# Patient Record
Sex: Female | Born: 1965 | Race: Black or African American | Hispanic: No | Marital: Single | Smoking: Never smoker
Health system: Southern US, Community
[De-identification: ages and names within clinical notes are randomized; demographics above are authoritative.]

## PROBLEM LIST (undated history)

## (undated) DIAGNOSIS — K219 Gastro-esophageal reflux disease without esophagitis: Secondary | ICD-10-CM

## (undated) DIAGNOSIS — M0520 Rheumatoid vasculitis with rheumatoid arthritis of unspecified site: Secondary | ICD-10-CM

## (undated) DIAGNOSIS — J45909 Unspecified asthma, uncomplicated: Secondary | ICD-10-CM

## (undated) HISTORY — PX: ABDOMINAL HYSTERECTOMY: SHX81

---

## 2000-09-14 ENCOUNTER — Ambulatory Visit (HOSPITAL_COMMUNITY): Admission: RE | Admit: 2000-09-14 | Discharge: 2000-09-14 | Payer: Self-pay | Admitting: Rheumatology

## 2002-06-18 ENCOUNTER — Encounter: Payer: Self-pay | Admitting: Emergency Medicine

## 2002-06-18 ENCOUNTER — Emergency Department (HOSPITAL_COMMUNITY): Admission: EM | Admit: 2002-06-18 | Discharge: 2002-06-18 | Payer: Self-pay | Admitting: Emergency Medicine

## 2003-09-10 ENCOUNTER — Other Ambulatory Visit: Admission: RE | Admit: 2003-09-10 | Discharge: 2003-09-10 | Payer: Self-pay | Admitting: Family Medicine

## 2003-09-13 ENCOUNTER — Ambulatory Visit (HOSPITAL_COMMUNITY): Admission: RE | Admit: 2003-09-13 | Discharge: 2003-09-13 | Payer: Self-pay | Admitting: Family Medicine

## 2003-09-14 ENCOUNTER — Ambulatory Visit (HOSPITAL_COMMUNITY): Admission: RE | Admit: 2003-09-14 | Discharge: 2003-09-14 | Payer: Self-pay | Admitting: Family Medicine

## 2003-12-11 ENCOUNTER — Encounter: Admission: RE | Admit: 2003-12-11 | Discharge: 2003-12-11 | Payer: Self-pay | Admitting: Rheumatology

## 2004-03-03 ENCOUNTER — Ambulatory Visit (HOSPITAL_COMMUNITY): Admission: RE | Admit: 2004-03-03 | Discharge: 2004-03-03 | Payer: Self-pay | Admitting: Obstetrics and Gynecology

## 2004-04-16 ENCOUNTER — Encounter: Admission: RE | Admit: 2004-04-16 | Discharge: 2004-04-16 | Payer: Self-pay | Admitting: Obstetrics and Gynecology

## 2004-04-18 ENCOUNTER — Encounter (INDEPENDENT_AMBULATORY_CARE_PROVIDER_SITE_OTHER): Payer: Self-pay | Admitting: Specialist

## 2004-04-18 ENCOUNTER — Inpatient Hospital Stay (HOSPITAL_COMMUNITY): Admission: RE | Admit: 2004-04-18 | Discharge: 2004-04-20 | Payer: Self-pay | Admitting: Obstetrics and Gynecology

## 2004-05-03 ENCOUNTER — Inpatient Hospital Stay (HOSPITAL_COMMUNITY): Admission: AD | Admit: 2004-05-03 | Discharge: 2004-05-03 | Payer: Self-pay | Admitting: Obstetrics and Gynecology

## 2005-04-16 ENCOUNTER — Other Ambulatory Visit: Admission: RE | Admit: 2005-04-16 | Discharge: 2005-04-16 | Payer: Self-pay | Admitting: Obstetrics and Gynecology

## 2005-11-10 ENCOUNTER — Encounter: Admission: RE | Admit: 2005-11-10 | Discharge: 2005-11-10 | Payer: Self-pay | Admitting: Rheumatology

## 2006-02-09 ENCOUNTER — Ambulatory Visit: Payer: Self-pay | Admitting: Family Medicine

## 2006-05-21 ENCOUNTER — Other Ambulatory Visit: Admission: RE | Admit: 2006-05-21 | Discharge: 2006-05-21 | Payer: Self-pay | Admitting: Obstetrics and Gynecology

## 2006-09-21 ENCOUNTER — Ambulatory Visit: Payer: Self-pay | Admitting: Family Medicine

## 2006-09-23 ENCOUNTER — Ambulatory Visit: Payer: Self-pay | Admitting: Family Medicine

## 2007-01-18 ENCOUNTER — Ambulatory Visit: Payer: Self-pay | Admitting: Family Medicine

## 2007-07-28 DIAGNOSIS — M159 Polyosteoarthritis, unspecified: Secondary | ICD-10-CM | POA: Insufficient documentation

## 2007-08-25 ENCOUNTER — Encounter (INDEPENDENT_AMBULATORY_CARE_PROVIDER_SITE_OTHER): Payer: Self-pay | Admitting: Family Medicine

## 2007-10-06 ENCOUNTER — Encounter (INDEPENDENT_AMBULATORY_CARE_PROVIDER_SITE_OTHER): Payer: Self-pay | Admitting: Obstetrics and Gynecology

## 2007-10-06 ENCOUNTER — Ambulatory Visit (HOSPITAL_COMMUNITY): Admission: RE | Admit: 2007-10-06 | Discharge: 2007-10-06 | Payer: Self-pay | Admitting: Obstetrics and Gynecology

## 2007-10-14 ENCOUNTER — Telehealth (INDEPENDENT_AMBULATORY_CARE_PROVIDER_SITE_OTHER): Payer: Self-pay | Admitting: Family Medicine

## 2007-11-07 ENCOUNTER — Ambulatory Visit: Payer: Self-pay | Admitting: Family Medicine

## 2007-11-09 ENCOUNTER — Ambulatory Visit: Payer: Self-pay | Admitting: Family Medicine

## 2007-12-07 ENCOUNTER — Encounter (INDEPENDENT_AMBULATORY_CARE_PROVIDER_SITE_OTHER): Payer: Self-pay | Admitting: Family Medicine

## 2008-02-07 ENCOUNTER — Encounter (INDEPENDENT_AMBULATORY_CARE_PROVIDER_SITE_OTHER): Payer: Self-pay | Admitting: Family Medicine

## 2008-03-08 ENCOUNTER — Encounter (INDEPENDENT_AMBULATORY_CARE_PROVIDER_SITE_OTHER): Payer: Self-pay | Admitting: Family Medicine

## 2008-06-11 ENCOUNTER — Encounter: Admission: RE | Admit: 2008-06-11 | Discharge: 2008-06-11 | Payer: Self-pay | Admitting: Rheumatology

## 2008-06-11 ENCOUNTER — Encounter (INDEPENDENT_AMBULATORY_CARE_PROVIDER_SITE_OTHER): Payer: Self-pay | Admitting: Family Medicine

## 2008-06-27 ENCOUNTER — Encounter: Admission: RE | Admit: 2008-06-27 | Discharge: 2008-06-27 | Payer: Self-pay | Admitting: Rheumatology

## 2008-09-19 ENCOUNTER — Encounter (INDEPENDENT_AMBULATORY_CARE_PROVIDER_SITE_OTHER): Payer: Self-pay | Admitting: Family Medicine

## 2008-09-19 ENCOUNTER — Encounter: Admission: RE | Admit: 2008-09-19 | Discharge: 2008-09-19 | Payer: Self-pay | Admitting: Rheumatology

## 2009-01-08 ENCOUNTER — Encounter (INDEPENDENT_AMBULATORY_CARE_PROVIDER_SITE_OTHER): Payer: Self-pay | Admitting: Family Medicine

## 2009-07-12 ENCOUNTER — Encounter: Payer: Self-pay | Admitting: Physician Assistant

## 2009-07-12 ENCOUNTER — Telehealth: Payer: Self-pay | Admitting: Physician Assistant

## 2009-07-15 ENCOUNTER — Encounter (INDEPENDENT_AMBULATORY_CARE_PROVIDER_SITE_OTHER): Payer: Self-pay | Admitting: *Deleted

## 2009-09-12 ENCOUNTER — Encounter: Payer: Self-pay | Admitting: Physician Assistant

## 2009-09-15 ENCOUNTER — Encounter: Payer: Self-pay | Admitting: Physician Assistant

## 2009-09-15 DIAGNOSIS — M069 Rheumatoid arthritis, unspecified: Secondary | ICD-10-CM | POA: Insufficient documentation

## 2009-12-12 ENCOUNTER — Encounter: Payer: Self-pay | Admitting: Physician Assistant

## 2010-02-04 ENCOUNTER — Telehealth: Payer: Self-pay | Admitting: Physician Assistant

## 2010-02-04 ENCOUNTER — Ambulatory Visit: Payer: Self-pay | Admitting: Physician Assistant

## 2010-02-04 DIAGNOSIS — R059 Cough, unspecified: Secondary | ICD-10-CM | POA: Insufficient documentation

## 2010-02-04 DIAGNOSIS — R05 Cough: Secondary | ICD-10-CM

## 2010-02-12 ENCOUNTER — Encounter: Payer: Self-pay | Admitting: Physician Assistant

## 2010-02-12 LAB — CONVERTED CEMR LAB
ALT: 18 units/L
AST: 24 units/L
Albumin: 3.9 g/dL
Alkaline Phosphatase: 53 units/L
Anion Gap: 11.2
BUN: 9 mg/dL
CO2: 26 meq/L
Chloride: 105 meq/L
Creatinine, Ser: 0.7 mg/dL
GFR calc Af Amer: 115.51 mL/min
GFR calc non Af Amer: 91.33 mL/min
Glucose, Bld: 101 mg/dL
HCT: 36.7 %
Hemoglobin: 12.2 g/dL
MCV: 92.5 fL
Platelets: 314 10*3/uL
Potassium: 4.2 meq/L
RBC: 3.97 M/uL
RDW: 12.3 %
Sed Rate: 45 mm/hr
Sodium: 138 meq/L
Total Bilirubin: 0.6 mg/dL
Total Protein: 8.1 g/dL
Vit D, 25-Hydroxy: 9 ng/mL
WBC: 4.7 10*3/uL

## 2010-02-16 ENCOUNTER — Encounter: Payer: Self-pay | Admitting: Physician Assistant

## 2010-02-16 DIAGNOSIS — E559 Vitamin D deficiency, unspecified: Secondary | ICD-10-CM | POA: Insufficient documentation

## 2010-03-13 ENCOUNTER — Encounter (INDEPENDENT_AMBULATORY_CARE_PROVIDER_SITE_OTHER): Payer: Self-pay | Admitting: *Deleted

## 2010-03-13 ENCOUNTER — Telehealth: Payer: Self-pay | Admitting: Physician Assistant

## 2010-04-09 ENCOUNTER — Encounter: Payer: Self-pay | Admitting: Physician Assistant

## 2010-04-30 ENCOUNTER — Telehealth: Payer: Self-pay | Admitting: Physician Assistant

## 2010-05-06 ENCOUNTER — Encounter (INDEPENDENT_AMBULATORY_CARE_PROVIDER_SITE_OTHER): Payer: Self-pay | Admitting: *Deleted

## 2010-05-20 ENCOUNTER — Ambulatory Visit: Payer: Self-pay | Admitting: Physician Assistant

## 2010-05-20 DIAGNOSIS — M76899 Other specified enthesopathies of unspecified lower limb, excluding foot: Secondary | ICD-10-CM | POA: Insufficient documentation

## 2010-07-09 ENCOUNTER — Encounter: Payer: Self-pay | Admitting: Physician Assistant

## 2010-10-08 ENCOUNTER — Encounter (INDEPENDENT_AMBULATORY_CARE_PROVIDER_SITE_OTHER): Payer: Self-pay | Admitting: Nurse Practitioner

## 2010-10-21 ENCOUNTER — Encounter (INDEPENDENT_AMBULATORY_CARE_PROVIDER_SITE_OTHER): Payer: Self-pay | Admitting: Nurse Practitioner

## 2010-11-07 ENCOUNTER — Encounter (INDEPENDENT_AMBULATORY_CARE_PROVIDER_SITE_OTHER): Payer: Self-pay | Admitting: Nurse Practitioner

## 2010-11-16 ENCOUNTER — Encounter: Payer: Self-pay | Admitting: Obstetrics and Gynecology

## 2010-11-16 ENCOUNTER — Encounter: Payer: Self-pay | Admitting: Occupational Therapy

## 2010-11-25 NOTE — Miscellaneous (Signed)
  Clinical Lists Changes  Observations: Added new observation of VIT D 25-OH: 9.0 ng/mL (02/12/2010 22:06) Added new observation of ESR: 45 mm/hr (02/12/2010 22:06) Added new observation of ALBUMIN: 3.9 g/dL (16/07/9603 54:09) Added new observation of PROTEIN, TOT: 8.1 g/dL (81/19/1478 29:56) Added new observation of SGPT (ALT): 18 units/L (02/12/2010 22:06) Added new observation of SGOT (AST): 24 units/L (02/12/2010 22:06) Added new observation of ALK PHOS: 53 units/L (02/12/2010 22:06) Added new observation of BILI TOTAL: 0.6 mg/dL (21/30/8657 84:69) Added new observation of GFRAA: 115.51 mL/min (02/12/2010 22:06) Added new observation of GFR: 91.33 mL/min (02/12/2010 22:06) Added new observation of CREATININE: 0.70 mg/dL (62/95/2841 32:44) Added new observation of BUN: 9 mg/dL (10/28/7251 66:44) Added new observation of BG RANDOM: 101 mg/dL (03/47/4259 56:38) Added new observation of ANION GAP: 11.2  (02/12/2010 22:06) Added new observation of CO2 PLSM/SER: 26 meq/L (02/12/2010 22:06) Added new observation of CL SERUM: 105 meq/L (02/12/2010 22:06) Added new observation of K SERUM: 4.2 meq/L (02/12/2010 22:06) Added new observation of NA: 138 meq/L (02/12/2010 22:06) Added new observation of PLATELETK/UL: 314 K/uL (02/12/2010 22:06) Added new observation of RDW: 12.3 % (02/12/2010 22:06) Added new observation of MCV: 92.5 fL (02/12/2010 22:06) Added new observation of HCT: 36.7 % (02/12/2010 22:06) Added new observation of HGB: 12.2 g/dL (75/64/3329 51:88) Added new observation of RBC M/UL: 3.97 M/uL (02/12/2010 22:06) Added new observation of WBC COUNT: 4.7 10*3/microliter (02/12/2010 22:06)  Appended Document:     Clinical Lists Changes  Observations: Added new observation of LDL: 125 mg/dL (41/66/0630 16:01) Added new observation of HDL: 39 mg/dL (09/32/3557 32:20) Added new observation of TRIGLYC TOT: 86 mg/dL (25/42/7062 37:62) Added new observation of CHOLESTEROL: 184  mg/dL (83/15/1761 60:73)

## 2010-11-25 NOTE — Assessment & Plan Note (Signed)
Summary: Right hip pain (bursitis)   Vital Signs:  Patient profile:   45 year old female Height:      65 inches Weight:      208 pounds BMI:     34.74 Temp:     98.3 degrees F oral Pulse rate:   84 / minute Pulse rhythm:   regular Resp:     18 per minute BP sitting:   129 / 82  (left arm) Cuff size:   regular  Vitals Entered By: [name] (May 20, 2010 3:03 PM) CC: pt is here swelling in hips down to feet...Marland KitchenMarland Kitchen pt says rheuma gave her an ointment but not helping.... ibuprofen dose has increased for pain...  pt says she would like fluid pills... Is Patient Diabetic? No Pain Assessment Patient in pain? no       Does patient need assistance? Functional Status Self care Ambulation Normal   Primary Care Provider:  Tereso Newcomer PA-C  CC:  pt is here swelling in hips down to feet...Marland KitchenMarland Kitchen pt says rheuma gave her an ointment but not helping.... ibuprofen dose has increased for pain...  pt says she would like fluid pills....  History of Present Illness: Here for right hip pain.  Saw Dr. Nickola Major her rheumatologist several weeks ago and was given Ibuprofen 800 mg three times a day and Voltaren gel.  The notes state that she would give her an injection if it did not respond.  Kathy [name] states that her pain continues and radiates down to her knee.  She denies any pedal edema.  Denies any dyspnea.  She gets her MTX labs with Dr. Nickola Major.  She is concerned about getting a steroid injection.  Thinks it will cause side effects like she had with oral prednisone.  She is inquiring whether or not she should take fluid pills.  Current Medications (verified): 1)  Methotrexate 2.5 Mg Tabs (Methotrexate Sodium) .... Take Six Tabs By Mouth Weekly 2)  Lab Draw .... Please Draw:  Lipids, Vitamin D and Fax To [facility] 3)  Cetirizine Hcl 10 Mg Tabs (Cetirizine Hcl) .... Take 1 Tablet By Mouth Once A Day As Needed 4)  Tessalon Perles 100 Mg Caps (Benzonatate) .... Take 1 Tablet By Mouth Three  Times A Day As Needed For Cough 5)  Ergocalciferol 50000 Unit Caps (Ergocalciferol) .... Take 1 Cap Once A Week For 12 Weeks.  Allergies (verified): 1)  ! Aspirin  Physical Exam  General:  alert, well-developed, and well-nourished.   Head:  normocephalic and atraumatic.   Lungs:  normal breath sounds.   Heart:  normal rate and regular rhythm.   Msk:  + pain over right greater trochanter with palp  Extremities:  no pedal edema  Neurologic:  alert & oriented X3 and cranial nerves II-XII intact.   Psych:  normally interactive.     Impression & Recommendations:  Problem # 1:  VITAMIN D DEFICIENCY (ICD-268.9) never got Vitamin D reprint Rx and check levels in 3 mos  Problem # 2:  TROCHANTERIC BURSITIS, RIGHT (ICD-726.5) agree with Dr. Nickola Major she should have an injection for treatment I spent some time today explaining that the steroid injection will not cause the side effects she is worried about I will give her naproxen to use two times a day to see if this is better than the ibuprofen otherwise, I rec she f/u with Dr.Hawkes I see no reason for her to take a diuretic  Complete Medication List: 1)  Methotrexate 2.5  Mg Tabs (Methotrexate sodium) .... Take six tabs by mouth weekly 2)  Cetirizine Hcl 10 Mg Tabs (Cetirizine hcl) .... Take 1 tablet by mouth once a day as needed 3)  Ergocalciferol 50000 Unit Caps (Ergocalciferol) .... Take 1 cap once a week for 12 weeks. 4)  Omeprazole 20 Mg Cpdr (Omeprazole) .... Take 1 tablet by mouth once a day 5)  Naproxen 500 Mg Tabs (Naproxen) .... Take 1 tablet by mouth two times a day with food for 2 weeks  Patient Instructions: 1)  Take Vitamin D (ergocalciferol) once a week for 12 weeks. 2)  Please schedule a follow-up appointment in 3-4 months with the lab for Vitamin D level.  3)  Stop the Ibuprofen and take Naprosyn (Naproxen) 500 mg two times a day for 2 weeks.  Take with food. 4)  I suggest you schedule a follow up with Dr. Nickola Major and  consider getting the bursa injected.  This is often the most effective treatment for bursitis.  The side effects of altering mood and weight gain are not felt with steroid injections because the medicine is retained mainly in the area it is injected. Prescriptions: NAPROXEN 500 MG TABS (NAPROXEN) Take 1 tablet by mouth two times a day with food for 2 weeks  #30 x 0   Entered and Authorized by:   Tereso Newcomer PA-C   Signed by:   Tereso Newcomer PA-C on 05/20/2010   Method used:   Print then Give to Patient   RxID:   [id number] ERGOCALCIFEROL 50000 UNIT CAPS (ERGOCALCIFEROL) Take 1 cap once a week for 12 weeks.  #12 x 0   Entered and Authorized by:   Tereso Newcomer PA-C   Signed by:   Tereso Newcomer PA-C on 05/20/2010   Method used:   Reprint   RxID:   [id number]

## 2010-11-25 NOTE — Letter (Signed)
Summary: RHEUMATOID NOTES  RHEUMATOID NOTES   Imported By: Arta Bruce 10/31/2009 15:34:57  _____________________________________________________________________  External Attachment:    Type:   Image     Comment:   External Document

## 2010-11-25 NOTE — Letter (Signed)
Summary: RHEMATOLOGY NOTES  RHEMATOLOGY NOTES   Imported By: Arta Bruce 02/18/2010 14:27:45  _____________________________________________________________________  External Attachment:    Type:   Image     Comment:   External Document

## 2010-11-25 NOTE — Letter (Signed)
Summary: *HSN Results Follow up  [facility]  [address] [place], [place] [address]   Phone: [phone]  Fax: [phone]      03/13/2010   Kathy [name] [address] [place], [place]  [address]   Dear  [name],                            ____S.Drinkard,FNP   ____D. Gore,FNP       ____B. McPherson,MD   ____V. Rankins,MD    ____E. Mulberry,MD    ____N. Daphine Deutscher, FNP  ____D. Reche Dixon, MD    ____K. Philipp Deputy, MD    ____Other     This letter is to inform you that your recent test(s):  _______Pap Smear    _______Lab Test     _______X-ray    _______ is within acceptable limits  _______ requires a medication change  ____X___ requires a follow-up lab visit  _______ requires a follow-up visit with your provider   Comments:  We have been trying to reach you.  Please give me a call at your earliest convenience.       _________________________________________________________ If you have any questions, please contact our office                     Sincerely,  [name] [facility]

## 2010-11-25 NOTE — Assessment & Plan Note (Signed)
Summary: NEEDS GYN REFERRAL/[facility]//[place]   Vital Signs:  Patient profile:   45 year old female Height:      65 inches Weight:      211 pounds BMI:     35.24 Temp:     98.5 degrees F oral Pulse rate:   80 / minute Pulse rhythm:   regular Resp:     18 per minute BP sitting:   118 / 82  (left arm) Cuff size:   regular  Vitals Entered By: [name] (February 04, 2010 4:01 PM) CC: pt wants a referral GYN.Marland Kitchen   pt wants a med for acid reflux... Is Patient Diabetic? No Pain Assessment Patient in pain? no       Does patient need assistance? Functional Status Self care Ambulation Normal   Primary Care Provider:  Tereso Newcomer PA-C  CC:  pt wants a referral GYN.Marland Kitchen   pt wants a med for acid reflux....  History of Present Illness: Here to re-establish. Has been following with Dr. Nickola Major for her RA.  She is on MTX.  Dr. Nickola Major draws labs for her MTX and last had blood drawn last week.  She needs referral back to [facility] for f/u.  Also due for mammogram.  Has FHx breast cancer.  Cough:  She is c/o cough for about 2 weeks.  No fever.  No production.  No sore throat or otalgia.  No sensation of postnasal drip.  No sneezing.  No congestion.  No dyspnea.  She does note some stress incontinence with cough at times.  Had URI.  Cough lingered after.    Habits & Providers  Alcohol-Tobacco-Diet     Tobacco Status: never  Exercise-Depression-Behavior     Drug Use: no  Current Medications (verified): 1)  Ibuprofen 800 Mg  Tabs (Ibuprofen) 2)  Methotrexate 2.5 Mg Tabs (Methotrexate Sodium) .... Take Six Tabs By Mouth Weekly  Allergies (verified): 1)  ! Aspirin  Past History:  Past Medical History: Reviewed history from 09/15/2009 and no changes required. Rheumatoid arthritis   a.  f/u by Dr. Lennox Pippins - now Dr. Zenovia Jordan at [place]; intol. to MTX; no response to Gold or Plaquenil;        h/o prednisone tx and intermittent short courses; takes NSAIDs; last seen by  rheum. 9.14.2010   b.  Patient agreed to trying methotrexate again at 09/09/2009 visit with Dr. Nickola Major  Past Surgical History: h/o DUB with hysterectomy 2008 ovarian cyst surgery in 2010  Family History: Family History Diabetes 1st degree relative - mom Heart problems - mom Family History Breast cancer 1st degree relative <50 - sister Family History Hypertension - mom Lupus - sister Hepatic CA - dad No colon cancer.  Social History: Disabled from RA Single 1 child (adult now) Never Smoked Alcohol use-no Drug use-no Smoking Status:  never Drug Use:  no  Review of Systems  The patient denies fever, chest pain, syncope, dyspnea on exertion, melena, hematochezia, and hematuria.         See HPI  Physical Exam  General:  alert, well-developed, and well-nourished.   Head:  normocephalic and atraumatic.   Eyes:  pupils equal, pupils round, and pupils reactive to light.   Ears:  R ear normal and L ear normal.   Nose:  no external deformity.   Mouth:  pharynx pink and moist.   Neck:  supple.   Lungs:  normal breath sounds, no crackles, and no wheezes.   Heart:  normal rate  and regular rhythm.   Msk:  rheumatoid nodule noted right wrist some ulnar deviation noted of metacarpals on right  Neurologic:  alert & oriented X3 and cranial nerves II-XII intact.   Psych:  normally interactive.     Impression & Recommendations:  Problem # 1:  Preventive Health Care (ICD-V70.0)  needs referral back to Dr. Osborn Coho for Gyn for her medicaid had Td in 2008 up to date on pneumovax will ask to get Vit D and Lipids with next blood draw at rheumatologist's office   Orders: Gynecologic Referral (Gyn) Mammogram (Screening) (Mammo)  Problem # 2:  COUGH (ICD-786.2) prob airway hyperreactivity from recent URI antihistamine proventil as needed tessalon perles as needed f/u as needed kegels given for stress incontinence  Problem # 3:  RHEUMATOID ARTHRITIS (ICD-714.0) f/u  with Dr. Nickola Major  The following medications were removed from the medication list:    Ibuprofen 800 Mg Tabs (Ibuprofen) Her updated medication list for this problem includes:    Methotrexate 2.5 Mg Tabs (Methotrexate sodium) .Marland Kitchen... Take six tabs by mouth weekly  Complete Medication List: 1)  Methotrexate 2.5 Mg Tabs (Methotrexate sodium) .... Take six tabs by mouth weekly 2)  Lab Draw  .... Please draw:  lipids, vitamin d and [facility] to [facility] 3)  Cetirizine Hcl 10 Mg Tabs (Cetirizine hcl) .... Take 1 tablet by mouth once a day as needed 4)  Tessalon Perles 100 Mg Caps (Benzonatate) .... Take 1 tablet by mouth three times a day as needed for cough 5)  Proventil Hfa 108 (90 Base) Mcg/act Aers (Albuterol sulfate) .Marland Kitchen.. 1-2 puffs every 4-6 hours as needed  Patient Instructions: 1)  Go to your next blood draw with Dr. Nickola Major fasting for cholesterol check.  I will also check your Vitamin D.   2)  Do not eat or drink anything after midnight except water. 3)  Someone will call you about the referral to Dr. Su Hilt. 4)  Use the cetirizine once daily for 2-3 days.  Use the proventil as needed and the tessalon perles as needed. 5)  See kegel exercises. Prescriptions: PROVENTIL HFA 108 (90 BASE) MCG/ACT AERS (ALBUTEROL SULFATE) 1-2 puffs every 4-6 hours as needed  #1 x 0   Entered and Authorized by:   Tereso Newcomer PA-C   Signed by:   Tereso Newcomer PA-C on 02/04/2010   Method used:   Print then Give to Patient   RxID:   [id number] TESSALON PERLES 100 MG CAPS (BENZONATATE) Take 1 tablet by mouth three times a day as needed for cough  #30 x 1   Entered and Authorized by:   Tereso Newcomer PA-C   Signed by:   Tereso Newcomer PA-C on 02/04/2010   Method used:   Print then Give to Patient   RxID:   [id number] CETIRIZINE HCL 10 MG TABS (CETIRIZINE HCL) Take 1 tablet by mouth once a day as needed  #30 x 1   Entered and Authorized by:   Tereso Newcomer PA-C   Signed by:   Tereso Newcomer PA-C on  02/04/2010   Method used:   Print then Give to Patient   RxID:   [id number] LAB DRAW please draw:  Lipids, Vitamin D and fax to Tereso Newcomer, PA-C  #0 x 0   Entered and Authorized by:   Tereso Newcomer PA-C   Signed by:   Tereso Newcomer PA-C on 02/04/2010   Method used:   Print then Give to Patient   RxID:  [id number]    Appended Document: NEEDS GYN REFERRAL/[facility] PAT[facility]//[facility] LDL 125 . . . goal > 130 HDL 39 . . . have patient start Fish Oil 1000 mg 2 caps once daily. Repeat fasting Lipids and LFTs in 6 mos. Vit D is low and needs replacement Repeat 25 Hydroxyvitamin D level in 3 mos.    Left message on answering machine for pt to call back.Marland KitchenMarland KitchenMarland KitchenArmenia Shannon  February 17, 2010 3:43 PM  Pt notified,  she uses TEFL teacher........... Elmarie Shiley McCoy CMA  February 17, 2010 4:06 PM   Clinical Lists Changes  Problems: Added new problem of VITAMIN D DEFICIENCY (ICD-268.9) - Signed Medications: Added new medication of ERGOCALCIFEROL 50000 UNIT CAPS (ERGOCALCIFEROL) Take 1 cap once a week for 12 weeks. - Signed Rx of ERGOCALCIFEROL 50000 UNIT CAPS (ERGOCALCIFEROL) Take 1 cap once a week for 12 weeks.;  #12 x 0;  Signed;  Entered by: Tereso Newcomer PA-C;  Authorized by: Tereso Newcomer PA-C;  Method used: Print then Give to Patient    Prescriptions: ERGOCALCIFEROL 50000 UNIT CAPS (ERGOCALCIFEROL) Take 1 cap once a week for 12 weeks.  #12 x 0   Entered and Authorized by:   Tereso Newcomer PA-C   Signed by:   Tereso Newcomer PA-C on 02/16/2010   Method used:   Print then Give to Patient   RxID:   [id number]      Impression & Recommendations:  Problem # 1:  VITAMIN D DEFICIENCY (ICD-268.9)  Complete Medication List: 1)  Methotrexate 2.5 Mg Tabs (Methotrexate sodium) .... Take six tabs by mouth weekly 2)  Lab Draw  .... Please draw:  lipids, vitamin d and fax to [facility] 3)  Cetirizine Hcl 10 Mg Tabs (Cetirizine hcl) .... Take 1 tablet by mouth once a day as  needed 4)  Tessalon Perles 100 Mg Caps (Benzonatate) .... Take 1 tablet by mouth three times a day as needed for cough 5)  Proventil Hfa 108 (90 Base) Mcg/act Aers (Albuterol sulfate) .Marland Kitchen.. 1-2 puffs every 4-6 hours as needed 6)  Ergocalciferol 50000 Unit Caps (Ergocalciferol) .... Take 1 cap once a week for 12 weeks.

## 2010-11-25 NOTE — Letter (Signed)
Summary: *HSN Results Follow up  [facility]  [address] [place], [place] [address]   Phone: [phone]  Fax: [phone]      05/06/2010   Kathy [name] [address] [place], [place]  [address]   Dear  [name],                            ____S.Drinkard,FNP   ____D. Gore,FNP       ____B. McPherson,MD   ____V. Rankins,MD    ____E. Mulberry,MD    ____N. Daphine Deutscher, FNP  ____D. Reche Dixon, MD    ____K. Philipp Deputy, MD    ____Other     This letter is to inform you that your recent test(s):  _______Pap Smear    _______Lab Test     _______X-ray    _______ is within acceptable limits  _______ requires a medication change  _______ requires a follow-up lab visit  __X_____ requires a follow-up visit with your provider   Comments:We have been trying to reach you.  Please give the office a call back at your earliest convenience.       _________________________________________________________ If you have any questions, please contact our office                     Sincerely,  Kathy [name] [facility]

## 2010-11-25 NOTE — Letter (Signed)
Summary: RHEUMATOLOGY NOTES  RHEUMATOLOGY NOTES   Imported By: Arta Bruce 05/14/2010 11:06:31  _____________________________________________________________________  External Attachment:    Type:   Image     Comment:   External Document

## 2010-11-25 NOTE — Progress Notes (Signed)
Summary: Gyn referral  Phone Note Outgoing Call   Summary of Call: Needs referral back to Gyn for f/u. Referral in system.  Dr. Osborn Coho. Initial call taken by: Brynda Rim,  February 04, 2010 5:08 PM

## 2010-11-25 NOTE — Letter (Signed)
Summary: [facility] //OFFICE VISIT  [facility] //OFFICE VISIT   Imported By: Arta Bruce 08/01/2010 11:47:10  _____________________________________________________________________  External Attachment:    Type:   Image     Comment:   External Document

## 2010-11-25 NOTE — Progress Notes (Signed)
Summary: fyi  Phone Note Outgoing Call   Summary of Call: med vitamin d called in to pharmacy but pt never picked up Initial call taken by: [name],  Mar 13, 2010 11:42 AM     Appended Document: Orders Update Please remind her about prescription and need to recheck Vit D levels in 3 mos. Tereso Newcomer PA-C  Mar 13, 2010 11:55 AM  number is disconnected.Marland KitchenMarland KitchenMarland KitchenMarland Kitchen will mail letter... [name]  Mar 13, 2010 3:19 PM   Clinical Lists Changes

## 2010-11-25 NOTE — Letter (Signed)
Summary: RHEUMATOID NOTES  RHEUMATOID NOTES   Imported By: Arta Bruce 05/05/2010 16:19:50  _____________________________________________________________________  External Attachment:    Type:   Image     Comment:   External Document

## 2010-11-25 NOTE — Letter (Signed)
[facility]: [facility]   Imported By: Arta Bruce 04/14/2010 16:26:38  _____________________________________________________________________  External Attachment:    Type:   Image     Comment:   External Document

## 2010-11-25 NOTE — Letter (Signed)
Summary: RHEUMATOLOGY NOTES  RHEUMATOLOGY NOTES   Imported By: Arta Bruce 02/19/2010 12:41:53  _____________________________________________________________________  External Attachment:    Type:   Image     Comment:   External Document

## 2010-11-25 NOTE — Progress Notes (Signed)
Summary: NEEDS FLUID MEDICATIONS  Phone Note Call from Patient Call back at Home Phone [phone]   Summary of Call: Kathy [name] PT. MS Murtaugh CALLED, BECAUSE HER RHUMATOLOGIST, DR HAWKS WANTS HER TO BE ON FLUID PILLS, AND WANTS [name] TO PRESCRIBE THEM. [name] USES [facility]. THE NUMBER IS [phone]. Initial call taken by: Leodis Rains,  April 30, 2010 4:03 PM  Follow-up for Phone Call        I need some more information. Please request last office note from her rheumatologist. Put on my desk  or hand to me when it comes; not on shelf.  Follow-up by: Tereso Newcomer PA-C,  April 30, 2010 5:40 PM  Additional Follow-up for Phone Call Additional follow up Details #1::        the customer you are trying to reach phone is off or their unavailable... [name]  May 01, 2010 8:46 AM left [place] office a message.. [name]  May 01, 2010 8:46 AM spoke with dr. Lendon Colonel nurse and she says she will send [place] notes and she says the pt mention leg swelling so Dr. Lendon Colonel told pt to contact us to let us know and that you Might prescribe that.....  Additional Follow-up by: [name],  May 01, 2010 11:53 AM    Additional Follow-up for Phone Call Additional follow up Details #2::    Reviewed notes from Dr. Nickola Major. I will need her to see me before I give her fluid meds.  Schedule an appt.  Follow-up by: Tereso Newcomer PA-C,  May 02, 2010 4:26 PM  Additional Follow-up for Phone Call Additional follow up Details #3:: Details for Additional Follow-up Action Taken: the customer you are trying to reach phone is off or their unavailable.Marland KitchenMarland KitchenMarland KitchenArmenia Shannon  May 06, 2010 12:27 PM will mail letter.... [name]  May 06, 2010 12:27 PM

## 2010-11-27 NOTE — Miscellaneous (Signed)
Summary: Med update  Clinical Lists Changes  Medications: Added new medication of PLAQUENIL 200 MG TABS (HYDROXYCHLOROQUINE SULFATE) 2 tablets by mouth daily **Rx by rheumatology**

## 2010-11-27 NOTE — Letter (Signed)
Summary: [facility]   Imported By: Arta Bruce 10/22/2010 14:44:17  _____________________________________________________________________  External Attachment:    Type:   Image     Comment:   External Document

## 2010-11-27 NOTE — Letter (Signed)
Summary: [facility]OFFICE VIS[facility]  [facility]   Imported By: Arta Bruce 11/20/2010 09:55:20  _____________________________________________________________________  External Attachment:    Type:   Image     Comment:   External Document

## 2011-03-10 NOTE — H&P (Signed)
NAME:  Kathy [name], Kathy [name] NO.:  [id number]   MEDICAL RECORD NO.:  [id number]          PATIENT TYPE:  AMB   LOCATION:  SDC                           FACILITY:  [facility]   PHYSICIAN:  Osborn Coho, M.D.   DATE OF BIRTH:  Mar 03, 1966   DATE OF ADMISSION:  10/07/2007  DATE OF DISCHARGE:                              HISTORY & PHYSICAL   HISTORY OF PRESENT ILLNESS:  Kathy [name] is a 45 year old single African-  American female, para 1-0-0-1, who is status post hysterectomy  presenting for a laparoscopic ovarian cystectomy because of pelvic pain  and a persistent complex right ovarian cyst.  For the past 4 months, the  patient reports a crampy episodic right-sided pelvic pain lasting  approximately 2 hours at a time and initially was responsive to  ibuprofen.  Later, however, the patient's pain would only be relieved  with narcotic analgesia.  She denies any increasing factors, vaginitis  symptoms, fever, changes in her bowel movements, nausea or vomiting.  Her initial ultrasound in September 2008 showed a complex right ovarian  cyst measuring 3.8 x 3.3 x 3.6 cm.  A followup ultrasound in November  2008 showed an increase in the complex right ovarian cyst to 4.3 x 3.5 x  3.7 cm and an additional right simple cyst measuring 2.3 x 2.1 x 2.3 cm.  Additionally, there was a left ovarian simple cyst noted measuring 2.2 x  2.0 x 2.0 cm. Given the discomfort along with the persistence of the  complex right ovarian cyst the patient has decided to proceed with  surgical intervention for management of this cyst.   PAST MEDICAL HISTORY:   OB HISTORY:  Gravida 1, para 1-0-0-1. The patient underwent cesarean  section.   GYN HISTORY:  Menarche 45 years old. The patient has had a hysterectomy.  Denies any history of sexually transmitted diseases or abnormal Pap  smears.  Her last normal Pap smear was August 2008, normal mammogram in  2007   MEDICAL HISTORY:  Rheumatoid arthritis and   thrombocytosis   SURGICAL HISTORY:  2005, total abdominal hysterectomy because of  symptomatic fibroids.   FAMILY HISTORY:  Diabetes, asthma, breast cancer (a sister died in the  third decade), cardiovascular disease and hypertension.   SOCIAL HISTORY:  The patient is single and lives with her son.  She is  also disabled.   HABITS:  She rarely consumes alcohol.  Does not use tobacco.   CURRENT MEDICATIONS:  Ibuprofen over-the-counter as needed.   ALLERGIES:  The patient is allergic to ASPIRIN and to METHOTREXATE.   REVIEW OF SYSTEMS:  The patient does wear glasses.  She denies any chest  pain, shortness of breath, fever, weight loss, skin rashes, nausea,  vomiting, diarrhea, urinary tract symptoms, vaginitis symptoms and,  except as is mentioned in History of Present Illness, the patient's  Review of Systems is negative.   PHYSICAL EXAMINATION:  VITAL SIGNS:  Blood pressure 120/70.  Weight is  187.  Height is 5 feet 2 inches tall.  NECK:  Supple without masses.  There is no thyromegaly or cervical adenopathy.  HEART:  Regular rate and rhythm.  LUNGS:  Clear.  BACK:  No CVA tenderness.  ABDOMEN:  There is right lower quadrant tenderness without guarding.  There are no masses or organomegaly.  EXTREMITIES:  No clubbing, cyanosis or edema.  PELVIC:  EG/BUS was is within normal limits.  Vagina is rugous.  Cervix  and uterus are surgically absent.  Adnexa:  Right adnexal tenderness.  Rectovaginal:  No masses.   IMPRESSION:  1. Pelvic pain.  2. Persistent complex right ovarian cyst.  3. Left ovarian cyst.   DISPOSITION:  A discussion was held with the patient regarding the  indications for her procedure along with its risks which include but are  not limited to reaction to anesthesia, damage to adjacent organs,  infection, excessive bleeding and the possibility that her ovary may  need to be removed.  The patient verbalized understanding of this and  has consented to proceed  with laparoscopic ovarian cystectomy with the  possibility of bilateral oophorectomy at [facility]  on October 07, 2007, at 1:30 p.m.      Elmira J. Adline Peals.      Osborn Coho, M.D.  Electronically Signed    EJP/MEDQ  D:  10/04/2007  T:  10/04/2007  Job:  [id number]

## 2011-03-10 NOTE — Op Note (Signed)
NAMELILLER, YOHN NO.:  [id number]   MEDICAL RECORD NO.:  [id number]          PATIENT TYPE:  AMB   LOCATION:  SDC                           [facility]ACILITY:  [facility]   PHYSICIAN:  Ardeth Sportsman, MD     DATE OF BIRTH:  [birth year]   DATE OF PROCEDURE:  10/06/2007  DATE OF DISCHARGE:                               OPERATIVE REPORT   OPERATIVE REPORT/INTRAOPERATIVE CONSULT:   REQUESTING PHYSICIAN:  Osborn Coho, M.D.  See her operative note for  further details.   SURGEON:  Karie Soda.   PREOPERATIVE DIAGNOSIS:  Small bowel stitching, question injury.   POSTOPERATIVE DIAGNOSIS:  Small bowel stitch with no major injury   PROCEDURE PERFORMED:  Diagnostic laparoscopy.   ANESTHESIA:  General anesthesia.   SPECIMEN:  Drains.   BLOOD LOSS:  None.   DRAINS:  None.   ESTIMATED BLOOD LOSS:  None for my part of the case.   INDICATION:  Ms. Colasurdo is a 45 year old female who is undergoing an  outpatient laparoscopically assisted GYN procedure, I believe a partial  ovarian cystectomy.  Please see Dr. Su Hilt notes.  During the procedure  a Endoloop was used in attempt to ligate down the pelvis.  In doing  this, Dr. Su Hilt notes that it looks like a loop of Endoloop got stuck  on a loop of small bowel and tied down.  Basic concerns are they do not  feel comfortable safely removing it and an interrupted consult was  requested for evaluation.   FINDINGS:  There was a Vicryl stitch on the mid jejunal loop that was  not causing any significant obstruction or ischemia or necrosis.  I  elected to leave the stitch in place and alone.   PROCEDURE:  The patient was already in the OR under general anesthesia  with ports in place in the low lithotomy and reverse Trendelenburg per  Dr. Su Hilt.  She was scrubbed in on the case.  I scrubbed in and  diagnostic laparoscopy was performed.  I was able to identify the loop  of small intestine in the left lower quadrant.  I was able  to  circumferentially inspect the small bowel around this region as well as  proximally and distally.  It looks like of Vicryl stitch had pinched a  little bit of the small bowel.  However, there was no evidence of any  ischemia or necrosis.  There was no evidence of obstruction or bowel  injury or bleeding.  I could not easily see the loop of the stitch  itself because the knot was completely tied down.  I did not see any  ischemia or necrosis.  I found it would be  safest to leave it alone as opposed to trying  to dig out the stitch and  cause perhaps some more serosal tears and require more surgery and more  stitching.  Dr. Su Hilt felt reassured with my recommendation and I  returned the case to her.  Please see her operative note for details.      Ardeth Sportsman, MD  Electronically Signed  SCG/MEDQ  D:  10/06/2007  T:  10/07/2007  Job:  [id number]

## 2011-03-10 NOTE — Op Note (Signed)
[name], [name] NO.:  [id number]   MEDICAL RECORD NO.:  [id number]          PATIENT TYPE:  AMB   LOCATION:  SDC                           [facility]   PHYSICIAN:  Osborn Coho, M.D.   DATE OF BIRTH:  1966/04/29   DATE OF PROCEDURE:  10/06/2007  DATE OF DISCHARGE:                               OPERATIVE REPORT   PREOPERATIVE DIAGNOSES:  Right ovarian complex cyst.   POSTOPERATIVE DIAGNOSES:  1. Right ovarian complex cyst.  2. Lysis of adhesions.   PROCEDURE:  Partial right salpingo-oophorectomy with cystectomy.   SURGEON:  Osborn Coho, M.D.   ASSISTANT:  Marquis Lunch. Lowell Guitar, PA-C.   INTRAOPERATIVE CONSULT:  Dr. Michaell Cowing.   ANESTHESIA:  General.   FINDINGS:  Right ovarian cyst.   SPECIMENS TO PATHOLOGY:  Partial right ovary and tube with cyst.   FLUID:  2500 mL.   URINE OUTPUT:  400 mL.   ESTIMATED BLOOD LOSS:  Minimal.   COMPLICATIONS:  None.   DESCRIPTION OF PROCEDURE:  The patient was taken to the operating room  after the risks, benefits and alternatives were reviewed with the  patient.  The patient verbalized understanding and consent signed and  witnessed.  The patient was placed under general anesthesia and prepped  and draped in the normal sterile fashion.  A Foley was placed to gravity  and sponge stick placed in the vagina.  Open laparoscopy was performed  with a 10-mm incision at the umbilicus after injecting 0.25% Marcaine  approximately 10 mL.  The incision was carried down to the underlying  layer of fascia which was excised with the Metzenbaum scissors and the  intra-abdominal cavity entered. Hassan placed.  The fascia was sutured  with a pursestring stitch using #0 Vicryl.  The laparoscope was  introduced.  Multiple adhesions were noted. Attention was then turned to  left lower quadrant where a 5-mm incision was made after injecting 0.25%  Marcaine.  The 5-mm trocar was then advanced into the intra-abdominal  cavity under  direct visualization.  The same was done on the right lower  quadrant and in the suprapubic region.  The bowel was noted to be  adherent to the left adnexa which required extensive dissection and  possibly even laparotomy. Because there was only a simple cyst on that  side and the patient's discomfort was primarily on the right as well as  the complex cyst was noted to be on the right, dissection of the bowel  away from the left adnexa was not performed.  Three Endoloops were  placed around the right ovary and tube and partial right oophorectomy  and salpingectomy as well as cystectomy was performed.  The specimen was  placed in the Endopouch and removed via the 10-mm port at the umbilicus  after changing the scope over to a 5 mm which was placed into the left  lower quadrant.  While placing the third loop into the intra-abdominal  cavity, the knot was noted to snag onto the serosa of the bowel and was  unable to be cut loose.  It was difficult to  identify whether or not  anything further needed to be done, therefore an intraoperative consult  was requested of general surgery and Dr. Michaell Cowing came for the consult.  He  felt that the lumen was not obstructed and the knot was merely on the  serosa and thought it would be best to leave the knot in place rather  than trying to dissect it off which could cause further damage.  He did  not notice any areas of perforation or ischemia or necrosis and felt  that the knot was merely on the serosa and did not require removal. The  knot was made of #0 Vicryl.  This area was noted to be on the small  bowel and there were no other findings noted.  The intra-abdominal  cavity was copiously irrigated.  There was good hemostasis at the right  pedicle.  The infundibulopelvic ligament had been cauterized on that  right side after identifying the ureter which was noted to peristals  without difficulty.  All instruments were removed as well as  pneumoperitoneum  was relieved.  The #0 Vicryl stitch was tied at the  umbilicus.  The skin was reapproximated using 3-0 Monocryl via a  subcuticular stitch.  The three 5-mm incisions were repaired with  Dermabond.  Sponge, lap and needle count was correct.  The patient  tolerated the procedure well and was awaiting transfer to the recovery  room in good condition.  I asked Dr. Michaell Cowing if the patient needed to be  admitted for observation overnight and he did not think that was  necessary at this time and thought the patient could be discharged from  the recovery room as initially planned.      Osborn Coho, M.D.  Electronically Signed     AR/MEDQ  D:  10/06/2007  T:  10/07/2007  Job:  [id number]

## 2011-03-13 NOTE — Discharge Summary (Signed)
NAME:  Kathy [name], Kathy [name]                        ACCOUNT NO.:  [id number]   MEDICAL RECORD NO.:  [id number]                   PATIENT TYPE:  INP   LOCATION:  9303                                 [facility]ILITY:  [facility]   PHYSICIAN:  Osborn Coho, M.D.                DATE OF BIRTH:  09-22-1966   DATE OF ADMISSION:  04/18/2004  DATE OF DISCHARGE:  04/20/2004                                 DISCHARGE SUMMARY   DISCHARGE DIAGNOSIS:  Symptomatic fibroid uterus and menorrhagia.   OPERATION:  On the date of admission, the patient underwent a total  abdominal hysterectomy, tolerating procedure well.  The patient was found to  have a large fibroid uterus weighing 617 g, along with filmy adhesions to  the left adnexa with normal-appearing tubes and ovaries bilaterally.   HISTORY OF PRESENT ILLNESS:  Ms. Bohnet is a 45 year old female, gravida 1,  para 1, who presents for hysterectomy because of symptomatic uterine  fibroids.  Please see patient's dictated history and physical examination  for details.   PREOPERATIVE PHYSICAL EXAMINATION:  VITAL SIGNS:  Vital signs were stable.  The patient was afebrile.  Her weight is 213 pounds.  GENERAL:  General exam was within normal limits.  PELVIC EXAM:  External genitalia within normal limits.  Vagina within normal  limits.  Uterus approximately 18 weeks' size with tenderness to palpation,  no palpable adnexal masses and adnexal regions without tenderness.   HOSPITAL COURSE:  On the date of admission, the patient underwent  aforementioned procedure, tolerating it well.  Postoperative course was  marked by a very brief episode of tachycardia (heart rate of 109), however,  this spontaneously resolved.  Postoperative CBC and basic metabolic panel  were both within normal limits with the exception of patient's hemoglobin  being 11.6 (preop hemoglobin 12.8).  By postop day #2, the patient had  resumed bowel and bladder functions and was therefore deemed ready  for  discharge home.   DISCHARGE MEDICATIONS:  1. Vicodin 1-2 tablets every 4-6 hours as needed for pain.  2. Iron 325 mg 1 tablet twice daily for 6 weeks.  3. The patient was also advised to continue her other preoperative     medications.   FOLLOWUP:  The patient is to call [facility] to schedule a  postoperative visit with Dr. Osborn Coho in 6 weeks.   DISCHARGE INSTRUCTIONS:  The patient was advised to call for any temperature  greater than 101, for severe problems.  She was advised to avoid driving for  2 weeks, heavy lifting for 4 weeks and intercourse for 6 weeks.   DIET:  Patient's diet was without restriction.   FINAL PATHOLOGY:  Uterus, hysterectomy:  Uterus -- cervix with benign  ectocervical and endocervical mucosa, benign endocervical inclusion cyst,  uterine corpus with weakly proliferative endometrium and multiple intramural  leiomyomata.     Elmira J. Powell, P.A.  Osborn Coho, M.D.    EJP/MEDQ  D:  05/14/2004  T:  05/14/2004  Job:  [id number]

## 2011-03-13 NOTE — H&P (Signed)
NAME:  FORESTINE, MACHO                        ACCOUNT NO.:  [id number]   MEDICAL RECORD NO.:  [id number]                   PATIENT TYPE:  INP   LOCATION:  NA                                   [facility]   PHYSICIAN:  Osborn Coho, M.D.                DATE OF BIRTH:  29-Mar-1966   DATE OF ADMISSION:  DATE OF DISCHARGE:                                HISTORY & PHYSICAL   CHIEF COMPLAINT:  Menorrhagia or heavy menstrual cycles and fibroids.   HISTORY:  [name] is a 45 year old gravida 1, para 1, with a last  menstrual period of February 2005, on Lupron Depot since February 2005, with  large fibroid uterus, who presented with menorrhagia and complaints of  dysmenorrhea as well.  The patient stopped Yasmin earlier this year.  The  patient initially presented to me on October 15, 2003.  During that time  the patient was evaluated for menorrhagia and a negative endometrial biopsy  was noted, and TSH was normal, prolactin was normal, and initial hematocrit  was 39.5, and on ultrasound uterus was noted to be large, measuring 15 cm.   PAST OBSTETRICAL HISTORY:  C-section full-term x1 secondary to failure to  dilate.   PAST GYNECOLOGIC HISTORY:  History of regular menses but heavy for many  years.  Diagnosed with fibroids in 2004.  Denies a history of abnormal Pap  smear.  Denies a history of gonorrhea, Chlamydia, and was tested negative  for HIV in 2002.  Last mammogram was in 2004 and was within normal limits  per patient.   PAST MEDICAL HISTORY:  Rheumatoid arthritis.   MEDICATIONS:  1. Prednisone 5 mg daily started three to four months ago.  2. Ibuprofen 800 mg q.h.s. p.r.n., last took June 13.   ALLERGIES:  METHOTREXATE.   SOCIAL HISTORY:  Occasional alcohol.  Denies a history of cigarette use or  drug use.  Lives with son.   FAMILY HISTORY:  Breast cancer in a sister, died at 17 years old.  Mom's  side of the family, diabetes and hypertension.   REVIEW OF  SYSTEMS:  Denies chest pain, shortness of breath, fever, chills,  nausea, vomiting, GU problems, or GI problems.  Positive hot flashes while  on Lupron.   PHYSICAL EXAMINATION:  VITAL SIGNS:  Afebrile, vital signs stable.  Weight  213 pounds.  HEENT:  Within normal limits.  NECK:  Thyroid not enlarged.  CARDIAC:  Heart rate and rhythm are regular.  CHEST:  Clear to auscultation bilaterally.  BREASTS:  With some tenderness in the left breast in the lower portion.  There were no masses, discharge, skin changes, or nipple retraction noted,  and the right breast was within normal limits.  BACK:  No CVA tenderness.  ABDOMEN:  Soft and nontender and without masses.  EXTREMITIES:  Within normal limits.  PELVIC:  External genitalia within normal limits.  Vagina within normal  limits.  Uterus approximately 18 weeks' size uterus with tenderness to  palpation.  No palpable adnexal masses, and adnexal region nontender.   IMAGING AND LAB WORK:  Pap smear in November 2004 within normal limits, done  by Dr. Barbaraann Barthel.  Ultrasound Mar 03, 2004, showed the uterus to measure 15.1 x  7.5 cm with a large fundal fibroid measuring 8.3 x 8.7 x 7.1 cm, although  there were multiple fibroids noted on the ultrasound.  The largest fibroid  appeared larger than on a previous ultrasound, at which time it measured 7.0  x 5.5 x 6.7 cm.  The right ovary measured 2.6 x 4.5 x 2.2 cm with resolution  of a hemorrhagic cyst previously seen.  The left ovary measured 1.8 x 3.3 x  1.5 cm.  There was no pelvic fluid noted.  Endometrial biopsy on November 19, 2003, was negative for any hyperplasia or malignancy, although the specimen  was limited.   Lab work done in December 2004:  Hemoglobin 12.5, hematocrit 39.5.  TSH 1.0.  Prolactin 7.3.   ASSESSMENT AND PLAN:  [name] is a 45 year old gravida 1, para 1, with a  symptomatic fibroid uterus.  The patient's had initial evaluation, has been  offered hormonal versus  surgical versus observation as far as management.  The patient was initially undecided and taken Yasmin.  The patient  ultimately opted for Lupron Depot in the interim and definitive management  with a hysterectomy.  Secondary to the size of the patient's uterus, I  discussed that more than likely an abdominal hysterectomy would need to be  performed, and the risks, benefits, and alternatives were discussed with the  patient, including but not limited to bleeding, infection, and injury and  poor wound healing.  Stress-dose steroids will be given at the time of  procedure, and a mammogram will be attempted to be obtained from the breast  center prior to surgery.  Consent signed and witnessed in office and  hospital consent to be signed and witnessed prior to procedure as well as  preop lab work to be done.                                               Osborn Coho, M.D.    AR/MEDQ  D:  04/18/2004  T:  04/18/2004  Job:  [id number]

## 2011-03-13 NOTE — Op Note (Signed)
NAME:  Kathy [name], Kathy [name]                        ACCOUNT NO.:  [id number]   MEDICAL RECORD NO.:  [id number]                   PATIENT TYPE:  INP   LOCATION:  9303                                 FACILITY:  [facility]   PHYSICIAN:  Osborn Coho, M.D.                DATE OF BIRTH:  [birth year]   DATE OF PROCEDURE:  04/18/2004  DATE OF DISCHARGE:                                 OPERATIVE REPORT   PREOPERATIVE DIAGNOSES:  1. Menorrhagia.  2. Symptomatic fibroid uterus.   POSTOPERATIVE DIAGNOSES:  1. Menorrhagia.  2. Symptomatic fibroid uterus.   PROCEDURE:  Total abdominal hysterectomy.   ANESTHESIA:  General.   SURGEON:  Osborn Coho, M.D.   ASSISTANT:  Naima A. Dillard, M.D.   FLUIDS:  3300 mL.   URINE OUTPUT:  200 mL.   ESTIMATED BLOOD LOSS:  250 mL.   COMPLICATIONS:  None.   FINDINGS:  Large fibroid uterus weighing 617 g. Filmy adhesions of bowel to  left adnexa.  Normal appearing bilateral ovaries and fallopian tubes.   DESCRIPTION OF PROCEDURE:  The patient was taken to the operating room after  the risks, benefits, and alternatives were discussed with the patient and  patient verbalized an understanding and consent signed and witnessed. The  patient was placed under general anesthesia and prepped and draped in the  normal sterile fashion. The patient was in the dorsal lithotomy position  with legs flattened.  A Pfannenstiel skin incision was made at the site of  the prior cesarean section scar.  The incision was carried down to the  underlying layer of fascia with the Bovie. The fascia was then excised  bilaterally with the Bovie in the midline and extended bilaterally with the  Mayo scissors.  There was minimal fascial tissue bilaterally distally on the  incision secondary to the incision being very close about 1 cm superior to  the symphysis pubis.  The muscle was separated in the midline with a large  Kelly and taken down to the peritoneum with a hemostat. The  hemostat was  used to grasp the peritoneum and the peritoneum excised with the Metzenbaum  scissors after flashing.  The peritoneum and midline of the superior aspect  of the muscle was separated with the Bovie.  The muscle was taken down in  the midline with the Bovie as well and the peritoneum extended with the  Metzenbaum scissors inferiorly. The uterus was exteriorized. The round  ligament on the right was clamped with a hemostat and a stitch placed using  #0 Vicryl. The round ligament was then cauterized with the Bovie and the  bladder flap started on that side. The uterine ovarian ligament was then  grasped after making a hole through the mesosalpinx. The uteroovarian was  grasped with a large [employer], cut and tied using a #0 Vicryl free tie.  A  #0 Vicryl stitch was then placed for hemostasis. The same  was done on the  left round ligament and left uteroovarian ligament.  Prior to taking down  the left uteroovarian ligament; however, several adhesions of filmy  adhesions were taken down from the bowel to the left adnexa.  The bladder  was then taken down with the sponge on a stick as well as sharply secondary  to adhesions. The uterine vessels were then bilaterally skeletonized and  clamed with a Heaney clamp, cut and suture ligated using #0 Vicryl. This was  done bilaterally. A straight Heaney was then used on the paracervical tissue  down to the level of the cardinal and uterosacral ligaments bilaterally. The  tissue was clamped, cut and suture ligated using #0 Vicryl. A small hematoma  was noted near the uterine vessel on the left and tied twice with the 3-0  Vicryl.  Curved Heaney was placed bilaterally at the lower aspect of the  cervix and the cervix was removed and sent to pathology.  After the uterine  vessels were clamped, cut and suture ligated, a fundectomy was performed.  The underlying surface of the bladder was inspected and made hemostatic with  the Bovie.  The cuff  was closed with #0 Vicryl at the angles using fixation  stitches. In the middle of the cuff, a figure-of-eight using #0 Vicryl was  performed. Copious irrigation using warm normal saline was performed and  Surgicel was placed between the bladder and the vaginal cuff. After the  round ligaments and uteroovarian ligaments were clamped, cut and suture  ligated bilaterally, the Balfour was placed for retraction.  This was done  after packing the bowel away with moist laps.  The intraabdominal cavity was  noted to be hemostatic, the ovarian pedicles bilaterally were noted to be  hemostatic. The round ligaments were noted to be hemostatic.  All  instruments were removed, sponge, lap and needle count was correct. The  peritoneum was closed with #0 chromic in a running fashion.  The muscle was  irrigated and made hemostatic with the Bovie. The fascia was repaired with  #0 Vicryl in a running fashion. Secondary to the attenuated nature of the  fascia bilaterally distally, a second layer was performed using #0 Vicryl in  a running fashion.  The subcutaneous tissue was irrigated and made  hemostatic with the Bovie.  A subcuticular stitch was performed using 3-0  Monocryl.  Steri-Strips were placed.  Sponge, lap and needle counts was  correct. The patient tolerated the procedure well and was returned to the  recovery room in stable condition.                                               Osborn Coho, M.D.    AR/MEDQ  D:  04/18/2004  T:  04/19/2004  Job:  [id number]

## 2011-08-03 LAB — CBC
HCT: 34.5 — ABNORMAL LOW
Hemoglobin: 11.9 — ABNORMAL LOW
MCHC: 34.4
MCV: 89.1
Platelets: 352
RBC: 3.87
RDW: 12.3
WBC: 6.5

## 2012-02-22 ENCOUNTER — Encounter: Payer: Self-pay | Admitting: *Deleted

## 2013-01-20 ENCOUNTER — Other Ambulatory Visit: Payer: Self-pay

## 2013-01-20 DIAGNOSIS — Z1231 Encounter for screening mammogram for malignant neoplasm of breast: Secondary | ICD-10-CM

## 2013-02-24 ENCOUNTER — Ambulatory Visit: Payer: Self-pay

## 2013-04-19 ENCOUNTER — Ambulatory Visit
Admission: RE | Admit: 2013-04-19 | Discharge: 2013-04-19 | Disposition: A | Discharge status: Home / Self Care | Payer: Medicaid Other | Source: Ambulatory Visit

## 2013-04-19 DIAGNOSIS — Z1231 Encounter for screening mammogram for malignant neoplasm of breast: Secondary | ICD-10-CM

## 2014-01-24 ENCOUNTER — Emergency Department (HOSPITAL_COMMUNITY)
Admission: EM | Admit: 2014-01-24 | Discharge: 2014-01-25 | Disposition: A | Discharge status: Home / Self Care | Payer: Medicaid Other | Attending: Emergency Medicine | Admitting: Emergency Medicine

## 2014-01-24 ENCOUNTER — Emergency Department (HOSPITAL_COMMUNITY): Payer: Medicaid Other

## 2014-01-24 ENCOUNTER — Encounter (HOSPITAL_COMMUNITY): Payer: Self-pay | Admitting: Emergency Medicine

## 2014-01-24 DIAGNOSIS — M069 Rheumatoid arthritis, unspecified: Secondary | ICD-10-CM | POA: Insufficient documentation

## 2014-01-24 DIAGNOSIS — R079 Chest pain, unspecified: Secondary | ICD-10-CM

## 2014-01-24 DIAGNOSIS — Z791 Long term (current) use of non-steroidal anti-inflammatories (NSAID): Secondary | ICD-10-CM | POA: Insufficient documentation

## 2014-01-24 DIAGNOSIS — R609 Edema, unspecified: Secondary | ICD-10-CM | POA: Insufficient documentation

## 2014-01-24 DIAGNOSIS — R000 Tachycardia, unspecified: Secondary | ICD-10-CM | POA: Insufficient documentation

## 2014-01-24 DIAGNOSIS — Z79899 Other long term (current) drug therapy: Secondary | ICD-10-CM | POA: Insufficient documentation

## 2014-01-24 DIAGNOSIS — R0789 Other chest pain: Secondary | ICD-10-CM | POA: Insufficient documentation

## 2014-01-24 HISTORY — DX: Rheumatoid vasculitis with rheumatoid arthritis of unspecified site: M05.20

## 2014-01-24 LAB — CBC
HCT: 31.9 % — ABNORMAL LOW (ref 36.0–46.0)
Hemoglobin: 10.6 g/dL — ABNORMAL LOW (ref 12.0–15.0)
MCH: 30.8 pg (ref 26.0–34.0)
MCHC: 33.2 g/dL (ref 30.0–36.0)
MCV: 92.7 fL (ref 78.0–100.0)
Platelets: 231 10*3/uL (ref 150–400)
RBC: 3.44 MIL/uL — AB (ref 3.87–5.11)
RDW: 14.1 % (ref 11.5–15.5)
WBC: 4.9 10*3/uL (ref 4.0–10.5)

## 2014-01-24 LAB — BASIC METABOLIC PANEL
BUN: 12 mg/dL (ref 6–23)
CHLORIDE: 106 meq/L (ref 96–112)
CO2: 24 meq/L (ref 19–32)
CREATININE: 0.65 mg/dL (ref 0.50–1.10)
Calcium: 9.1 mg/dL (ref 8.4–10.5)
GFR calc non Af Amer: >90 mL/min (ref >90)
Glucose, Bld: 106 mg/dL — ABNORMAL HIGH (ref 70–99)
POTASSIUM: 3.5 meq/L — AB (ref 3.7–5.3)
Sodium: 144 mEq/L (ref 137–147)

## 2014-01-24 LAB — I-STAT TROPONIN, ED: Troponin i, poc: 0 ng/mL (ref 0.00–0.08)

## 2014-01-24 MED ORDER — KETOROLAC TROMETHAMINE 30 MG/ML IJ SOLN
30.0000 mg | Freq: Once | INTRAMUSCULAR | Status: AC
  Administered 2014-01-25: 30 mg via INTRAVENOUS
  Filled 2014-01-24: qty 1

## 2014-01-24 NOTE — ED Notes (Signed)
Per ems, pt from home. Pt got out of shower, started having tightness in chest, center of chest does not radiate with a little shortness of breath. Denies N/V/D, no diaphoresis. Intermittent chest pain. Pain gets worse when she takes a deep breath. Pt AAOX4. Pt states she cannot take aspirin. Pt given 2 nitro with no change in CP.

## 2014-01-24 NOTE — ED Provider Notes (Signed)
CSN: [id number]     Arrival date & time 01/24/14  2229 History   First MD Initiated Contact with Patient 01/24/14 2256     Chief Complaint  Patient presents with  . Chest Pain     (Consider location/radiation/quality/duration/timing/severity/associated sxs/prior Treatment) HPI Comments: 48 year old female with a history of rheumatoid arthritis who presents with acute onset of pain in the middle of her chest that started approximately 4 hours prior to arrival. She states that she was sitting down and eating at the time, this was just after getting out of the shower. She was not exerting herself, this is not positional but it does get worse with deep breathing. She does have swelling of her lower extremities though this is a chronic problem given her rheumatoid arthritis she notes that it has gotten slightly worse recently, right greater than left. She denies fevers or chills nausea or vomiting shortness of breath, cough, fever and the pain does not radiate. She has no problems with urination or diarrhea, has never had a blood clot and has no risk factors for acute coronary syndrome. There is no family history of heart disease, she does not smoke cigarettes or use of drugs and she has no history of hypertension diabetes or hypercholesterolemia. She has never had a cardiac workup, has never had a pulmonary embolism workup and does not take any oral contraceptives or replacement therapy, has no injuries or trauma recently and no recent surgery. She does take chronic ibuprofen as well as methotrexate and other medications for her chronic rheumatism  Patient is a 48 y.o. female presenting with chest pain. The history is provided by the patient.  Chest Pain   Past Medical History  Diagnosis Date  . Rheumatoid arteritis    History reviewed. No pertinent past surgical history. No family history on file. History  Substance Use Topics  . Smoking status: Never Smoker   . Smokeless tobacco: Not on file   . Alcohol Use: No   OB History   Grav Para Term Preterm Abortions TAB SAB Ect Mult Living                 Review of Systems  Cardiovascular: Positive for chest pain.  All other systems reviewed and are negative.      Allergies  Aspirin  Home Medications   Current Outpatient Rx  Name  Route  Sig  Dispense  Refill  . albuterol (PROVENTIL HFA;VENTOLIN HFA) 108 (90 BASE) MCG/ACT inhaler   Inhalation   Inhale 1-2 puffs into the lungs every 6 (six) hours as needed for wheezing or shortness of breath.         . diclofenac sodium (VOLTAREN) 1 % GEL   Topical   Apply 4 g topically 4 (four) times daily as needed (for joint pain).         . folic acid (FOLVITE) 1 MG tablet   Oral   Take 1 mg by mouth daily.         Marland Kitchen ibuprofen (ADVIL,MOTRIN) 800 MG tablet   Oral   Take 800 mg by mouth every 8 (eight) hours as needed for mild pain.         Marland Kitchen leflunomide (ARAVA) 10 MG tablet   Oral   Take 10 mg by mouth daily.         . methotrexate (RHEUMATREX) 2.5 MG tablet   Oral   Take 25 mg by mouth once a week. Caution:Chemotherapy. Protect from light. Takes on Friday         .  Multiple Vitamins-Minerals (MULTIVITAMIN PO)   Oral   Take 1 tablet by mouth daily.         Marland Kitchen omeprazole (PRILOSEC) 20 MG capsule   Oral   Take 20 mg by mouth daily.         Marland Kitchen sulfaSALAzine (AZULFIDINE) 500 MG EC tablet   Oral   Take 1,000 mg by mouth 2 (two) times daily.          BP 110/73  Pulse 96  Temp(Src) 98.1 F (36.7 C) (Oral)  Resp 20  SpO2 99% Physical Exam  Nursing note and vitals reviewed. Constitutional: She appears well-developed and well-nourished. No distress.  HENT:  Head: Normocephalic and atraumatic.  Mouth/Throat: Oropharynx is clear and moist. No oropharyngeal exudate.  Eyes: Conjunctivae and EOM are normal. Pupils are equal, round, and reactive to light. Right eye exhibits no discharge. Left eye exhibits no discharge. No scleral icterus.  Neck: Normal  range of motion. Neck supple. No JVD present. No thyromegaly present.  Cardiovascular: Regular rhythm, normal heart sounds and intact distal pulses.  Exam reveals no gallop and no friction rub.   No murmur heard. Tachycardic to 110  Pulmonary/Chest: Effort normal and breath sounds normal. No respiratory distress. She has no wheezes. She has no rales.  Pain worsens with deep breathing  Abdominal: Soft. Bowel sounds are normal. She exhibits no distension and no mass. There is no tenderness.  Musculoskeletal: Normal range of motion. She exhibits edema ( R>L LLE edema (mild)) and tenderness ( ttp with manipulation of joints of the knees, ankles, wrists).  Lymphadenopathy:    She has no cervical adenopathy.  Neurological: She is alert. Coordination normal.  Skin: Skin is warm and dry. No rash noted. No erythema.  Psychiatric: She has a normal mood and affect. Her behavior is normal.    ED Course  Procedures (including critical care time) Labs Review Labs Reviewed  CBC - Abnormal; Notable for the following:    RBC 3.44 (*)    Hemoglobin 10.6 (*)    HCT 31.9 (*)    All other components within normal limits  BASIC METABOLIC PANEL - Abnormal; Notable for the following:    Potassium 3.5 (*)    Glucose, Bld 106 (*)    All other components within normal limits  I-STAT TROPOININ, ED   Imaging Review Dg Chest 2 View  01/24/2014   CLINICAL DATA:  CHEST PAIN  EXAM: CHEST  2 VIEW  COMPARISON:  DG CERVICAL SPINE WITH FLEX & EXTEND dated 01/11/2014; DG CERVICAL SPINE WITH FLEX & EXTEND dated 08/06/2011; DG CHEST 2V dated 09/09/2009  FINDINGS: The heart size and mediastinal contours are within normal limits. Both lungs are clear. The visualized skeletal structures are unremarkable.  IMPRESSION: No active cardiopulmonary disease.   Electronically Signed   By: Salome Holmes M.D.   On: 01/24/2014 23:19   Ct Angio Chest Pe W/cm &/or Wo Cm  01/25/2014   CLINICAL DATA:  Chest tightness, with mild shortness of  breath. Pain worsens with deep breathing.  EXAM: CT ANGIOGRAPHY CHEST WITH CONTRAST  TECHNIQUE: Multidetector CT imaging of the chest was performed using the standard protocol during bolus administration of intravenous contrast. Multiplanar CT image reconstructions and MIPs were obtained to evaluate the vascular anatomy.  CONTRAST:  OMNIPAQUE IOHEXOL 350 MG/ML SOLN  COMPARISON:  Chest radiograph performed 01/24/2014  FINDINGS: There is no evidence of pulmonary embolus.  The lungs are clear bilaterally. There is no evidence of significant focal consolidation,  pleural effusion or pneumothorax. No masses are identified; no abnormal focal contrast enhancement is seen.  The mediastinum is unremarkable in appearance. No mediastinal lymphadenopathy is seen. No pericardial effusion is identified. The great vessels are grossly unremarkable in appearance. No axillary lymphadenopathy is seen. The visualized portions of the thyroid gland are unremarkable in appearance.  The visualized portions of the liver and spleen are unremarkable.  No acute osseous abnormalities are seen.  Review of the MIP images confirms the above findings.  IMPRESSION: No evidence of pulmonary embolus.  Lungs clear bilaterally.   Electronically Signed   By: Roanna Raider M.D.   On: 01/25/2014 01:31     EKG Interpretation   Date/Time:  Wednesday January 24 2014 22:30:52 EDT Ventricular Rate:  109 PR Interval:  170 QRS Duration: 80 QT Interval:  355 QTC Calculation: 478 R Axis:   65 Text Interpretation:  Sinus tachycardia No previous ECGs available  Confirmed by YAO  MD, DAVID ([id number]) on 01/24/2014 10:33:05 PM      MDM   Final diagnoses:  Chest pain    The patient does have some increased risk for pulmonary embolism, she is tachycardic and has pain that increases with breathing, is not hypoxic but CT angiogram of the chest would be appropriate given her risk. She is very low risk for acute coronary syndrome and has no exertional  symptoms, no ischemic findings on EKG and normal troponin. Nitroglycerin given prior to arrival by paramedics with no improvement of the pain. She declined aspirin citing aspirin allergy. She does take daily ibuprofen and does not have an allergy to that.  CT scan negative for pulmonary embolism, no change on repeat EKG, labs unremarkable, patient has minimal symptoms, stable for discharge, very low risk for acute coronary disease.  Pt encouraged to have 24 hour f/u with her PMD  ED ECG REPORT  I personally interpreted this EKG   Date: 01/25/2014   Rate: 97  Rhythm: normal sinus rhythm  QRS Axis: normal  Intervals: normal  ST/T Wave abnormalities: normal  Conduction Disutrbances:none  Narrative Interpretation:   Old EKG Reviewed: unchanged  Meds given in ED:  Medications  ketorolac (TORADOL) 30 MG/ML injection 30 mg (30 mg Intravenous Given 01/25/14 0012)  iohexol (OMNIPAQUE) 350 MG/ML injection 100 mL (100 mLs Intravenous Contrast Given 01/25/14 0108)    Discharge Medication List as of 01/25/2014  1:38 AM        Vida Roller, MD 01/25/14 [phone]

## 2014-01-25 ENCOUNTER — Encounter (HOSPITAL_COMMUNITY): Payer: Self-pay | Admitting: Radiology

## 2014-01-25 ENCOUNTER — Emergency Department (HOSPITAL_COMMUNITY): Payer: Medicaid Other

## 2014-01-25 MED ORDER — IOHEXOL 350 MG/ML SOLN
100.0000 mL | Freq: Once | INTRAVENOUS | Status: AC | PRN
  Administered 2014-01-25: 100 mL via INTRAVENOUS

## 2014-01-25 NOTE — Discharge Instructions (Signed)
Your caregiver has diagnosed you as having chest pain that is nonspecific for one problem. This means that after looking at you and examining you and ordering tests (such as blood work, chest x-rays and EKG), your caregiver does not believe that the problem is serious enough to need watching in the hospital. This judgment is often made after testing shows no acute heart attack and you are at low risk for sudden acute heart condition. Chest pain comes from many different causes.  Seek immediate medical attention if:   You have severe chest pain, especially if the pain is crushing or pressure-like and spreads to the arms, back, neck, or jaw, or if you have sweating, nausea, shortness of breath. This is an emergency. Don't wait to see if the pain will go away. Get medical help at once. Call 911 immediately. Do not drive herself to the hospital.   Your chest pain gets worse and does not go away with rest.   You have an attack of chest pain lasting longer than usual, despite rest and treatment with the medications your caregiver has prescribed   You awaken from sleep with chest pain or shortness of breath.   You feel faint or dizzy   You have chest pain not typical of your usual pain for which you originally saw your caregiver.  You must have a repeat evaluation within 24 hours for a recheck of your heart.  Please call your doctor this morning to schedule this appointment. If you do not have a family doctor, please see the list of doctors below.  RESOURCE GUIDE  Dental Problems  Patients with Medicaid: [facility]                     [facility] [address]                                           [address] Phone:  [phone]                                                  Phone:  [phone]  If unable to pay or uninsured, contact:  [facility] or [facility] to become qualified for the adult dental clinic.  Chronic Pain  Problems Contact [facility]  [phone] Patients need to be referred by their primary care doctor.  Insufficient Money for Medicine Contact [employer]:  call "211" or [facility] 271-5999.  No Primary Care Doctor Call [facility]  832-8000 Other agencies that provide inexpensive medical care    [facility]  [phone]    [facility]  [phone]    [facility]  [phone]    [facility] Clinic  [phone]    [facility]  [phone]    [facility]  [phone]  Psychological Services [facility]  [phone] [facility]  [phone] [facility]   800 853-5163 (emergency services 641-4993)  Substance Abuse Resources [facility]  336-882-2125 [facility] 336-784-9470 [facility] 336-285-9073 [facility] 336-845-3988 Residential & Outpatient Substance Abuse Program  800-659-3381  Abuse/Neglect [place] Child Abuse Hotline (336) 641-3795 [place] [facility] Abuse Hotline 800-378-5315 (After Hours)  Emergency Shelter [facility] (336) 271-5985  [facility] (336) 275-9566 [facility] (704) 372-4663  MRSA Hotline #:   832-7006    [place] [facility]  [facility] [place]     [employer]                          [facility] [address] [place]                       [address]      [address]  [place]                                                [place]                            [place] Phone:  [phone]                                   Phone:  [phone]                 Phone:  [phone]  [facility] Phone:  [phone]  [place] Child Abuse Hotline (336) 342-1394 (336) 342-3537 (After Hours)    

## 2014-01-25 NOTE — ED Notes (Signed)
Pt in CT at this time.

## 2014-03-25 ENCOUNTER — Encounter (HOSPITAL_COMMUNITY): Payer: Self-pay | Admitting: Emergency Medicine

## 2014-03-25 ENCOUNTER — Emergency Department (HOSPITAL_COMMUNITY)
Admission: EM | Admit: 2014-03-25 | Discharge: 2014-03-25 | Disposition: A | Discharge status: Home / Self Care | Payer: Medicaid Other | Attending: Emergency Medicine | Admitting: Emergency Medicine

## 2014-03-25 ENCOUNTER — Emergency Department (HOSPITAL_COMMUNITY): Payer: Medicaid Other

## 2014-03-25 DIAGNOSIS — IMO0002 Reserved for concepts with insufficient information to code with codable children: Secondary | ICD-10-CM | POA: Insufficient documentation

## 2014-03-25 DIAGNOSIS — Z79899 Other long term (current) drug therapy: Secondary | ICD-10-CM | POA: Insufficient documentation

## 2014-03-25 DIAGNOSIS — R220 Localized swelling, mass and lump, head: Secondary | ICD-10-CM | POA: Insufficient documentation

## 2014-03-25 DIAGNOSIS — M069 Rheumatoid arthritis, unspecified: Secondary | ICD-10-CM | POA: Insufficient documentation

## 2014-03-25 DIAGNOSIS — J029 Acute pharyngitis, unspecified: Secondary | ICD-10-CM | POA: Insufficient documentation

## 2014-03-25 DIAGNOSIS — R221 Localized swelling, mass and lump, neck: Principal | ICD-10-CM

## 2014-03-25 DIAGNOSIS — R6889 Other general symptoms and signs: Secondary | ICD-10-CM

## 2014-03-25 MED ORDER — GI COCKTAIL ~~LOC~~
30.0000 mL | Freq: Once | ORAL | Status: AC
  Administered 2014-03-25: 30 mL via ORAL
  Filled 2014-03-25: qty 30

## 2014-03-25 MED ORDER — IBUPROFEN 100 MG/5ML PO SUSP
600.0000 mg | Freq: Once | ORAL | Status: DC
  Filled 2014-03-25: qty 30

## 2014-03-25 MED ORDER — RANITIDINE HCL 150 MG PO CAPS: 150.0000 mg | ORAL_CAPSULE | Freq: Two times a day (BID) | ORAL | Status: DC

## 2014-03-25 NOTE — Discharge Instructions (Signed)
Try zantac to help with your symptoms  Read diet instructions below - your symptoms may be related to reflux  Return to the emergency department if you develop any changing/worsening condition, coughing or vomiting up blood, difficulty breathing or swallowing, fever or any other concerns (please read additional information regarding your condition below)    Gastroesophageal Reflux Disease, Adult Gastroesophageal reflux disease (GERD) happens when acid from your stomach flows up into the esophagus. When acid comes in contact with the esophagus, the acid causes soreness (inflammation) in the esophagus. Over time, GERD may create small holes (ulcers) in the lining of the esophagus. CAUSES   Increased body weight. This puts pressure on the stomach, making acid rise from the stomach into the esophagus.  Smoking. This increases acid production in the stomach.  Drinking alcohol. This causes decreased pressure in the lower esophageal sphincter (valve or ring of muscle between the esophagus and stomach), allowing acid from the stomach into the esophagus.  Late evening meals and a full stomach. This increases pressure and acid production in the stomach.  A malformed lower esophageal sphincter. Sometimes, no cause is found. SYMPTOMS   Burning pain in the lower part of the mid-chest behind the breastbone and in the mid-stomach area. This may occur twice a week or more often.  Trouble swallowing.  Sore throat.  Dry cough.  Asthma-like symptoms including chest tightness, shortness of breath, or wheezing. DIAGNOSIS  Your caregiver may be able to diagnose GERD based on your symptoms. In some cases, X-rays and other tests may be done to check for complications or to check the condition of your stomach and esophagus. TREATMENT  Your caregiver may recommend over-the-counter or prescription medicines to help decrease acid production. Ask your caregiver before starting or adding any new medicines.    HOME CARE INSTRUCTIONS   Change the factors that you can control. Ask your caregiver for guidance concerning weight loss, quitting smoking, and alcohol consumption.  Avoid foods and drinks that make your symptoms worse, such as:  Caffeine or alcoholic drinks.  Chocolate.  Peppermint or mint flavorings.  Garlic and onions.  Spicy foods.  Citrus fruits, such as oranges, lemons, or limes.  Tomato-based foods such as sauce, chili, salsa, and pizza.  Fried and fatty foods.  Avoid lying down for the 3 hours prior to your bedtime or prior to taking a nap.  Eat small, frequent meals instead of large meals.  Wear loose-fitting clothing. Do not wear anything tight around your waist that causes pressure on your stomach.  Raise the head of your bed 6 to 8 inches with wood blocks to help you sleep. Extra pillows will not help.  Only take over-the-counter or prescription medicines for pain, discomfort, or fever as directed by your caregiver.  Do not take aspirin, ibuprofen, or other nonsteroidal anti-inflammatory drugs (NSAIDs). SEEK IMMEDIATE MEDICAL CARE IF:   You have pain in your arms, neck, jaw, teeth, or back.  Your pain increases or changes in intensity or duration.  You develop nausea, vomiting, or sweating (diaphoresis).  You develop shortness of breath, or you faint.  Your vomit is green, yellow, black, or looks like coffee grounds or blood.  Your stool is red, bloody, or black. These symptoms could be signs of other problems, such as heart disease, gastric bleeding, or esophageal bleeding. MAKE SURE YOU:   Understand these instructions.  Will watch your condition.  Will get help right away if you are not doing well or get worse. Document Released:  07/22/2005 Document Revised: 01/04/2012 Document Reviewed: 05/01/2011 [facility] Patient Information 2014 [place], [place].  Diet for Gastroesophageal Reflux Disease, Adult Reflux (acid reflux) is when acid from your  stomach flows up into the esophagus. When acid comes in contact with the esophagus, the acid causes irritation and soreness (inflammation) in the esophagus. When reflux happens often or so severely that it causes damage to the esophagus, it is called gastroesophageal reflux disease (GERD). Nutrition therapy can help ease the discomfort of GERD. FOODS OR DRINKS TO AVOID OR LIMIT  Smoking or chewing tobacco. Nicotine is one of the most potent stimulants to acid production in the gastrointestinal tract.  Caffeinated and decaffeinated coffee and black tea.  Regular or low-calorie carbonated beverages or energy drinks (caffeine-free carbonated beverages are allowed).   Strong spices, such as black pepper, white pepper, red pepper, cayenne, curry powder, and chili powder.  Peppermint or spearmint.  Chocolate.  High-fat foods, including meats and fried foods. Extra added fats including oils, butter, salad dressings, and nuts. Limit these to less than 8 tsp per day.  Fruits and vegetables if they are not tolerated, such as citrus fruits or tomatoes.  Alcohol.  Any food that seems to aggravate your condition. If you have questions regarding your diet, call your caregiver or a registered dietitian. OTHER THINGS THAT MAY HELP GERD INCLUDE:   Eating your meals slowly, in a relaxed setting.  Eating 5 to 6 small meals per day instead of 3 large meals.  Eliminating food for a period of time if it causes distress.  Not lying down until 3 hours after eating a meal.  Keeping the head of your bed raised 6 to 9 inches (15 to 23 cm) by using a foam wedge or blocks under the legs of the bed. Lying flat may make symptoms worse.  Being physically active. Weight loss may be helpful in reducing reflux in overweight or obese adults.  Wear loose fitting clothing EXAMPLE MEAL PLAN This meal plan is approximately 2,000 calories based on https://www.[facility].org/ meal planning guidelines. Breakfast   cup cooked  oatmeal.  1 cup strawberries.  1 cup low-fat milk.  1 oz almonds. Snack  1 cup cucumber slices.  6 oz yogurt (made from low-fat or fat-free milk). Lunch  2 slice whole-wheat bread.  2 oz sliced [place].  2 tsp mayonnaise.  1 cup blueberries.  1 cup snap peas. Snack  6 whole-wheat crackers.  1 oz string cheese. Dinner   cup brown rice.  1 cup mixed veggies.  1 tsp olive oil.  3 oz grilled fish. Document Released: 10/12/2005 Document Revised: 01/04/2012 Document Reviewed: 08/28/2011 [facility] Patient Information 2014 [place], [place].

## 2014-03-25 NOTE — ED Notes (Signed)
Pt sipping on water 

## 2014-03-25 NOTE — ED Provider Notes (Signed)
CSN: [id number]     Arrival date & time 03/25/14  0620 History   First MD Initiated Contact with Patient 03/25/14 0710     Chief Complaint  Patient presents with  . Oral Swelling   HPI  Kathy [name] is a 48 y.o. female with a PMH of RA who presents to the ED for evaluation of a throat problem. History was provided by the patient. Patient states she has had gradually worsening pressure and lump in her middle throat for the past week. States her symptoms are worse at night. Describes a pulling and pressure sensation and a build-up of saliva in her throat. Patient denies any difficulty swallowing or breathing. No foreign body sensation or pain with swallowing. She states she has tried coughing up the saliva in her throat tonight and noticed a tinge of blood. No hemoptysis or hematemesis. Patient called EMS after she noticed this who arrived and did not transport patient to ED. Patient states her symptoms has persisted throughout the day and she "wants to know what it is." She occasionally has a bitter sour taste in her mouth. No fever, chills, rhinorrhea, congestion, oral swelling, chest pain, SOB, nausea, vomiting, abdominal pain, headache. Takes Ibuprofen for her arthritis which she took last night with no improvement in her symptoms.    Past Medical History  Diagnosis Date  . Rheumatoid arteritis    History reviewed. No pertinent past surgical history. No family history on file. History  Substance Use Topics  . Smoking status: Never Smoker   . Smokeless tobacco: Not on file  . Alcohol Use: No   OB History   Grav Para Term Preterm Abortions TAB SAB Ect Mult Living                 Review of Systems  Constitutional: Negative for fever, chills, activity change, appetite change and fatigue.  HENT: Positive for sore throat. Negative for congestion, drooling, ear pain, mouth sores, postnasal drip, rhinorrhea, trouble swallowing and voice change.   Respiratory: Negative for cough and  shortness of breath.   Cardiovascular: Negative for chest pain.  Gastrointestinal: Negative for nausea, vomiting and abdominal pain.  Musculoskeletal: Negative for neck pain and neck stiffness.  Skin: Negative for rash.  Neurological: Negative for dizziness, weakness, light-headedness, numbness and headaches.     Allergies  Aspirin and Methotrexate derivatives  Home Medications   Prior to Admission medications   Medication Sig Start Date End Date Taking? Authorizing Provider  albuterol (PROVENTIL HFA;VENTOLIN HFA) 108 (90 BASE) MCG/ACT inhaler Inhale 1-2 puffs into the lungs every 6 (six) hours as needed for wheezing or shortness of breath.   Yes Historical Provider, MD  diclofenac sodium (VOLTAREN) 1 % GEL Apply 4 g topically 4 (four) times daily as needed (for joint pain).   Yes Historical Provider, MD  folic acid (FOLVITE) 1 MG tablet Take 1 mg by mouth daily.   Yes Historical Provider, MD  ibuprofen (ADVIL,MOTRIN) 800 MG tablet Take 800 mg by mouth every 8 (eight) hours as needed for mild pain.   Yes Historical Provider, MD  leflunomide (ARAVA) 10 MG tablet Take 10 mg by mouth daily.   Yes Historical Provider, MD  Multiple Vitamins-Minerals (MULTIVITAMIN PO) Take 1 tablet by mouth daily.   Yes Historical Provider, MD  omeprazole (PRILOSEC) 20 MG capsule Take 20 mg by mouth daily.   Yes Historical Provider, MD  predniSONE (DELTASONE) 5 MG tablet Take 5 mg by mouth daily as needed (when pt is  alot of pain).   Yes Historical Provider, MD  sulfaSALAzine (AZULFIDINE) 500 MG EC tablet Take 1,000 mg by mouth 2 (two) times daily.   Yes Historical Provider, MD   BP 136/89  Pulse 102  Temp(Src) 98.1 F (36.7 C) (Oral)  Resp 18  SpO2 97%  Filed Vitals:   03/25/14 0639 03/25/14 0749  BP: 141/100 136/89  Pulse: 93 102  Temp: 98.1 F (36.7 C)   TempSrc: Oral   Resp: 18 18  SpO2: 95% 97%    Physical Exam  Nursing note and vitals reviewed. Constitutional: She is oriented to person,  place, and time. She appears well-developed and well-nourished. No distress.  HENT:  Head: Normocephalic and atraumatic.  Right Ear: External ear normal.  Left Ear: External ear normal.  Nose: Nose normal.  Mouth/Throat: Oropharynx is clear and moist. No oropharyngeal exudate.  No erythema to the posterior pharynx. Tonsils without edema or exudates. Uvula midline. No trismus. No difficulty controlling secretions. No muffled voice. No tongue or oral edema.  Eyes: Conjunctivae and EOM are normal. Pupils are equal, round, and reactive to light. Right eye exhibits no discharge. Left eye exhibits no discharge.  Neck: Normal range of motion. Neck supple. No tracheal deviation present.  No cervical lymphadenopathy. No nuchal rigidity. No neck edema or masses. Trachea midline.   Cardiovascular: Normal rate, regular rhythm and normal heart sounds.  Exam reveals no gallop and no friction rub.   No murmur heard. Pulmonary/Chest: Effort normal and breath sounds normal. No stridor. No respiratory distress. She has no wheezes. She has no rales. She exhibits no tenderness.  Abdominal: Soft. She exhibits no distension. There is no tenderness.  Musculoskeletal: Normal range of motion. She exhibits no edema and no tenderness.  Lymphadenopathy:    She has no cervical adenopathy.  Neurological: She is alert and oriented to person, place, and time.  Skin: Skin is warm and dry. She is not diaphoretic.     ED Course  Procedures (including critical care time) Labs Review Labs Reviewed - No data to display  Imaging Review Dg Neck Soft Tissue  03/25/2014   CLINICAL DATA:  Swelling and difficulty swallowing  EXAM: NECK SOFT TISSUES - 1+ VIEW  COMPARISON:  01/11/2014  FINDINGS: There is no evidence of retropharyngeal soft tissue swelling or epiglottic enlargement. The cervical airway is unremarkable and no radio-opaque foreign body identified. Degenerative changes of the cervical spine are noted.  IMPRESSION: No  acute abnormality noted.   Electronically Signed   By: Alcide Clever M.D.   On: 03/25/2014 08:18     EKG Interpretation None      MDM   Kathy [name] is a 48 y.o. female with a PMH of RA who presents to the ED for evaluation of a throat problem. Patient complains of increased oral secretions and a throat pulling sensation. Symptoms possibly due to reflux. No evidence of foreign body, soft tissue swelling, or airway compromise on cervical x-rays or physical exam. Patient able to tolerate fluids without difficulty. Had improvements in her condition with GI cocktail. No evidence of peritonsillar abscess or other infectious process. Patient afebrile and non-toxic in appearance. Will try starting patient on zantac. GERD and soft diet discussed. Follow-up with PCP this week. Return precautions, discharge instructions, and follow-up was discussed with the patient before discharge.    Rechecks  8:30 AM = Patient states she feels better after GI cocktail. Able to tolerate fluids without difficulty.      Discharge Medication  List as of 03/25/2014  8:35 AM    START taking these medications   Details  ranitidine (ZANTAC) 150 MG capsule Take 1 capsule (150 mg total) by mouth 2 (two) times daily., Starting 03/25/2014, Until Discontinued, Print         Final impressions: 1. Throat symptom       Luiz Iron PA-C   This patient was discussed with Dr. Mingo Amber, PA-C 03/25/14 [phone]

## 2014-03-25 NOTE — ED Notes (Signed)
Bed: [facility] Expected date: 03/25/14 Expected time: 6:09 AM Means of arrival: Ambulance Comments: Lump in throat

## 2014-03-25 NOTE — ED Notes (Signed)
Per EMS report: pt has been having a "pulling/lump sensation" in her throat. EMS visited pt at 01:00 and saw bright red blood on napkin but did not go the hospital. On EMS's second visit, Pt denies coughing up any blood tinged mucus.  Pt a/o x 4. Pt ambulatory.  EMS reports lung sounds clear and has a hx of asthma. EMS VS: BP: 146/92, HR:76, RR: 16

## 2014-03-28 NOTE — ED Provider Notes (Signed)
Medical screening examination/treatment/procedure(s) were performed by non-physician practitioner and as supervising physician I was immediately available for consultation/collaboration.   EKG Interpretation None        Gwyneth Sprout, MD 03/28/14 1331

## 2014-05-18 ENCOUNTER — Other Ambulatory Visit: Payer: Self-pay

## 2014-05-18 DIAGNOSIS — Z1231 Encounter for screening mammogram for malignant neoplasm of breast: Secondary | ICD-10-CM

## 2014-06-01 ENCOUNTER — Ambulatory Visit: Payer: Medicaid Other

## 2014-07-11 ENCOUNTER — Emergency Department (HOSPITAL_COMMUNITY)
Admission: EM | Admit: 2014-07-11 | Discharge: 2014-07-11 | Disposition: A | Discharge status: Home / Self Care | Payer: Medicaid Other | Attending: Emergency Medicine | Admitting: Emergency Medicine

## 2014-07-11 ENCOUNTER — Emergency Department (HOSPITAL_COMMUNITY): Payer: Medicaid Other

## 2014-07-11 ENCOUNTER — Encounter (HOSPITAL_COMMUNITY): Payer: Self-pay | Admitting: Emergency Medicine

## 2014-07-11 DIAGNOSIS — Z8679 Personal history of other diseases of the circulatory system: Secondary | ICD-10-CM | POA: Insufficient documentation

## 2014-07-11 DIAGNOSIS — Z79899 Other long term (current) drug therapy: Secondary | ICD-10-CM | POA: Insufficient documentation

## 2014-07-11 DIAGNOSIS — R209 Unspecified disturbances of skin sensation: Secondary | ICD-10-CM | POA: Diagnosis present

## 2014-07-11 DIAGNOSIS — IMO0002 Reserved for concepts with insufficient information to code with codable children: Secondary | ICD-10-CM | POA: Diagnosis not present

## 2014-07-11 DIAGNOSIS — M069 Rheumatoid arthritis, unspecified: Secondary | ICD-10-CM | POA: Insufficient documentation

## 2014-07-11 DIAGNOSIS — M5412 Radiculopathy, cervical region: Secondary | ICD-10-CM

## 2014-07-11 LAB — BASIC METABOLIC PANEL
Anion gap: 15 (ref 5–15)
BUN: 12 mg/dL (ref 6–23)
CO2: 24 meq/L (ref 19–32)
Calcium: 9.8 mg/dL (ref 8.4–10.5)
Chloride: 101 mEq/L (ref 96–112)
Creatinine, Ser: 0.61 mg/dL (ref 0.50–1.10)
GFR calc Af Amer: >90 mL/min (ref >90)
GFR calc non Af Amer: >90 mL/min (ref >90)
GLUCOSE: 102 mg/dL — AB (ref 70–99)
POTASSIUM: 3.9 meq/L (ref 3.7–5.3)
SODIUM: 140 meq/L (ref 137–147)

## 2014-07-11 LAB — CBC WITH DIFFERENTIAL/PLATELET
Basophils Absolute: 0 10*3/uL (ref 0.0–0.1)
Basophils Relative: 0 % (ref 0–1)
Eosinophils Absolute: 0 10*3/uL (ref 0.0–0.7)
Eosinophils Relative: 0 % (ref 0–5)
HCT: 33.1 % — ABNORMAL LOW (ref 36.0–46.0)
HEMOGLOBIN: 10.6 g/dL — AB (ref 12.0–15.0)
LYMPHS ABS: 2.1 10*3/uL (ref 0.7–4.0)
LYMPHS PCT: 29 % (ref 12–46)
MCH: 28.9 pg (ref 26.0–34.0)
MCHC: 32 g/dL (ref 30.0–36.0)
MCV: 90.2 fL (ref 78.0–100.0)
MONOS PCT: 7 % (ref 3–12)
Monocytes Absolute: 0.5 10*3/uL (ref 0.1–1.0)
NEUTROS PCT: 64 % (ref 43–77)
Neutro Abs: 4.5 10*3/uL (ref 1.7–7.7)
PLATELETS: 360 10*3/uL (ref 150–400)
RBC: 3.67 MIL/uL — AB (ref 3.87–5.11)
RDW: 13.5 % (ref 11.5–15.5)
WBC: 7.1 10*3/uL (ref 4.0–10.5)

## 2014-07-11 LAB — TROPONIN I

## 2014-07-11 MED ORDER — ONDANSETRON HCL 4 MG/2ML IJ SOLN
4.0000 mg | Freq: Once | INTRAMUSCULAR | Status: AC
  Administered 2014-07-11: 4 mg via INTRAVENOUS
  Filled 2014-07-11: qty 2

## 2014-07-11 MED ORDER — ASPIRIN 81 MG PO CHEW
324.0000 mg | CHEWABLE_TABLET | Freq: Once | ORAL | Status: AC
  Administered 2014-07-11: 324 mg via ORAL
  Filled 2014-07-11: qty 4

## 2014-07-11 MED ORDER — LORAZEPAM 2 MG/ML IJ SOLN
1.0000 mg | Freq: Once | INTRAMUSCULAR | Status: AC
  Administered 2014-07-11: 1 mg via INTRAVENOUS
  Filled 2014-07-11: qty 1

## 2014-07-11 MED ORDER — HYDROCODONE-ACETAMINOPHEN 5-325 MG PO TABS: 2.0000 | ORAL_TABLET | ORAL | Status: DC | PRN

## 2014-07-11 MED ORDER — HYDROCODONE-ACETAMINOPHEN 5-325 MG PO TABS
1.0000 | ORAL_TABLET | Freq: Once | ORAL | Status: AC
  Administered 2014-07-11: 1 via ORAL
  Filled 2014-07-11: qty 1

## 2014-07-11 MED ORDER — PREDNISONE 20 MG PO TABS: ORAL_TABLET | ORAL | Status: DC

## 2014-07-11 NOTE — ED Notes (Signed)
Patient transported to MRI 

## 2014-07-11 NOTE — ED Notes (Signed)
Went into pts room, introduced myself to pt and asked pt to change into hospital gown so i can preform ekg. Pt refuses for me to take care of her. Pt kept saying my badge isn't "legit" over and over and to get her another nurse in the room.

## 2014-07-11 NOTE — ED Notes (Signed)
Pt escorted to discharge window. Pt verbalized understanding discharge instructions. In no acute distress.  

## 2014-07-11 NOTE — ED Notes (Signed)
Pt left gold colored necklace/bracelet in the room. rn called and left message to number provided in snapshot. Jewelry is in lost in found at registration.

## 2014-07-11 NOTE — ED Notes (Signed)
Pt explain the reason for aspirin being ordered. Pt reports she get nausea from aspiring, rn explained that pt also has ordered zofran to help with any vomiting. Pt was on phone with sister and verified sisters identity and gave verbal consent for rn to talk about pts medical information in front of her to sister on the phone.

## 2014-07-11 NOTE — Discharge Instructions (Signed)

## 2014-07-11 NOTE — ED Notes (Addendum)
Per ems pt is from home. C/o left arm numbness and tingling since 0530. Hx of arthritis. Pt reports similar arm numbness and tingling episodes in past, but never radiated down arm before. Denies chest pain. Denies n/v. Pt reports she sleeps on her right side, so numbness is not from sleep position.

## 2014-07-11 NOTE — ED Notes (Signed)
Pt given phone upon request.

## 2014-07-11 NOTE — ED Notes (Signed)
md at bedside

## 2014-07-11 NOTE — ED Notes (Signed)
Pt to MRI

## 2014-07-11 NOTE — ED Notes (Signed)
Pt back from MRI 

## 2014-07-11 NOTE — ED Notes (Signed)
Pt has passed swallow screen. Per md pt allowed to have PO fluids.

## 2014-07-11 NOTE — ED Notes (Signed)
Pt alert and oriented x4. Respirations even and unlabored, bilateral symmetrical rise and fall of chest. Skin warm and dry. In no acute distress. Denies needs.   

## 2014-07-11 NOTE — ED Notes (Addendum)
md at bedside  Pt alert and oriented x4.but pt repsonds untypically to questions and demeanor is slightly strange. Respirations even and unlabored, bilateral symmetrical rise and fall of chest. Skin warm and dry. In no acute distress. Denies needs.

## 2014-07-11 NOTE — ED Provider Notes (Addendum)
CSN: [id number]     Arrival date & time 07/11/14  0715 History   First MD Initiated Contact with Patient 07/11/14 [phone]     Chief Complaint  Patient presents with  . left arm numbness and tingling      (Consider location/radiation/quality/duration/timing/severity/associated sxs/prior Treatment) HPI Comments: Patient presents to the ER for evaluation of the left arm numbness and tingling which she first noticed when she woke up this morning at 5:30 AM. Patient reports that upon awakening she noticed pain in her neck, left shoulder, following down her left arm. There is numbness and tingling in her fingers. Patient does report that she has noticed intermittent episodes of numbness in her left hand before, was told by her rheumatologist that was her rheumatoid arthritis. This is different, however. She has never had the entire arm hurt and tingling before. Patient denies any injury. Patient reports that the pain worsens when she turns her head or tries to raise the arm. There is no chest pain or shortness of breath.   Past Medical History  Diagnosis Date  . Rheumatoid arteritis    History reviewed. No pertinent past surgical history. History reviewed. No pertinent family history. History  Substance Use Topics  . Smoking status: Never Smoker   . Smokeless tobacco: Not on file  . Alcohol Use: No   OB History   Grav Para Term Preterm Abortions TAB SAB Ect Mult Living                 Review of Systems  Respiratory: Negative for shortness of breath.   Cardiovascular: Negative for chest pain.  Musculoskeletal: Positive for arthralgias.  All other systems reviewed and are negative.     Allergies  Methotrexate derivatives and Aspirin  Home Medications   Prior to Admission medications   Medication Sig Start Date End Date Taking? Authorizing Provider  albuterol (PROVENTIL HFA;VENTOLIN HFA) 108 (90 BASE) MCG/ACT inhaler Inhale 1-2 puffs into the lungs every 6 (six) hours as needed for  wheezing or shortness of breath.   Yes Historical Provider, MD  diclofenac sodium (VOLTAREN) 1 % GEL Apply 4 g topically 4 (four) times daily as needed (for joint pain).   Yes Historical Provider, MD  folic acid (FOLVITE) 1 MG tablet Take 1 mg by mouth daily.   Yes Historical Provider, MD  ibuprofen (ADVIL,MOTRIN) 800 MG tablet Take 800 mg by mouth every 8 (eight) hours as needed for mild pain.   Yes Historical Provider, MD  leflunomide (ARAVA) 10 MG tablet Take 10 mg by mouth daily.   Yes Historical Provider, MD  Multiple Vitamins-Minerals (MULTIVITAMIN PO) Take 1 tablet by mouth daily.   Yes Historical Provider, MD  omeprazole (PRILOSEC) 20 MG capsule Take 20 mg by mouth daily.   Yes Historical Provider, MD  predniSONE (DELTASONE) 5 MG tablet Take 5 mg by mouth daily as needed (when pt is alot of pain).   Yes Historical Provider, MD  ranitidine (ZANTAC) 150 MG tablet Take 150 mg by mouth 2 (two) times daily.   Yes Historical Provider, MD  sulfaSALAzine (AZULFIDINE) 500 MG EC tablet Take 1,000 mg by mouth 2 (two) times daily.   Yes Historical Provider, MD  HYDROcodone-acetaminophen (NORCO/VICODIN) 5-325 MG per tablet Take 2 tablets by mouth every 4 (four) hours as needed for moderate pain. 07/11/14   Gilda Crease, MD  predniSONE (DELTASONE) 20 MG tablet 3 tabs po daily x 3 days, then 2 tabs x 3 days, then 1.5 tabs x 3  days, then 1 tab x 3 days, then 0.5 tabs x 3 days 07/11/14   Gilda Crease, MD   BP 119/92  Pulse 103  Temp(Src) 98.1 F (36.7 C) (Oral)  Resp 18  SpO2 99% Physical Exam  Constitutional: She is oriented to person, place, and time. She appears well-developed and well-nourished. No distress.  HENT:  Head: Normocephalic and atraumatic.  Right Ear: Hearing normal.  Left Ear: Hearing normal.  Nose: Nose normal.  Mouth/Throat: Oropharynx is clear and moist and mucous membranes are normal.  Eyes: Conjunctivae and EOM are normal. Pupils are equal, round, and  reactive to light.  Neck: Neck supple. Muscular tenderness present. Decreased range of motion (Increased pain in the arm with turning head) present.  Cardiovascular: Regular rhythm, S1 normal and S2 normal.  Exam reveals no gallop and no friction rub.   No murmur heard. Pulmonary/Chest: Effort normal and breath sounds normal. No respiratory distress. She exhibits no tenderness.  Abdominal: Soft. Normal appearance and bowel sounds are normal. There is no hepatosplenomegaly. There is no tenderness. There is no rebound, no guarding, no tenderness at McBurney's point and negative Murphy's sign. No hernia.  Musculoskeletal:       Left shoulder: She exhibits tenderness.  Neurological: She is alert and oriented to person, place, and time. She has normal strength. No cranial nerve deficit or sensory deficit. Coordination normal. GCS eye subscore is 4. GCS verbal subscore is 5. GCS motor subscore is 6.  Extraocular muscle movement: normal No visual field cut Pupils: equal and reactive both direct and consensual response is normal No nystagmus present    Sensory function is intact to light touch, pinprick Proprioception intact  Grip strength 5/5 symmetric in upper extremities Lower extremity strength 5/5 against gravity No pronator drift Normal finger to nose bilaterally   Gait: normal    Skin: Skin is warm, dry and intact. No rash noted. No cyanosis.  Psychiatric: She has a normal mood and affect. Her speech is normal and behavior is normal. Thought content normal.    ED Course  Procedures (including critical care time) Labs Review Labs Reviewed  CBC WITH DIFFERENTIAL - Abnormal; Notable for the following:    RBC 3.67 (*)    Hemoglobin 10.6 (*)    HCT 33.1 (*)    All other components within normal limits  BASIC METABOLIC PANEL - Abnormal; Notable for the following:    Glucose, Bld 102 (*)    All other components within normal limits  TROPONIN I    Imaging Review Ct Head Wo  Contrast  07/11/2014   CLINICAL DATA:  Left upper extremity numbness  EXAM: CT HEAD WITHOUT CONTRAST  TECHNIQUE: Contiguous axial images were obtained from the base of the skull through the vertex without intravenous contrast.  COMPARISON:  None.  FINDINGS: The ventricles are normal in size and configuration. There is no appreciable mass, hemorrhage, extra-axial fluid collection, or midline shift.  There is decreased attenuation in the posterior superior right temporal lobe, a finding concerning for acute infarct in this area. Elsewhere gray-white compartments appear normal. The bony calvarium appears intact. The mastoid air cells are clear.  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 No hemorrhage. Elsewhere gray-white compartments appear normal.   Electronically Signed   By: Bretta Bang M.D.   On: 07/11/2014 08:49   Mr Brain Wo Contrast  07/11/2014   CLINICAL DATA:  Left arm numbness.  Abnormal CT  EXAM: MRI HEAD  WITHOUT CONTRAST  TECHNIQUE: Multiplanar, multiecho pulse sequences of the brain and surrounding structures were obtained without intravenous contrast.  COMPARISON:  CT head 07/11/2014  FINDINGS: Negative for acute infarct.  Right posterior temporal hypodensity on CT appears to be artifact due to motion. This area appears normal on MRI.  Negative for chronic ischemia.  Ventricle size is normal.  Cerebral volume is normal.  Negative for mass or edema  Image quality degraded by motion on several of the sequences. Patient was sedated but was not able to hold still  IMPRESSION: Negative for acute infarct.   Electronically Signed   By: Marlan Palau M.D.   On: 07/11/2014 12:36     EKG Interpretation   Date/Time:  Wednesday July 11 2014 07:49:45 EDT Ventricular Rate:  97 PR Interval:  164 QRS Duration: 80 QT Interval:  342 QTC Calculation: 434 R Axis:   52 Text Interpretation:  Sinus rhythm Baseline wander in lead(s) I Otherwise   within normal limits Confirmed by POLLINA  MD, CHRISTOPHER [phone]) on  07/11/2014 7:54:01 AM      MDM   Final diagnoses:  Cervical radiculopathy    Patient presents to the ER for evaluation of left arm pain, numbness and tingling. The patient has increased pain with range of motion of the neck and arm. He appears to be radicular in nature. She does have a history of rheumatoid arthritis, complaining of increased pain in the neck region. No recent falls or injury. She has a normal neurologic exam, no deficit noted.  CT head performed to further evaluate left arm numbness and tingling. Findings concerning for acute infarct in the posterior superior right temporal lobe. This did not, however, seem consistent with her current presentation. MRI was therefore performed to further evaluate. No acute infarct was seen. Patient will be treated for her rheumatoid arthritis with acute cervical radiculopathy. Follow up with her doctor and her rheumatologist.  Cardiac evaluation also performed because of the pain in the left arm. There is no chest pain, cardiac evaluation is normal.    Gilda Crease, MD 07/11/14 5217  Gilda Crease, MD 07/11/14 1525

## 2014-08-13 ENCOUNTER — Encounter (INDEPENDENT_AMBULATORY_CARE_PROVIDER_SITE_OTHER): Payer: Self-pay

## 2014-08-13 ENCOUNTER — Ambulatory Visit
Admission: RE | Admit: 2014-08-13 | Discharge: 2014-08-13 | Disposition: A | Discharge status: Home / Self Care | Payer: Medicaid Other | Source: Ambulatory Visit

## 2014-08-13 DIAGNOSIS — Z1231 Encounter for screening mammogram for malignant neoplasm of breast: Secondary | ICD-10-CM

## 2014-09-18 ENCOUNTER — Encounter (HOSPITAL_COMMUNITY): Payer: Self-pay | Admitting: Emergency Medicine

## 2014-09-18 ENCOUNTER — Inpatient Hospital Stay (HOSPITAL_COMMUNITY)
Admission: EM | Admit: 2014-09-18 | Discharge: 2014-09-20 | DRG: 545 | Disposition: A | Discharge status: Home Health | Payer: Medicaid Other | Attending: Family Medicine | Admitting: Family Medicine

## 2014-09-18 DIAGNOSIS — F329 Major depressive disorder, single episode, unspecified: Secondary | ICD-10-CM | POA: Diagnosis present

## 2014-09-18 DIAGNOSIS — Z91138 Patient's unintentional underdosing of medication regimen for other reason: Secondary | ICD-10-CM | POA: Diagnosis present

## 2014-09-18 DIAGNOSIS — Z886 Allergy status to analgesic agent status: Secondary | ICD-10-CM | POA: Diagnosis not present

## 2014-09-18 DIAGNOSIS — M2550 Pain in unspecified joint: Secondary | ICD-10-CM | POA: Diagnosis not present

## 2014-09-18 DIAGNOSIS — E876 Hypokalemia: Secondary | ICD-10-CM | POA: Diagnosis present

## 2014-09-18 DIAGNOSIS — T451X6A Underdosing of antineoplastic and immunosuppressive drugs, initial encounter: Secondary | ICD-10-CM | POA: Diagnosis present

## 2014-09-18 DIAGNOSIS — D649 Anemia, unspecified: Secondary | ICD-10-CM

## 2014-09-18 DIAGNOSIS — R630 Anorexia: Secondary | ICD-10-CM

## 2014-09-18 DIAGNOSIS — M0689 Other specified rheumatoid arthritis, multiple sites: Secondary | ICD-10-CM | POA: Diagnosis present

## 2014-09-18 DIAGNOSIS — Z79899 Other long term (current) drug therapy: Secondary | ICD-10-CM

## 2014-09-18 DIAGNOSIS — R634 Abnormal weight loss: Secondary | ICD-10-CM | POA: Diagnosis present

## 2014-09-18 DIAGNOSIS — E43 Unspecified severe protein-calorie malnutrition: Secondary | ICD-10-CM | POA: Diagnosis present

## 2014-09-18 DIAGNOSIS — E440 Moderate protein-calorie malnutrition: Secondary | ICD-10-CM | POA: Insufficient documentation

## 2014-09-18 DIAGNOSIS — Z7952 Long term (current) use of systemic steroids: Secondary | ICD-10-CM

## 2014-09-18 DIAGNOSIS — M069 Rheumatoid arthritis, unspecified: Secondary | ICD-10-CM | POA: Diagnosis present

## 2014-09-18 LAB — CBC WITH DIFFERENTIAL/PLATELET
Basophils Absolute: 0 10*3/uL (ref 0.0–0.1)
Basophils Relative: 1 % (ref 0–1)
EOS ABS: 0 10*3/uL (ref 0.0–0.7)
Eosinophils Relative: 0 % (ref 0–5)
HEMATOCRIT: 24.8 % — AB (ref 36.0–46.0)
Hemoglobin: 8 g/dL — ABNORMAL LOW (ref 12.0–15.0)
LYMPHS ABS: 1.9 10*3/uL (ref 0.7–4.0)
LYMPHS PCT: 22 % (ref 12–46)
MCH: 27.6 pg (ref 26.0–34.0)
MCHC: 32.3 g/dL (ref 30.0–36.0)
MCV: 85.5 fL (ref 78.0–100.0)
MONO ABS: 0.7 10*3/uL (ref 0.1–1.0)
Monocytes Relative: 8 % (ref 3–12)
Neutro Abs: 6 10*3/uL (ref 1.7–7.7)
Neutrophils Relative %: 69 % (ref 43–77)
Platelets: 518 10*3/uL — ABNORMAL HIGH (ref 150–400)
RBC: 2.9 MIL/uL — AB (ref 3.87–5.11)
RDW: 13.5 % (ref 11.5–15.5)
WBC: 8.6 10*3/uL (ref 4.0–10.5)

## 2014-09-18 LAB — CBC
HCT: 25.5 % — ABNORMAL LOW (ref 36.0–46.0)
Hemoglobin: 8 g/dL — ABNORMAL LOW (ref 12.0–15.0)
MCH: 26.9 pg (ref 26.0–34.0)
MCHC: 31.4 g/dL (ref 30.0–36.0)
MCV: 85.9 fL (ref 78.0–100.0)
PLATELETS: 558 10*3/uL — AB (ref 150–400)
RBC: 2.97 MIL/uL — ABNORMAL LOW (ref 3.87–5.11)
RDW: 13.5 % (ref 11.5–15.5)
WBC: 7.2 10*3/uL (ref 4.0–10.5)

## 2014-09-18 LAB — CREATININE, SERUM
Creatinine, Ser: 0.49 mg/dL — ABNORMAL LOW (ref 0.50–1.10)
GFR calc Af Amer: >90 mL/min (ref >90)
GFR calc non Af Amer: >90 mL/min (ref >90)

## 2014-09-18 LAB — BASIC METABOLIC PANEL
Anion gap: 16 — ABNORMAL HIGH (ref 5–15)
BUN: 11 mg/dL (ref 6–23)
CO2: 28 meq/L (ref 19–32)
Calcium: 9.1 mg/dL (ref 8.4–10.5)
Chloride: 96 mEq/L (ref 96–112)
Creatinine, Ser: 0.52 mg/dL (ref 0.50–1.10)
GFR calc Af Amer: >90 mL/min (ref >90)
GFR calc non Af Amer: >90 mL/min (ref >90)
GLUCOSE: 99 mg/dL (ref 70–99)
Potassium: 2.9 mEq/L — CL (ref 3.7–5.3)
Sodium: 140 mEq/L (ref 137–147)

## 2014-09-18 LAB — POC OCCULT BLOOD, ED: Fecal Occult Bld: NEGATIVE

## 2014-09-18 MED ORDER — ENOXAPARIN SODIUM 40 MG/0.4ML ~~LOC~~ SOLN
40.0000 mg | SUBCUTANEOUS | Status: DC
  Filled 2014-09-18 (×2): qty 0.4

## 2014-09-18 MED ORDER — HYDROXYCHLOROQUINE SULFATE 200 MG PO TABS
200.0000 mg | ORAL_TABLET | Freq: Every day | ORAL | Status: DC
  Administered 2014-09-18 – 2014-09-20 (×3): 200 mg via ORAL
  Filled 2014-09-18 (×3): qty 1

## 2014-09-18 MED ORDER — FAMOTIDINE IN NACL 20-0.9 MG/50ML-% IV SOLN: 20.0000 mg | Freq: Two times a day (BID) | INTRAVENOUS | Status: DC

## 2014-09-18 MED ORDER — ENSURE PUDDING PO PUDG: 1.0000 | Freq: Three times a day (TID) | ORAL | Status: DC

## 2014-09-18 MED ORDER — LEFLUNOMIDE 10 MG PO TABS: 10.0000 mg | ORAL_TABLET | Freq: Every day | ORAL | Status: DC

## 2014-09-18 MED ORDER — ONDANSETRON HCL 4 MG/2ML IJ SOLN: 4.0000 mg | Freq: Four times a day (QID) | INTRAMUSCULAR | Status: DC | PRN

## 2014-09-18 MED ORDER — FAMOTIDINE 20 MG PO TABS
20.0000 mg | ORAL_TABLET | Freq: Two times a day (BID) | ORAL | Status: DC
  Administered 2014-09-18 – 2014-09-20 (×4): 20 mg via ORAL
  Filled 2014-09-18 (×5): qty 1

## 2014-09-18 MED ORDER — INFLUENZA VAC SPLIT QUAD 0.5 ML IM SUSY
0.5000 mL | PREFILLED_SYRINGE | INTRAMUSCULAR | Status: AC
  Administered 2014-09-19: 0.5 mL via INTRAMUSCULAR
  Filled 2014-09-18: qty 0.5

## 2014-09-18 MED ORDER — HYDROMORPHONE HCL 1 MG/ML IJ SOLN
1.0000 mg | INTRAMUSCULAR | Status: AC | PRN
  Administered 2014-09-18: 1 mg via INTRAVENOUS
  Filled 2014-09-18: qty 1

## 2014-09-18 MED ORDER — ACETAMINOPHEN 650 MG RE SUPP: 650.0000 mg | Freq: Four times a day (QID) | RECTAL | Status: DC | PRN

## 2014-09-18 MED ORDER — HYDROMORPHONE HCL 1 MG/ML IJ SOLN
1.0000 mg | Freq: Once | INTRAMUSCULAR | Status: AC
  Administered 2014-09-18: 1 mg via INTRAVENOUS
  Filled 2014-09-18: qty 1

## 2014-09-18 MED ORDER — DICLOFENAC SODIUM 1 % TD GEL
4.0000 g | Freq: Four times a day (QID) | TRANSDERMAL | Status: DC | PRN
  Administered 2014-09-18 – 2014-09-20 (×2): 4 g via TOPICAL
  Filled 2014-09-18: qty 100

## 2014-09-18 MED ORDER — PANTOPRAZOLE SODIUM 40 MG PO TBEC
40.0000 mg | DELAYED_RELEASE_TABLET | Freq: Every day | ORAL | Status: DC
  Administered 2014-09-18 – 2014-09-20 (×3): 40 mg via ORAL
  Filled 2014-09-18 (×3): qty 1

## 2014-09-18 MED ORDER — METHYLPREDNISOLONE SODIUM SUCC 125 MG IJ SOLR
125.0000 mg | Freq: Once | INTRAMUSCULAR | Status: AC
  Administered 2014-09-18: 125 mg via INTRAVENOUS
  Filled 2014-09-18: qty 2

## 2014-09-18 MED ORDER — SODIUM CHLORIDE 0.9 % IV BOLUS (SEPSIS)
1000.0000 mL | Freq: Once | INTRAVENOUS | Status: AC
  Administered 2014-09-18: 1000 mL via INTRAVENOUS

## 2014-09-18 MED ORDER — ACETAMINOPHEN 325 MG PO TABS: 650.0000 mg | ORAL_TABLET | Freq: Four times a day (QID) | ORAL | Status: DC | PRN

## 2014-09-18 MED ORDER — LEFLUNOMIDE 10 MG PO TABS
20.0000 mg | ORAL_TABLET | Freq: Every day | ORAL | Status: DC
  Administered 2014-09-19 – 2014-09-20 (×2): 20 mg via ORAL
  Filled 2014-09-18 (×2): qty 2

## 2014-09-18 MED ORDER — DOCUSATE SODIUM 100 MG PO CAPS
100.0000 mg | ORAL_CAPSULE | Freq: Two times a day (BID) | ORAL | Status: DC
  Administered 2014-09-18 – 2014-09-20 (×4): 100 mg via ORAL
  Filled 2014-09-18 (×5): qty 1

## 2014-09-18 MED ORDER — HYDROMORPHONE HCL 1 MG/ML IJ SOLN
1.0000 mg | Freq: Once | INTRAMUSCULAR | Status: AC
Start: 2014-09-18 — End: 2014-09-18
  Administered 2014-09-18: 1 mg via INTRAVENOUS
  Filled 2014-09-18: qty 1

## 2014-09-18 MED ORDER — SULFASALAZINE 500 MG PO TBEC
1000.0000 mg | DELAYED_RELEASE_TABLET | Freq: Two times a day (BID) | ORAL | Status: DC
  Administered 2014-09-18 – 2014-09-20 (×4): 1000 mg via ORAL
  Filled 2014-09-18 (×5): qty 2

## 2014-09-18 MED ORDER — PREDNISONE 20 MG PO TABS
20.0000 mg | ORAL_TABLET | Freq: Every day | ORAL | Status: DC
  Administered 2014-09-19 – 2014-09-20 (×2): 20 mg via ORAL
  Filled 2014-09-18 (×3): qty 1

## 2014-09-18 MED ORDER — SENNA 8.6 MG PO TABS
1.0000 | ORAL_TABLET | Freq: Every day | ORAL | Status: DC | PRN
Start: 2014-09-18 — End: 2014-09-20
  Filled 2014-09-18: qty 1

## 2014-09-18 MED ORDER — HYDROCODONE-ACETAMINOPHEN 5-325 MG PO TABS
2.0000 | ORAL_TABLET | ORAL | Status: DC | PRN
  Administered 2014-09-19 – 2014-09-20 (×2): 2 via ORAL
  Filled 2014-09-18 (×3): qty 2

## 2014-09-18 MED ORDER — TRAMADOL HCL 50 MG PO TABS
50.0000 mg | ORAL_TABLET | Freq: Four times a day (QID) | ORAL | Status: DC | PRN
  Administered 2014-09-19: 50 mg via ORAL
  Filled 2014-09-18: qty 1

## 2014-09-18 MED ORDER — ONDANSETRON HCL 4 MG PO TABS: 4.0000 mg | ORAL_TABLET | Freq: Four times a day (QID) | ORAL | Status: DC | PRN

## 2014-09-18 MED ORDER — METHYLPREDNISOLONE SODIUM SUCC 125 MG IJ SOLR: 80.0000 mg | Freq: Two times a day (BID) | INTRAMUSCULAR | Status: DC

## 2014-09-18 MED ORDER — FOLIC ACID 1 MG PO TABS
1.0000 mg | ORAL_TABLET | Freq: Every day | ORAL | Status: DC
  Administered 2014-09-18 – 2014-09-20 (×3): 1 mg via ORAL
  Filled 2014-09-18 (×3): qty 1

## 2014-09-18 MED ORDER — POTASSIUM CHLORIDE CRYS ER 20 MEQ PO TBCR
40.0000 meq | EXTENDED_RELEASE_TABLET | Freq: Once | ORAL | Status: AC
  Administered 2014-09-18: 40 meq via ORAL
  Filled 2014-09-18: qty 2

## 2014-09-18 MED ORDER — ALBUTEROL SULFATE (2.5 MG/3ML) 0.083% IN NEBU: 2.5000 mg | INHALATION_SOLUTION | Freq: Four times a day (QID) | RESPIRATORY_TRACT | Status: DC | PRN

## 2014-09-18 MED ORDER — TIZANIDINE HCL 4 MG PO TABS
4.0000 mg | ORAL_TABLET | Freq: Four times a day (QID) | ORAL | Status: DC | PRN
  Administered 2014-09-18: 4 mg via ORAL
  Filled 2014-09-18 (×2): qty 2

## 2014-09-18 NOTE — ED Notes (Signed)
Dr.Knapp at bedside  

## 2014-09-18 NOTE — H&P (Addendum)
[facility] History and Physical  Kathy [name] ZOX:[id number] DOB: 04-21-1966 DOA: 09/18/2014   PCP: Dorrene German, MD    Chief Complaint: pain and swelling in multiple joints  HPI: Kathy [name] is a 48 y.o. female a past medical history of rheumatoid arthritis which a diagnosis that she's carried for about 25 years. The patient is treated by Dr. Zenovia Jordan. She has been taking Plaquenil sulfasalazine and prednisone appropriately however, noted increasing pain and swelling in multiple joints which started a little over a week ago. She knew she was going to run out of her prednisone and therefore asked her rheumatologist for refill but was asked to have blood work done prior to resuming Prednisone. In the interim, she ran out of her prednisone (a few days ago) and subsequently she developed increasing swelling and pain of her right knee ankle and foot and also noted increasing pain in her neck shoulders and hands. She states that she had extreme difficulty with ambulating essentially due to her right leg/foot issues and states that her pain is severe and is therefore being admitted for pain management. I have spoken with her rheumatologist who tells me that the prednisone that she was taking was a taper and there was no intention to continue it chronically. We have reviewed her outpatient medications and I'll be placing her back on them. The patient states that she stopped taking Arava a while back - we will be resuming this as well.   General: + anorexia,  weight loss of 25 lbs in past one month. No fever/ chills but has hot flashes.  Cardiac: Denies chest pain, syncope, palpitations, pedal edema  Respiratory: Denies cough, shortness of breath, wheezing GI: Denies severe indigestion/heartburn, abdominal pain, nausea, vomiting, diarrhea and constipation GU: Denies hematuria, incontinence, dysuria  Musculoskeletal: Denies arthritis  Skin: Denies suspicious skin  lesions Neurologic: Denies focal weakness or numbness, change in vision Psychiatry: mild depression due to death of nephew- no anxiety.   Past Medical History  Diagnosis Date  . Rheumatoid arteritis     History reviewed. No pertinent past surgical history.  Social History: does not smoke or drink alcohol  Lives at home alone   Allergies  Allergen Reactions  . Methotrexate Derivatives Shortness Of Breath and Nausea And Vomiting  . Aspirin Nausea And Vomiting    History reviewed. No pertinent family history.    Prior to Admission medications   Medication Sig Start Date End Date Taking? Authorizing Provider  albuterol (PROVENTIL HFA;VENTOLIN HFA) 108 (90 BASE) MCG/ACT inhaler Inhale 1-2 puffs into the lungs every 6 (six) hours as needed for wheezing or shortness of breath.   Yes Historical Provider, MD  diclofenac sodium (VOLTAREN) 1 % GEL Apply 4 g topically 4 (four) times daily as needed (for joint pain).   Yes Historical Provider, MD  folic acid (FOLVITE) 1 MG tablet Take 1 mg by mouth daily.   Yes Historical Provider, MD  HYDROcodone-acetaminophen (NORCO/VICODIN) 5-325 MG per tablet Take 2 tablets by mouth every 4 (four) hours as needed for moderate pain. 07/11/14  Yes Gilda Crease, MD  hydroxychloroquine (PLAQUENIL) 200 MG tablet Take 200 mg by mouth daily.   Yes Historical Provider, MD  leflunomide (ARAVA) 10 MG tablet Take 10 mg by mouth daily.   Yes Historical Provider, MD  Multiple Vitamins-Minerals (MULTIVITAMIN PO) Take 1 tablet by mouth daily.   Yes Historical Provider, MD  omeprazole (PRILOSEC) 20 MG capsule Take 20 mg by mouth daily.   Yes  Historical Provider, MD  predniSONE (DELTASONE) 5 MG tablet Take 5 mg by mouth daily as needed (when pt is alot of pain).   Yes Historical Provider, MD  ranitidine (ZANTAC) 150 MG tablet Take 150 mg by mouth 2 (two) times daily.   Yes Historical Provider, MD  sulfaSALAzine (AZULFIDINE) 500 MG EC tablet Take 1,000 mg by mouth  2 (two) times daily.   Yes Historical Provider, MD  tiZANidine (ZANAFLEX) 4 MG tablet Take 4-8 mg by mouth every 6 (six) hours as needed for muscle spasms.   Yes Historical Provider, MD  traMADol (ULTRAM) 50 MG tablet Take 50 mg by mouth every 6 (six) hours as needed (pain).   Yes Historical Provider, MD  predniSONE (DELTASONE) 20 MG tablet 3 tabs po daily x 3 days, then 2 tabs x 3 days, then 1.5 tabs x 3 days, then 1 tab x 3 days, then 0.5 tabs x 3 days Patient not taking: Reported on 09/18/2014 07/11/14   Gilda Crease, MD     Physical Exam: Filed Vitals:   09/18/14 1215 09/18/14 1230 09/18/14 1326 09/18/14 1423  BP: 107/57 104/54  121/58  Pulse: 104 105  114  Temp:    98.9 F (37.2 C)  TempSrc:    Oral  Resp: 18 18  15   Height:   5\' 3"  (1.6 m)   Weight:   74.435 kg (164 lb 1.6 oz)   SpO2: 92% 93%  100%     General: AAo x3, no acute distress HEENT: Normocephalic and Atraumatic, Mucous membranes pink                PERRLA; EOM intact; No scleral icterus,                 Nares: Patent, Oropharynx: Clear, Fair Dentition                 Neck: FROM, no cervical lymphadenopathy, thyromegaly, carotid bruit or JVD;  Breasts: deferred CHEST WALL: No tenderness  CHEST: Normal respiration, clear to auscultation bilaterally  HEART: Regular rate and rhythm; tachycardic, no murmurs rubs or gallops  BACK: No kyphosis or scoliosis; no CVA tenderness  ABDOMEN: Positive Bowel Sounds, soft, non-tender; no masses, no organomegaly Rectal Exam: deferred EXTREMITIES: No cyanosis, clubbing, or edema Musculoskeletal: Tenderness in C-spine bilateral shoulders hands, fingers right knee ankle and foot. Swelling noted in right knee, right ankle and right foot.  Genitalia: not examined  SKIN:  no rash or ulceration  CNS: Alert and Oriented x 4, Nonfocal exam, CN 2-12 intact  Labs on Admission:  Basic Metabolic Panel:  Recent Labs Lab 09/18/14 0922  NA 140  K 2.9*  CL 96  CO2 28   GLUCOSE 99  BUN 11  CREATININE 0.52  CALCIUM 9.1   Liver Function Tests: No results for input(s): AST, ALT, ALKPHOS, BILITOT, PROT, ALBUMIN in the last 168 hours. No results for input(s): LIPASE, AMYLASE in the last 168 hours. No results for input(s): AMMONIA in the last 168 hours. CBC:  Recent Labs Lab 09/18/14 0922  WBC 8.6  NEUTROABS 6.0  HGB 8.0*  HCT 24.8*  MCV 85.5  PLT 518*   Cardiac Enzymes: No results for input(s): CKTOTAL, CKMB, CKMBINDEX, TROPONINI in the last 168 hours.  BNP (last 3 results) No results for input(s): PROBNP in the last 8760 hours. CBG: No results for input(s): GLUCAP in the last 168 hours.  Radiological Exams on Admission: No results found.   Assessment/Plan Principal Problem:  Rheumatoid arthritis flare - resume all outpt medications- this includes Arava 20 mg daily, Plaquenil 200 mg daily, Sulfasalazine 1000 mg BID -The patient was also supposed to be taking Methotrexate 2.5 mg tabs- 10 tabs weekly however, the patient tells me that she vomits every time she takes this and suspects she is allergic to them and therefore, she has not been taking them - per Dr Lendon Colonel, we should place her on 20 mg of Prednisone and taper by 5 mg every 3 days. She has received Solumedrol today- will begin Prednisone tomorrow.  - control pain with PRN Hydrocodone and use Dilaudid for breakthrough pain -- Dr Lendon Colonel feels that neck pain is related to osteoarthritis rather than rheumatoid arthritis (which only affects C1)- May need to add NSAIDS- check stool hemoccult first - see below - PT eval  Active Problems:   Hypokalemia - due to poor PO intake? - replace, check Mg in AM    Loss of weight- severe protein calorie malnutrition due to Anorexia - add nutritional supplements    Anemia - check anemia panel- may be AOCD - check stool hemoccults- may have gastritis and slow GI bleed in relation to Prednisone use?- she has been on Protonix and Pepcid.     Consulted: phone consult with Dr Orlin Hilding  Code Status: full code Family Communication: none  DVT Prophylaxis:Lovenox  Time spent:  60 min  Tashayla Therien, MD [facility]  If 7PM-7AM, please contact night-coverage [email or web address] 09/18/2014, 3:30 PM

## 2014-09-18 NOTE — ED Provider Notes (Signed)
CSN: [id number]     Arrival date & time 09/18/14  6578 History   First MD Initiated Contact with Patient 09/18/14 [phone]     Chief Complaint  Patient presents with  . Rheumatoid Arthritis    HPI Patient presents to the emergency room with complaints of joint pain. Patient has a history of rheumatoid arthritis.   She had been taking prednisone.   She ran out of that medication about a week ago and following that started having pain diffusely in her joints. Pain is in her right shoulder, right knee, right ankle and her neck region. The pain increases with movement and palpation. Those joints feel swollen. She was supposed to see her doctor and have blood test today but the pain was very severe and she did not feel that she could make it there. She denies any fevers or chills. No recent injuries. No trouble with chest pain or shortness of breath. Past Medical History  Diagnosis Date  . Rheumatoid arteritis    History reviewed. No pertinent past surgical history. History reviewed. No pertinent family history. History  Substance Use Topics  . Smoking status: Never Smoker   . Smokeless tobacco: Not on file  . Alcohol Use: No   OB History    No data available     Review of Systems  All other systems reviewed and are negative.     Allergies  Methotrexate derivatives and Aspirin  Home Medications   Prior to Admission medications   Medication Sig Start Date End Date Taking? Authorizing Provider  albuterol (PROVENTIL HFA;VENTOLIN HFA) 108 (90 BASE) MCG/ACT inhaler Inhale 1-2 puffs into the lungs every 6 (six) hours as needed for wheezing or shortness of breath.   Yes Historical Provider, MD  diclofenac sodium (VOLTAREN) 1 % GEL Apply 4 g topically 4 (four) times daily as needed (for joint pain).   Yes Historical Provider, MD  folic acid (FOLVITE) 1 MG tablet Take 1 mg by mouth daily.   Yes Historical Provider, MD  HYDROcodone-acetaminophen (NORCO/VICODIN) 5-325 MG per tablet Take 2  tablets by mouth every 4 (four) hours as needed for moderate pain. 07/11/14  Yes Gilda Crease, MD  hydroxychloroquine (PLAQUENIL) 200 MG tablet Take 200 mg by mouth daily.   Yes Historical Provider, MD  leflunomide (ARAVA) 10 MG tablet Take 10 mg by mouth daily.   Yes Historical Provider, MD  Multiple Vitamins-Minerals (MULTIVITAMIN PO) Take 1 tablet by mouth daily.   Yes Historical Provider, MD  omeprazole (PRILOSEC) 20 MG capsule Take 20 mg by mouth daily.   Yes Historical Provider, MD  predniSONE (DELTASONE) 5 MG tablet Take 5 mg by mouth daily as needed (when pt is alot of pain).   Yes Historical Provider, MD  ranitidine (ZANTAC) 150 MG tablet Take 150 mg by mouth 2 (two) times daily.   Yes Historical Provider, MD  sulfaSALAzine (AZULFIDINE) 500 MG EC tablet Take 1,000 mg by mouth 2 (two) times daily.   Yes Historical Provider, MD  tiZANidine (ZANAFLEX) 4 MG tablet Take 4-8 mg by mouth every 6 (six) hours as needed for muscle spasms.   Yes Historical Provider, MD  traMADol (ULTRAM) 50 MG tablet Take 50 mg by mouth every 6 (six) hours as needed (pain).   Yes Historical Provider, MD  predniSONE (DELTASONE) 20 MG tablet 3 tabs po daily x 3 days, then 2 tabs x 3 days, then 1.5 tabs x 3 days, then 1 tab x 3 days, then 0.5 tabs x 3  days Patient not taking: Reported on 09/18/2014 07/11/14   Gilda Crease, MD   BP 123/67 mmHg  Pulse 107  Temp(Src) 99.8 F (37.7 C) (Oral)  Resp 17  Ht 5\' 3"  (1.6 m)  SpO2 91% Physical Exam  Constitutional: She appears well-developed and well-nourished. No distress.  HENT:  Head: Normocephalic and atraumatic.  Right Ear: External ear normal.  Left Ear: External ear normal.  Eyes: Conjunctivae are normal. Right eye exhibits no discharge. Left eye exhibits no discharge. No scleral icterus.  Neck: Neck supple. No tracheal deviation present.  Cardiovascular: Normal rate, regular rhythm and intact distal pulses.   Pulmonary/Chest: Effort normal and  breath sounds normal. No stridor. No respiratory distress. She has no wheezes. She has no rales.  Abdominal: Soft. Bowel sounds are normal. She exhibits no distension. There is no tenderness. There is no rebound and no guarding.  Genitourinary: Rectal exam shows no mass and no tenderness. Guaiac negative stool.  Brown stool  Musculoskeletal: She exhibits no edema.       Right wrist: She exhibits tenderness. She exhibits no swelling.       Right knee: She exhibits decreased range of motion, swelling and effusion. Tenderness found.       Right ankle: She exhibits decreased range of motion and swelling. She exhibits no deformity. Tenderness (pain with range of motion).       Cervical back: She exhibits tenderness. She exhibits no swelling.  Neurological: She is alert. She has normal strength. No cranial nerve deficit (no facial droop, extraocular movements intact, no slurred speech) or sensory deficit. She exhibits normal muscle tone. She displays no seizure activity. Coordination normal.  Skin: Skin is warm and dry. No rash noted.  Psychiatric: She has a normal mood and affect.  Nursing note and vitals reviewed.   ED Course  Procedures (including critical care time) Labs Review Labs Reviewed  CBC WITH DIFFERENTIAL - Abnormal; Notable for the following:    RBC 2.90 (*)    Hemoglobin 8.0 (*)    HCT 24.8 (*)    Platelets 518 (*)    All other components within normal limits  BASIC METABOLIC PANEL - Abnormal; Notable for the following:    Potassium 2.9 (*)    Anion gap 16 (*)    All other components within normal limits  POC OCCULT BLOOD, ED   Medications  HYDROmorphone (DILAUDID) injection 1 mg (1 mg Intravenous Given 09/18/14 0945)  methylPREDNISolone sodium succinate (SOLU-MEDROL) 125 mg/2 mL injection 125 mg (125 mg Intravenous Given 09/18/14 0945)  sodium chloride 0.9 % bolus 1,000 mL (0 mLs Intravenous Stopped 09/18/14 1059)  potassium chloride SA (K-DUR,KLOR-CON) CR tablet 40 mEq  (40 mEq Oral Given 09/18/14 1108)  HYDROmorphone (DILAUDID) injection 1 mg (1 mg Intravenous Given 09/18/14 1108)   1141  Still having significant pain.  Finding it difficult to move.  MDM   Final diagnoses:  Rheumatoid arthritis flare  Anemia, unspecified anemia type    The patient is having pain associated with an acute on chronic polyarthralgia. I suspect her symptoms are related to a flare of her rheumatoid arthritis. Symptoms did start when she ran out of her oral steroids.  Additionally the patient is also anemic, and this is worse since her most recent laboratory tests. She has not noticed any blood in her stool. Hemoccult here is negative. Doubt acute GI bleeding.  Patient was treated with pain medications in the emergency department. She still having significant discomfort. I will consult  the medical service regarding admission for treatment of her arthritis flare   Linwood Dibbles, MD 09/18/14 [phone]

## 2014-09-18 NOTE — ED Notes (Signed)
Report attempted 

## 2014-09-18 NOTE — ED Notes (Signed)
PT having rheumatoid arthritis flair. Joints visibly swollen.

## 2014-09-18 NOTE — Progress Notes (Signed)
09/18/14  Patient coming to [facility] with Rheumatoid Arthritis Flare,IV site RLFA NSL, regular diet,currently on Room air,and VS routine.

## 2014-09-19 LAB — RETICULOCYTES
RBC.: 2.69 MIL/uL — AB (ref 3.87–5.11)
RETIC COUNT ABSOLUTE: 53.8 10*3/uL (ref 19.0–186.0)
Retic Ct Pct: 2 % (ref 0.4–3.1)

## 2014-09-19 LAB — CBC
HEMATOCRIT: 23.4 % — AB (ref 36.0–46.0)
HEMOGLOBIN: 7.3 g/dL — AB (ref 12.0–15.0)
MCH: 27.1 pg (ref 26.0–34.0)
MCHC: 31.2 g/dL (ref 30.0–36.0)
MCV: 87 fL (ref 78.0–100.0)
Platelets: 577 10*3/uL — ABNORMAL HIGH (ref 150–400)
RBC: 2.69 MIL/uL — ABNORMAL LOW (ref 3.87–5.11)
RDW: 13.7 % (ref 11.5–15.5)
WBC: 7.8 10*3/uL (ref 4.0–10.5)

## 2014-09-19 LAB — IRON AND TIBC
IRON: 68 ug/dL (ref 42–135)
SATURATION RATIOS: 40 % (ref 20–55)
TIBC: 169 ug/dL — AB (ref 250–470)
UIBC: 101 ug/dL — AB (ref 125–400)

## 2014-09-19 LAB — FOLATE: Folate: >20 ng/mL

## 2014-09-19 LAB — BASIC METABOLIC PANEL
Anion gap: 11 (ref 5–15)
BUN: 11 mg/dL (ref 6–23)
CO2: 29 meq/L (ref 19–32)
Calcium: 8.8 mg/dL (ref 8.4–10.5)
Chloride: 100 mEq/L (ref 96–112)
Creatinine, Ser: 0.58 mg/dL (ref 0.50–1.10)
GFR calc Af Amer: >90 mL/min (ref >90)
GFR calc non Af Amer: >90 mL/min (ref >90)
GLUCOSE: 90 mg/dL (ref 70–99)
Potassium: 3.1 mEq/L — ABNORMAL LOW (ref 3.7–5.3)
SODIUM: 140 meq/L (ref 137–147)

## 2014-09-19 LAB — TYPE AND SCREEN
ABO/RH(D): O POS
ANTIBODY SCREEN: NEGATIVE

## 2014-09-19 LAB — VITAMIN B12: Vitamin B-12: 495 pg/mL (ref 211–911)

## 2014-09-19 LAB — FERRITIN: Ferritin: 403 ng/mL — ABNORMAL HIGH (ref 10–291)

## 2014-09-19 LAB — MAGNESIUM: MAGNESIUM: 1.9 mg/dL (ref 1.5–2.5)

## 2014-09-19 LAB — ABO/RH: ABO/RH(D): O POS

## 2014-09-19 MED ORDER — ENSURE COMPLETE PO LIQD
237.0000 mL | Freq: Three times a day (TID) | ORAL | Status: DC
  Administered 2014-09-19: 237 mL via ORAL

## 2014-09-19 MED ORDER — POTASSIUM CHLORIDE CRYS ER 20 MEQ PO TBCR
40.0000 meq | EXTENDED_RELEASE_TABLET | Freq: Once | ORAL | Status: AC
  Administered 2014-09-19: 40 meq via ORAL
  Filled 2014-09-19: qty 2

## 2014-09-19 NOTE — Plan of Care (Signed)
Problem: Phase II Progression Outcomes Goal: Progress activity as tolerated unless otherwise ordered Outcome: Progressing Goal: IV changed to normal saline lock Outcome: Completed/Met Date Met:  09/19/14  Problem: Phase III Progression Outcomes Goal: Voiding independently Outcome: Completed/Met Date Met:  09/19/14 Goal: Foley discontinued Outcome: Not Applicable Date Met:  06/28/00

## 2014-09-19 NOTE — Progress Notes (Signed)
[facility] PROGRESS NOTE  Kathy [name] GMW:[id number] DOB: 18-Aug-1966 DOA: 09/18/2014 PCP: Dorrene German, MD  Assessment/Plan:  Rheumatoid Arthritis Flare -Hasnt been taking Methotrexate due to vomiting.  -Received IV Solumedrol x2 dose -Resume all home meds: Methotrexate 2.5mg  daily, Arava 20mg  daily, Plaquenil 200mg  daily, Sulfasalazine 1000mg  BID -Per PCP Dr.  - Place on a 20mg  Prednisone, taper by 5mg  q 3 days. Neck pain likely due to OA rather than RA, and may need NSAIDs.  -Hydrocodone and Dilaudid PRN -No PT followup recommended- Home with walker  Hypokalemia -K 3.1, normal Mg -Replace with KDUR X1 -BMET in am  Normocytic Anemia -Hgb 7.3- Likely of chronic disease -Anemia panel normal, FOBT negative -Type and screen -Continue Folic acid -CBC in am  Weight Loss -Severe protein calorie malnutrition from anorexia -Continue nutritional supplements  DVT Prophylaxis: SCDs Code Status: Full Family Communication: Family at bedside Disposition Plan: Home when stable   Consultants:  None  Procedures:  None  Antibiotics:  None  HPI/Subjective: Kathy [name] is a 48 yo female with PMH of Rheumatoid Arthritis that presents with multiple joint pain and swelling for the past week. She is managed by Dr.  and was placed on a  prednisone taper, that she just completed, then subsequently developed the pain. She also states that she has not been taking her Methotrexate due to vomiting, and has not been taking Arava for a while. The pain and swelling is over the right knee/ankle/foot and has severe pain in her neck shoulders and hands. She states the pain is severe enough that she has been able to ambulate. She denies any dizziness, lightheadedness, chest pain or sob.  Admitting doctor spoke with Dr. who recommended a prednisone taper and pain management.  She is admitted for further management.  States pain is still extreme 9/10,  worse in her shoulders and neck.  Denies lightheadedness, dizziness, chest pain, or sob.  Objective: Filed Vitals:   09/19/14 0604  BP: 114/55  Pulse: 101  Temp: 98.6 F (37 C)  Resp: 18    Intake/Output Summary (Last 24 hours) at 09/19/14 52 Last data filed at 09/18/14 1925  Gross per 24 hour  Intake    240 ml  Output      0 ml  Net    240 ml   Filed Weights   09/18/14 1326  Weight: 74.435 kg (164 lb 1.6 oz)    Exam:  Gen: Alert and oriented AA female in NAD. Sitting in chair HEENT: Normocephalic, atraumatic.  Pupils symmertrical. Pale conjunctivae. Tenderness over neck and shoulder.  Moist mucosa.   Chest: clear to auscultate bilaterally, no ronchi or rales  Cardiac: Regular rate and rhythm, S1-S2, no rubs murmurs or gallops  Abdomen: soft, non tender, non distended, +bowel sounds. No guarding or rigidity  Extremities: Symmetrical in appearance. Hands with swelling and tenderness over wrist, MCP and PIP joints. Swelling in bilateral knee joints and feet.   Neurological: Alert awake oriented to time place and person.  Psychiatric: Appears normal.   Data Reviewed: Basic Metabolic Panel:  Recent Labs Lab 09/18/14 0922 09/18/14 1607 09/19/14 0535  NA 140  --  140  K 2.9*  --  3.1*  CL 96  --  100  CO2 28  --  29  GLUCOSE 99  --  90  BUN 11  --  11  CREATININE 0.52 0.49* 0.58  CALCIUM 9.1  --  8.8  MG  --   --  1.9   Liver Function Tests: No results for input(s): AST, ALT, ALKPHOS, BILITOT, PROT, ALBUMIN in the last 168 hours. No results for input(s): LIPASE, AMYLASE in the last 168 hours. No results for input(s): AMMONIA in the last 168 hours. CBC:  Recent Labs Lab 09/18/14 0922 09/18/14 1607 09/19/14 0535  WBC 8.6 7.2 7.8  NEUTROABS 6.0  --   --   HGB 8.0* 8.0* 7.3*  HCT 24.8* 25.5* 23.4*  MCV 85.5 85.9 87.0  PLT 518* 558* 577*   Cardiac Enzymes: No results for input(s): CKTOTAL, CKMB, CKMBINDEX, TROPONINI in the last 168 hours. BNP (last 3  results) No results for input(s): PROBNP in the last 8760 hours. CBG: No results for input(s): GLUCAP in the last 168 hours.  No results found for this or any previous visit (from the past 240 hour(s)).   Studies: No results found.  Scheduled Meds: . docusate sodium  100 mg Oral BID  . enoxaparin (LOVENOX) injection  40 mg Subcutaneous Q24H  . famotidine  20 mg Oral BID  . feeding supplement (ENSURE)  1 Container Oral TID BM  . folic acid  1 mg Oral Daily  . hydroxychloroquine  200 mg Oral Daily  . Influenza vac split quadrivalent PF  0.5 mL Intramuscular Tomorrow-1000  . leflunomide  20 mg Oral Daily  . pantoprazole  40 mg Oral Daily  . predniSONE  20 mg Oral Q breakfast  . sulfaSALAzine  1,000 mg Oral BID   Continuous Infusions:   Principal Problem:   Rheumatoid arthritis flare Active Problems:   Hypokalemia   Loss of weight   Anorexia   Anemia    Time spent: 54    Illa Level [facility]  [facility] Pager [phone]. If 7PM-7AM, please contact night-coverage at [email or web address], password [facility] 09/19/2014, 9:52 AM  LOS: 1 day

## 2014-09-19 NOTE — Progress Notes (Signed)
INITIAL NUTRITION ASSESSMENT  DOCUMENTATION CODES Per approved criteria  -Non-severe (moderate) malnutrition in the context of acute illness or injury  Pt meets criteria for NON-SEVERE (MODERATE_ MALNUTRITION in the context of Acute illness as evidenced by estimated energy intake <75% of estimated energy needs for > 7 days, mild wasting of muscle mass, and reported 14% weight loss in one month.  INTERVENTION: Provide Ensure Complete TID, each supplement provides 350 kcal and 13 grams of protein Encourage PO intake  NUTRITION DIAGNOSIS: Inadequate oral intake related to poor appetite and pain as evidenced by reported 25 lb weight loss in one month.   Goal: Pt to meet >/= 90% of their estimated nutrition needs   Monitor:  PO intake, weight trend, labs  Reason for Assessment: Malnutrition Screening Tool, score of 4  48 y.o. female  Admitting Dx: Rheumatoid arthritis flare  ASSESSMENT: 48 y.o. female a past medical history of rheumatoid arthritis which a diagnosis that she's carried for about 25 years. She ran out of her prednisone (a few days ago) and subsequently she developed increasing swelling and pain of her right knee ankle and foot and also noted increasing pain in her neck shoulders and hands. She states that she had extreme difficulty with ambulating essentially due to her right leg/foot issues and states that her pain is severe and is therefore being admitted for pain management.  Patient states that she usually eats well and has a good appetite but, when her arthritis flares-up she is in so much pain she doesn't feel like eating much. She reports losing 25 lbs in the past month, losing down to 154 lbs just PTA. She reports eating just a few bites of food throughout the day for the past week and a half. Pt ate a few bites of salad and a couple bites of cake for lunch. She reports severe reflux- she is unable to eat onions, tomato, or chocolate. Per nursing notes, she at 50% of  breakfast today. Patient reports she does not like the Ensure pudding- she is agreeable to trying Ensure Complete.  RD encouraged PO intake; recommended small frequent meals when appetite is poor as well as use of nutritional supplements to prevent rapid weight loss.   Nutrition Focused Physical Exam:  Subcutaneous Fat:  Orbital Region: wnl Upper Arm Region: wnl Thoracic and Lumbar Region: NA  Muscle:  Temple Region: mild wasting Clavicle Bone Region: mild wasting Clavicle and Acromion Bone Region: wnl Scapular Bone Region: mild wasting Dorsal Hand: mild wasting Patellar Region: wnl (swollen right knee) Anterior Thigh Region: wnl Posterior Calf Region: mild wasting  Edema: none noted  Height: Ht Readings from Last 1 Encounters:  09/18/14 5\' 3"  (1.6 m)    Weight: Wt Readings from Last 1 Encounters:  09/18/14 164 lb 1.6 oz (74.435 kg)    Ideal Body Weight: 115 lbs  % Ideal Body Weight: 143%  Wt Readings from Last 10 Encounters:  09/18/14 164 lb 1.6 oz (74.435 kg)  05/20/10 208 lb (94.348 kg)  02/04/10 211 lb (95.709 kg)    Usual Body Weight: 179 lbs  % Usual Body Weight: 92%  BMI:  Body mass index is 29.08 kg/(m^2).  Estimated Nutritional Needs: Kcal: 1750-2000 Protein: 80-90 grams Fluid: 2 L/day  Skin: +2 RLE and LLE edema per nursing notes  Diet Order: Diet regular  EDUCATION NEEDS: -No education needs identified at this time   Intake/Output Summary (Last 24 hours) at 09/19/14 1433 Last data filed at 09/19/14 1420  Gross per  24 hour  Intake    360 ml  Output      0 ml  Net    360 ml    Last BM: 11/23  Labs:   Recent Labs Lab 09/18/14 0922 09/18/14 1607 09/19/14 0535  NA 140  --  140  K 2.9*  --  3.1*  CL 96  --  100  CO2 28  --  29  BUN 11  --  11  CREATININE 0.52 0.49* 0.58  CALCIUM 9.1  --  8.8  MG  --   --  1.9  GLUCOSE 99  --  90    CBG (last 3)  No results for input(s): GLUCAP in the last 72 hours.  Scheduled Meds: .  docusate sodium  100 mg Oral BID  . famotidine  20 mg Oral BID  . feeding supplement (ENSURE)  1 Container Oral TID BM  . folic acid  1 mg Oral Daily  . hydroxychloroquine  200 mg Oral Daily  . leflunomide  20 mg Oral Daily  . pantoprazole  40 mg Oral Daily  . predniSONE  20 mg Oral Q breakfast  . sulfaSALAzine  1,000 mg Oral BID    Continuous Infusions:   Past Medical History  Diagnosis Date  . Rheumatoid arteritis     History reviewed. No pertinent past surgical history.  Ian Malkin RD, LDN Inpatient Clinical Dietitian Pager: [phone] After Hours Pager: [phone]

## 2014-09-19 NOTE — Progress Notes (Signed)
Utilization review completed. Cydne Grahn, RN, BSN. 

## 2014-09-19 NOTE — Evaluation (Signed)
Physical Therapy Evaluation Patient Details Name: Kathy [name] MRN: [id number] DOB: 1966/05/31 Today's Date: 09/19/2014   History of Present Illness  Pt is a 48 y.o. female with a PMH of RA which a diagnosis that she's carried for about 25 years. She has been taking Plaquenil sulfasalazine and prednisone appropriately however, noted increasing pain and swelling in multiple joints which started a little over a week ago. She knew she was going to run out of her prednisone and therefore asked her rheumatologist for refill but was asked to have blood work done prior to resuming Prednisone. In the interim, she ran out of her prednisone (a few days PTA) and subsequently she developed increasing swelling and pain of her right knee ankle and foot and also noted increasing pain in her neck shoulders and hands. She states that she had extreme difficulty with ambulating essentially due to her right leg/foot issues and states that her pain is severe. Pt is being admitted for pain management.   Clinical Impression  Pt admitted with the above. Pt currently with functional limitations due to the deficits listed below (see PT Problem List). At the time of PT eval pt was able to perform transfers and minimal ambulation with min assist. Pt reports severe pain, and focus of eval was pt comfort in the recliner. Instant heat packs provided for neck/shoulder pain. Pt will benefit from skilled PT to increase their independence and safety with mobility to allow discharge to the venue listed below.       Follow Up Recommendations No PT follow up    Equipment Recommendations  Rolling walker with 5" wheels;Other (comment) (Tub bench)    Recommendations for Other Services       Precautions / Restrictions Precautions Precautions: Fall Restrictions Weight Bearing Restrictions: No      Mobility  Bed Mobility               General bed mobility comments: Pt in recliner upon PT arrival.   Transfers Overall  transfer level: Needs assistance Equipment used: Rolling walker (2 wheeled) Transfers: Sit to/from [employer] Transfers   Stand pivot transfers: Mod assist       General transfer comment: Assist to power-up to full stand. Pt essentially could not use UE's for push-off support due to acute pain from RA flare. RW helpful initially but once began pivot transfer it just got in the way and was taken out of the way  Ambulation/Gait Ambulation/Gait assistance: Min assist Ambulation Distance (Feet): 4 Feet Assistive device: Rolling walker (2 wheeled) Gait Pattern/deviations: Shuffle;Trunk flexed;Narrow base of support Gait velocity: Decreased Gait velocity interpretation: Below normal speed for age/gender General Gait Details: Pt with difficulty managing walker. May do better without it for short distances.  Stairs            Wheelchair Mobility    Modified Rankin (Stroke Patients Only)       Balance Overall balance assessment: Needs assistance Sitting-balance support: Feet supported;No upper extremity supported Sitting balance-Leahy Scale: Fair     Standing balance support: No upper extremity supported Standing balance-Leahy Scale: Fair                               Pertinent Vitals/Pain Pain Assessment: 0-10 Pain Score: 10-Worst pain ever Pain Location: Neck, shoulders and back. R knee>L knee Pain Intervention(s): Limited activity within patient's tolerance;Monitored during session    Home Living Family/patient expects to be discharged to::  Private residence Living Arrangements: Alone Available Help at Discharge: Family;Available 24 hours/day Type of Home: Apartment Home Access: Level entry     Home Layout: One level Home Equipment: Walker - 2 wheels      Prior Function Level of Independence: Independent         Comments: Does not work - on disability. Does not drive. Family assists with getting groceries. Is able to do the cooking and  the cleaning at home.      Hand Dominance   Dominant Hand: Right    Extremity/Trunk Assessment   Upper Extremity Assessment: RUE deficits/detail;LUE deficits/detail RUE Deficits / Details: RA flare in hand - does not allow for proper support through RW.      LUE Deficits / Details: RA flare in hand - does not allow for proper support through RW.    Lower Extremity Assessment: RLE deficits/detail;LLE deficits/detail RLE Deficits / Details: RA flare - significant pain in R knee LLE Deficits / Details: Acute pain from RA flare  Cervical / Trunk Assessment: Normal  Communication   Communication: No difficulties  Cognition Arousal/Alertness: Awake/alert Behavior During Therapy: WFL for tasks assessed/performed Overall Cognitive Status: Within Functional Limits for tasks assessed                      General Comments      Exercises        Assessment/Plan    PT Assessment Patient needs continued PT services  PT Diagnosis Difficulty walking;Generalized weakness   PT Problem List Decreased strength;Decreased range of motion;Decreased activity tolerance;Decreased balance;Decreased mobility;Decreased knowledge of use of DME;Decreased safety awareness;Decreased knowledge of precautions;Cardiopulmonary status limiting activity;Pain  PT Treatment Interventions DME instruction;Gait training;Stair training;Functional mobility training;Therapeutic exercise;Therapeutic activities;Neuromuscular re-education;Patient/family education   PT Goals (Current goals can be found in the Care Plan section) Acute Rehab PT Goals Patient Stated Goal: Return home, decrease pain PT Goal Formulation: With patient Time For Goal Achievement: 09/26/14 Potential to Achieve Goals: Good    Frequency Min 3X/week   Barriers to discharge Decreased caregiver support Pt lives alone    Co-evaluation               End of Session Equipment Utilized During Treatment: Gait belt Activity  Tolerance: Patient limited by pain Patient left: in chair;with call bell/phone within reach;with chair alarm set Nurse Communication: Mobility status         Time: 0912-0937 PT Time Calculation (min) (ACUTE ONLY): 25 min   Charges:   PT Evaluation $Initial PT Evaluation Tier I: 1 Procedure PT Treatments $Gait Training: 8-22 mins $Therapeutic Activity: 8-22 mins   PT G Codes:          Conni Slipper 09/19/2014, 9:52 AM   Conni Slipper, PT, DPT Acute Rehabilitation Services Pager: [phone]

## 2014-09-20 DIAGNOSIS — E440 Moderate protein-calorie malnutrition: Secondary | ICD-10-CM | POA: Insufficient documentation

## 2014-09-20 LAB — COMPREHENSIVE METABOLIC PANEL
ALT: 13 U/L (ref 0–35)
AST: 20 U/L (ref 0–37)
Albumin: 2.5 g/dL — ABNORMAL LOW (ref 3.5–5.2)
Alkaline Phosphatase: 63 U/L (ref 39–117)
Anion gap: 12 (ref 5–15)
BILIRUBIN TOTAL: 0.3 mg/dL (ref 0.3–1.2)
BUN: 11 mg/dL (ref 6–23)
CALCIUM: 8.5 mg/dL (ref 8.4–10.5)
CHLORIDE: 103 meq/L (ref 96–112)
CO2: 27 meq/L (ref 19–32)
Creatinine, Ser: 0.56 mg/dL (ref 0.50–1.10)
Glucose, Bld: 91 mg/dL (ref 70–99)
Potassium: 3.6 mEq/L — ABNORMAL LOW (ref 3.7–5.3)
SODIUM: 142 meq/L (ref 137–147)
Total Protein: 7.2 g/dL (ref 6.0–8.3)

## 2014-09-20 LAB — CBC
HCT: 24.4 % — ABNORMAL LOW (ref 36.0–46.0)
HEMOGLOBIN: 7.5 g/dL — AB (ref 12.0–15.0)
MCH: 27.3 pg (ref 26.0–34.0)
MCHC: 30.7 g/dL (ref 30.0–36.0)
MCV: 88.7 fL (ref 78.0–100.0)
Platelets: 598 10*3/uL — ABNORMAL HIGH (ref 150–400)
RBC: 2.75 MIL/uL — ABNORMAL LOW (ref 3.87–5.11)
RDW: 14 % (ref 11.5–15.5)
WBC: 9.4 10*3/uL (ref 4.0–10.5)

## 2014-09-20 MED ORDER — SENNA 8.6 MG PO TABS: 1.0000 | ORAL_TABLET | Freq: Every day | ORAL | Status: DC | PRN

## 2014-09-20 MED ORDER — ENSURE COMPLETE PO LIQD: 237.0000 mL | Freq: Three times a day (TID) | ORAL | Status: DC

## 2014-09-20 MED ORDER — PREDNISONE 20 MG PO TABS: 20.0000 mg | ORAL_TABLET | Freq: Every day | ORAL | Status: DC

## 2014-09-20 MED ORDER — CETYLPYRIDINIUM CHLORIDE 0.05 % MT LIQD
7.0000 mL | Freq: Two times a day (BID) | OROMUCOSAL | Status: DC
  Administered 2014-09-20: 7 mL via OROMUCOSAL

## 2014-09-20 NOTE — Progress Notes (Signed)
Nsg Discharge Note  Admit Date:  09/18/2014 Discharge date: 09/20/2014   Kathy [name] to be D/C'd Home per MD order.  AVS completed.  Copy for chart, and copy for patient signed, and dated. Patient/caregiver able to verbalize understanding.  Discharge Medication:   Medication List    STOP taking these medications        ranitidine 150 MG tablet  Commonly known as:  ZANTAC      TAKE these medications        albuterol 108 (90 BASE) MCG/ACT inhaler  Commonly known as:  PROVENTIL HFA;VENTOLIN HFA  Inhale 1-2 puffs into the lungs every 6 (six) hours as needed for wheezing or shortness of breath.     diclofenac sodium 1 % Gel  Commonly known as:  VOLTAREN  Apply 4 g topically 4 (four) times daily as needed (for joint pain).     folic acid 1 MG tablet  Commonly known as:  FOLVITE  Take 1 mg by mouth daily.     HYDROcodone-acetaminophen 5-325 MG per tablet  Commonly known as:  NORCO/VICODIN  Take 2 tablets by mouth every 4 (four) hours as needed for moderate pain.     hydroxychloroquine 200 MG tablet  Commonly known as:  PLAQUENIL  Take 200 mg by mouth daily.     leflunomide 10 MG tablet  Commonly known as:  ARAVA  Take 10 mg by mouth daily.     MULTIVITAMIN PO  Take 1 tablet by mouth daily.     omeprazole 20 MG capsule  Commonly known as:  PRILOSEC  Take 20 mg by mouth daily.     predniSONE 20 MG tablet  Commonly known as:  DELTASONE  Take 1 tablet (20 mg total) by mouth daily with breakfast.     senna 8.6 MG Tabs tablet  Commonly known as:  SENOKOT  Take 1 tablet (8.6 mg total) by mouth daily as needed for mild constipation.     sulfaSALAzine 500 MG EC tablet  Commonly known as:  AZULFIDINE  Take 1,000 mg by mouth 2 (two) times daily.     tiZANidine 4 MG tablet  Commonly known as:  ZANAFLEX  Take 4-8 mg by mouth every 6 (six) hours as needed for muscle spasms.     traMADol 50 MG tablet  Commonly known as:  ULTRAM  Take 50 mg by mouth every 6 (six)  hours as needed (pain).        Discharge Assessment: Filed Vitals:   09/20/14 0515  BP: 120/70  Pulse: 112  Temp: 98.6 F (37 C)  Resp: 18   Skin clean, dry and intact without evidence of skin break down, no evidence of skin tears noted. IV catheter discontinued intact. Site without signs and symptoms of complications - no redness or edema noted at insertion site, patient denies c/o pain - only slight tenderness at site.  Dressing with slight pressure applied.  D/c Instructions-Education: Discharge instructions given to patient/family with verbalized understanding. D/c education completed with patient/family including follow up instructions, medication list, d/c activities limitations if indicated, with other d/c instructions as indicated by MD - patient able to verbalize understanding, all questions fully answered. Patient instructed to return to ED, call 911, or call MD for any changes in condition.  Patient escorted via WC, and D/C home via private auto.  Kern Reap, RN 09/20/2014 1:56 PM

## 2014-09-20 NOTE — Discharge Summary (Signed)
Physician Discharge Summary  Kathy [name] IRC:[id number] DOB: 1966/06/19 DOA: 09/18/2014  PCP: Dorrene German, MD  Admit date: 09/18/2014 Discharge date: 09/20/2014  Time spent: 25 minutes  Recommendations for Outpatient Follow-up:  1. Needs prolonged steroid taper-would discuss this carefully with Dr. Nickola Major   Discharge Diagnoses:  Principal Problem:   Rheumatoid arthritis flare Active Problems:   Hypokalemia   Loss of weight   Anorexia   Anemia   Malnutrition of moderate degree   Discharge Condition: fair  Diet recommendation: reg  Filed Weights   09/18/14 1326  Weight: 74.435 kg (164 lb 1.6 oz)    History of present illness:  Kathy [name] is a 48 yo female with PMH of Rheumatoid Arthritis that presents with multiple joint pain and swelling for the past week. She is managed by Dr. Zenovia Jordan and was placed on a prednisone taper, that she just completed, then subsequently developed the pain. She also states that she has not been taking her Methotrexate due to vomiting, and has not been taking Arava for a while. The pain and swelling is over the right knee/ankle/foot and has severe pain in her neck shoulders and hands. She states the pain is severe enough that she has been able to ambulate. She was admitted 11/25 and started on a prlonged course of 7 days of Prednisol 20 mg daily.  She felt better and was able to ambulate although had night pains. She also has OA and need comprehensive work-up Her pain she rated as 9/10 on several occasions but was smiling and laughing raising some concern regarding discordance I have advised her that she will need to further be managed by Dr. Nickola Major and that she has maximally benefited from hospital stay We will order home health.  She was given a script for prednisone    Discharge Exam: Filed Vitals:   09/20/14 0515  BP: 120/70  Pulse: 112  Temp: 98.6 F (37 C)  Resp: 18    General: EOMI NCAT Cardiovascular:  s1 s 2no  m/r/g Respiratory:  Clear no added sound  Discharge Instructions You were cared for by a hospitalist during your hospital stay. If you have any questions about your discharge medications or the care you received while you were in the hospital after you are discharged, you can call the unit and asked to speak with the hospitalist on call if the hospitalist that took care of you is not available. Once you are discharged, your primary care physician will handle any further medical issues. Please note that NO REFILLS for any discharge medications will be authorized once you are discharged, as it is imperative that you return to your primary care physician (or establish a relationship with a primary care physician if you do not have one) for your aftercare needs so that they can reassess your need for medications and monitor your lab values.  Discharge Instructions    Diet - low sodium heart healthy    Complete by:  As directed      Discharge instructions    Complete by:  As directed   Complete 7 days prednisone 20 mg See Dr. Nickola Major soon in the outpatient setting We will order home health for you FOR pain that is not controlled at night, take [phone] mg of tylenol at night-you can use this short term     Increase activity slowly    Complete by:  As directed           Current Discharge Medication List  START taking these medications   Details  senna (SENOKOT) 8.6 MG TABS tablet Take 1 tablet (8.6 mg total) by mouth daily as needed for mild constipation. Qty: 120 each, Refills: 0      CONTINUE these medications which have CHANGED   Details  predniSONE (DELTASONE) 20 MG tablet Take 1 tablet (20 mg total) by mouth daily with breakfast. Qty: 7 tablet, Refills: 0      CONTINUE these medications which have NOT CHANGED   Details  albuterol (PROVENTIL HFA;VENTOLIN HFA) 108 (90 BASE) MCG/ACT inhaler Inhale 1-2 puffs into the lungs every 6 (six) hours as needed for wheezing or shortness of  breath.    diclofenac sodium (VOLTAREN) 1 % GEL Apply 4 g topically 4 (four) times daily as needed (for joint pain).    folic acid (FOLVITE) 1 MG tablet Take 1 mg by mouth daily.    HYDROcodone-acetaminophen (NORCO/VICODIN) 5-325 MG per tablet Take 2 tablets by mouth every 4 (four) hours as needed for moderate pain. Qty: 20 tablet, Refills: 0    hydroxychloroquine (PLAQUENIL) 200 MG tablet Take 200 mg by mouth daily.    leflunomide (ARAVA) 10 MG tablet Take 10 mg by mouth daily.    Multiple Vitamins-Minerals (MULTIVITAMIN PO) Take 1 tablet by mouth daily.    omeprazole (PRILOSEC) 20 MG capsule Take 20 mg by mouth daily.    sulfaSALAzine (AZULFIDINE) 500 MG EC tablet Take 1,000 mg by mouth 2 (two) times daily.    tiZANidine (ZANAFLEX) 4 MG tablet Take 4-8 mg by mouth every 6 (six) hours as needed for muscle spasms.    traMADol (ULTRAM) 50 MG tablet Take 50 mg by mouth every 6 (six) hours as needed (pain).      STOP taking these medications     ranitidine (ZANTAC) 150 MG tablet        Allergies  Allergen Reactions  . Methotrexate Derivatives Shortness Of Breath and Nausea And Vomiting  . Aspirin Nausea And Vomiting      The results of significant diagnostics from this hospitalization (including imaging, microbiology, ancillary and laboratory) are listed below for reference.    Significant Diagnostic Studies: No results found.  Microbiology: No results found for this or any previous visit (from the past 240 hour(s)).   Labs: Basic Metabolic Panel:  Recent Labs Lab 09/18/14 0922 09/18/14 1607 09/19/14 0535 09/20/14 0415  NA 140  --  140 142  K 2.9*  --  3.1* 3.6*  CL 96  --  100 103  CO2 28  --  29 27  GLUCOSE 99  --  90 91  BUN 11  --  11 11  CREATININE 0.52 0.49* 0.58 0.56  CALCIUM 9.1  --  8.8 8.5  MG  --   --  1.9  --    Liver Function Tests:  Recent Labs Lab 09/20/14 0415  AST 20  ALT 13  ALKPHOS 63  BILITOT 0.3  PROT 7.2  ALBUMIN 2.5*    No results for input(s): LIPASE, AMYLASE in the last 168 hours. No results for input(s): AMMONIA in the last 168 hours. CBC:  Recent Labs Lab 09/18/14 0922 09/18/14 1607 09/19/14 0535 09/20/14 0415  WBC 8.6 7.2 7.8 9.4  NEUTROABS 6.0  --   --   --   HGB 8.0* 8.0* 7.3* 7.5*  HCT 24.8* 25.5* 23.4* 24.4*  MCV 85.5 85.9 87.0 88.7  PLT 518* 558* 577* 598*   Cardiac Enzymes: No results for input(s): CKTOTAL, CKMB, CKMBINDEX, TROPONINI  in the last 168 hours. BNP: BNP (last 3 results) No results for input(s): PROBNP in the last 8760 hours. CBG: No results for input(s): GLUCAP in the last 168 hours.     SignedRhetta Mura  [facility] 09/20/2014, 12:04 PM

## 2014-09-20 NOTE — Plan of Care (Signed)
Problem: Consults Goal: General Medical Patient Education See Patient Education Module for specific education.  Outcome: Completed/Met Date Met:  09/20/14 Goal: Skin Care Protocol Initiated - if Braden Score 18 or less If consults are not indicated, leave blank or document N/A  Outcome: Not Applicable Date Met:  17/47/15 Goal: Nutrition Consult-if indicated Outcome: Not Applicable Date Met:  95/39/67 Goal: Diabetes Guidelines if Diabetic/Glucose > 140 If diabetic or lab glucose is > 140 mg/dl - Initiate Diabetes/Hyperglycemia Guidelines & Document Interventions  Outcome: Not Applicable Date Met:  28/97/91  Problem: Phase II Progression Outcomes Goal: Progress activity as tolerated unless otherwise ordered Outcome: Progressing Goal: Vital signs remain stable Outcome: Completed/Met Date Met:  09/20/14 Goal: Obtain order to discontinue catheter if appropriate Outcome: Not Applicable Date Met:  50/41/36  Problem: Phase III Progression Outcomes Goal: Pain controlled on oral analgesia Outcome: Progressing  Problem: Discharge Progression Outcomes Goal: Pain controlled with appropriate interventions Outcome: Progressing Goal: Hemodynamically stable Outcome: Completed/Met Date Met:  09/20/14 Goal: Tolerating diet Outcome: Completed/Met Date Met:  09/20/14

## 2014-10-12 ENCOUNTER — Emergency Department (HOSPITAL_COMMUNITY): Payer: Medicaid Other

## 2014-10-12 ENCOUNTER — Encounter (HOSPITAL_COMMUNITY): Payer: Self-pay

## 2014-10-12 ENCOUNTER — Emergency Department (HOSPITAL_COMMUNITY)
Admission: EM | Admit: 2014-10-12 | Discharge: 2014-10-12 | Disposition: A | Discharge status: Home / Self Care | Payer: Medicaid Other | Attending: Emergency Medicine | Admitting: Emergency Medicine

## 2014-10-12 DIAGNOSIS — Z792 Long term (current) use of antibiotics: Secondary | ICD-10-CM | POA: Diagnosis not present

## 2014-10-12 DIAGNOSIS — M542 Cervicalgia: Secondary | ICD-10-CM | POA: Diagnosis not present

## 2014-10-12 DIAGNOSIS — Z8679 Personal history of other diseases of the circulatory system: Secondary | ICD-10-CM | POA: Insufficient documentation

## 2014-10-12 DIAGNOSIS — M19012 Primary osteoarthritis, left shoulder: Secondary | ICD-10-CM | POA: Diagnosis not present

## 2014-10-12 DIAGNOSIS — M19019 Primary osteoarthritis, unspecified shoulder: Secondary | ICD-10-CM

## 2014-10-12 DIAGNOSIS — R42 Dizziness and giddiness: Secondary | ICD-10-CM | POA: Insufficient documentation

## 2014-10-12 DIAGNOSIS — Z79899 Other long term (current) drug therapy: Secondary | ICD-10-CM | POA: Insufficient documentation

## 2014-10-12 DIAGNOSIS — M25512 Pain in left shoulder: Secondary | ICD-10-CM

## 2014-10-12 DIAGNOSIS — Z7952 Long term (current) use of systemic steroids: Secondary | ICD-10-CM | POA: Insufficient documentation

## 2014-10-12 MED ORDER — HYDROCODONE-ACETAMINOPHEN 5-325 MG PO TABS
1.0000 | ORAL_TABLET | Freq: Once | ORAL | Status: AC
  Administered 2014-10-12: 1 via ORAL
  Filled 2014-10-12: qty 1

## 2014-10-12 NOTE — ED Provider Notes (Addendum)
CSN: [id number]     Arrival date & time 10/12/14  0236 History  This chart was scribed for Derwood Kaplan, MD by Freida Busman, ED Scribe. This patient was seen in room B16C/B16C and the patient's care was started 3:54 AM.      Chief Complaint  Patient presents with  . Shoulder Pain  . Dizziness      The history is provided by the patient. No language interpreter was used.     HPI Comments:  Kathy [name] is a 48 y.o. female with a h/o RA who presents to the Emergency Department complaining of  burning pain to her left neck that woke her from her sleep this am. She also reports similar pain to her left shoulder. Pt has a h/o similar pain that she describes as throbbing in nature due to RA; notes burning sensation today is new. She denies numbness/tingling of her extremities at this time. She also denies bowel/bladder incontinence, and change in coordination/balance. No alleviating factors noted.   Past Medical History  Diagnosis Date  . Rheumatoid arteritis    History reviewed. No pertinent past surgical history. History reviewed. No pertinent family history. History  Substance Use Topics  . Smoking status: Never Smoker   . Smokeless tobacco: Not on file  . Alcohol Use: No   OB History    No data available     Review of Systems  Musculoskeletal: Positive for myalgias and neck pain.  Neurological: Negative for weakness and numbness.  All other systems reviewed and are negative.     Allergies  Methotrexate derivatives and Aspirin  Home Medications   Prior to Admission medications   Medication Sig Start Date End Date Taking? Authorizing Provider  albuterol (PROVENTIL HFA;VENTOLIN HFA) 108 (90 BASE) MCG/ACT inhaler Inhale 1-2 puffs into the lungs every 6 (six) hours as needed for wheezing or shortness of breath.   Yes Historical Provider, MD  azithromycin (ZITHROMAX) 250 MG tablet Take 250 mg by mouth daily.   Yes Historical Provider, MD  folic acid (FOLVITE) 1 MG  tablet Take 1 mg by mouth daily.   Yes Historical Provider, MD  hydroxychloroquine (PLAQUENIL) 200 MG tablet Take 200 mg by mouth daily.   Yes Historical Provider, MD  Multiple Vitamins-Minerals (MULTIVITAMIN PO) Take 1 tablet by mouth daily.   Yes Historical Provider, MD  omeprazole (PRILOSEC) 20 MG capsule Take 20 mg by mouth daily.   Yes Historical Provider, MD  predniSONE (DELTASONE) 20 MG tablet Take 1 tablet (20 mg total) by mouth daily with breakfast. 09/20/14  Yes Rhetta Mura, MD  ranitidine (ZANTAC) 150 MG tablet Take 150 mg by mouth 2 (two) times daily.   Yes Historical Provider, MD  sulfaSALAzine (AZULFIDINE) 500 MG EC tablet Take 1,000 mg by mouth 2 (two) times daily.   Yes Historical Provider, MD  tiZANidine (ZANAFLEX) 4 MG tablet Take 4-8 mg by mouth every 6 (six) hours as needed for muscle spasms.   Yes Historical Provider, MD  traMADol (ULTRAM) 50 MG tablet Take 50 mg by mouth every 6 (six) hours as needed (pain).   Yes Historical Provider, MD  diclofenac sodium (VOLTAREN) 1 % GEL Apply 4 g topically 4 (four) times daily as needed (for joint pain).    Historical Provider, MD  HYDROcodone-acetaminophen (NORCO/VICODIN) 5-325 MG per tablet Take 2 tablets by mouth every 4 (four) hours as needed for moderate pain. Patient not taking: Reported on 10/12/2014 07/11/14   Gilda Crease, MD  leflunomide (ARAVA) 10  MG tablet Take 10 mg by mouth daily.    Historical Provider, MD  senna (SENOKOT) 8.6 MG TABS tablet Take 1 tablet (8.6 mg total) by mouth daily as needed for mild constipation. Patient not taking: Reported on 10/12/2014 09/20/14   Rhetta Mura, MD   BP 138/83 mmHg  Pulse 92  Temp(Src) 98.3 F (36.8 C) (Oral)  Resp 20  SpO2 96% Physical Exam  Constitutional: She is oriented to person, place, and time. She appears well-developed and well-nourished. No distress.  HENT:  Head: Normocephalic and atraumatic.  Eyes: Conjunctivae are normal.  Neck:  No  meningeal signs  Cardiovascular: Normal rate.   Pulmonary/Chest: Effort normal. She exhibits tenderness.  Anterior chest tenderness over left breast TTP over distal 1/3 of left clavicle.   Abdominal: She exhibits no distension.  Musculoskeletal:  No step offs noted to cervical spine  Lower cervical spine TTP No paraspinal tenderness.    Neurological: She is alert and oriented to person, place, and time.  BUE 4/5 and equal grip strength equal Normal BUE and BLE sensory exam.  Skin: Skin is warm and dry.  Psychiatric: She has a normal mood and affect.  Nursing note and vitals reviewed.   ED Course  Procedures   DIAGNOSTIC STUDIES:  Oxygen Saturation is 97% on RA, normal by my interpretation.    COORDINATION OF CARE:  4:04 AM Discussed treatment plan with pt at bedside and pt agreed to plan.  Labs Review Labs Reviewed - No data to display  Imaging Review Dg Clavicle Left  10/12/2014   CLINICAL DATA:  No injury, burning LEFT neck pain disturbing patient's sleep.  EXAM: LEFT CLAVICLE - 2+ VIEWS  COMPARISON:  None.  FINDINGS: Mild acromioclavicular osteoarthrosis and undersurface spurring. No fracture deformity. No dislocation. No destructive bony lesions. Soft tissue planes are nonsuspicious.  IMPRESSION: Mild acromioclavicular osteoarthrosis without acute osseous process.   Electronically Signed   By: Awilda Metro   On: 10/12/2014 04:54     EKG Interpretation   Date/Time:  Friday October 12 2014 02:44:48 EST Ventricular Rate:  92 PR Interval:  164 QRS Duration: 87 QT Interval:  375 QTC Calculation: 464 R Axis:   41 Text Interpretation:  Sinus rhythm Consider left ventricular hypertrophy  Baseline wander in lead(s) V6 No acute findings Confirmed by Rhunette Croft, MD,  Janey Genta ([id number]) on 10/12/2014 4:02:26 AM      MDM   Final diagnoses:  AC joint pain, left  Shoulder arthritis    I personally performed the services described in this documentation, which was  scribed in my presence. The recorded information has been reviewed and is accurate.  Pt comes in with cc of shoulder pain. Bilateral initially, but currently, L shoulder hurts the most . There is also neck pain. No neuro deficits. The shoulder pain is burning in nature - atypical  - and pt has tenderness over the distal clavicle and over the [facility] joint. ROM is normal - no concerns for infection. Has taken steroids long term, and Xrays show OA. Possible due to meds or from the RA.  Pt wants to get orthopedics information for optimal care, info provided.     Derwood Kaplan, MD 10/12/14 [id number]  Derwood Kaplan, MD 10/12/14 [phone]

## 2014-10-12 NOTE — ED Notes (Signed)
Pt from home with c/o bilateral shoulder pain that started about 15-20 minutes PTA.  Pt sts that pain awoke her from her sleep.  Pt with hx of GERD and RA but sts that this pain is different in nature; sts it is "burning".  Reports some numbness earlier today to her left hand but has resolved.  No other neuro deficits reported.  Sts when she awoke she was also dizzy but was ambulatory on arrival with a steady gait.

## 2014-10-12 NOTE — Discharge Instructions (Signed)
You have arthritis of the shoulder joint. This could be due to your Rheumatoid and the medication side effect.  Please see the Orthopedic doctor as needed.   Shoulder Range of Motion Exercises The shoulder is the most flexible joint in the human body. Because of this it is also the most unstable joint in the body. All ages can develop shoulder problems. Early treatment of problems is necessary for a good outcome. People react to shoulder pain by decreasing the movement of the joint. After a brief period of time, the shoulder can become "frozen". This is an almost complete loss of the ability to move the damaged shoulder. Following injuries your caregivers can give you instructions on exercises to keep your range of motion (ability to move your shoulder freely), or regain it if it has been lost.  EXERCISES EXERCISES TO MAINTAIN THE MOBILITY OF YOUR SHOULDER: Codman's Exercise or Pendulum Exercise  This exercise may be performed in a prone (face-down) lying position or standing while leaning on a chair with the opposite arm. Its purpose is to relax the muscles in your shoulder and slowly but surely increase the range of motion and to relieve pain.  Lie on your stomach close to the side edge of the bed. Let your weak arm hang over the edge of the bed. Relax your shoulder, arm and hand. Let your shoulder blade relax and drop down.  Slowly and gently swing your arm forward and back. Do not use your neck muscles; relax them. It might be easier to have someone else gently start swinging your arm.  As pain decreases, increase your swing. To start, arm swing should begin at 15 degree angles. In time and as pain lessens, move to 30-45 degree angles. Start with swinging for about 15 seconds, and work towards swinging for 3 to 5 minutes.  This exercise may also be performed in a standing/bent over position.  Stand and hold onto a sturdy chair with your good arm. Bend forward at the waist and bend your  knees slightly to help protect your back. Relax your weak arm, let it hang limp. Relax your shoulder blade and let it drop.  Keep your shoulder relaxed and use body motion to swing your arm in small circles.  Stand up tall and relax.  Repeat motion and change direction of circles.  Start with swinging for about 30 seconds, and work towards swinging for 3 to 5 minutes. STRETCHING EXERCISES:  Lift your arm out in front of you with the elbow bent at 90 degrees. Using your other arm gently pull the elbow forward and across your body.  Bend one arm behind you with the palm facing outward. Using the other arm, hold a towel or rope and reach this arm up above your head, then bend it at the elbow to move your wrist to behind your neck. Grab the free end of the towel with the hand behind your back. Gently pull the towel up with the hand behind your neck, gradually increasing the pull on the hand behind the small of your back. Then, gradually pull down with the hand behind the small of your back. This will pull the hand and arm behind your neck further. Both shoulders will have an increased range of motion with repetition of this exercise. STRENGTHENING EXERCISES:  Standing with your arm at your side and straight out from your shoulder with the elbow bent at 90 degrees, hold onto a small weight and slowly raise your hand so  it points straight up in the air. Repeat this five times to begin with, and gradually increase to ten times. Do this four times per day. As you grow stronger you can gradually increase the weight.  Repeat the above exercise, only this time using an elastic band. Start with your hand up in the air and pull down until your hand is by your side. As you grow stronger, gradually increase the amount you pull by increasing the number or size of the elastic bands. Use the same amount of repetitions.  Standing with your hand at your side and holding onto a weight, gradually lift the hand in  front of you until it is over your head. Do the same also with the hand remaining at your side and lift the hand away from your body until it is again over your head. Repeat this five times to begin with, and gradually increase to ten times. Do this four times per day. As you grow stronger you can gradually increase the weight. Document Released: 07/11/2003 Document Revised: 10/17/2013 Document Reviewed: 10/12/2005 [facility] Patient Information 2015 [place], [place]. This information is not intended to replace advice given to you by your health care provider. Make sure you discuss any questions you have with your health care provider.  Arthralgia Your caregiver has diagnosed you as suffering from an arthralgia. Arthralgia means there is pain in a joint. This can come from many reasons including:  Bruising the joint which causes soreness (inflammation) in the joint.  Wear and tear on the joints which occur as we grow older (osteoarthritis).  Overusing the joint.  Various forms of arthritis.  Infections of the joint. Regardless of the cause of pain in your joint, most of these different pains respond to anti-inflammatory drugs and rest. The exception to this is when a joint is infected, and these cases are treated with antibiotics, if it is a bacterial infection. HOME CARE INSTRUCTIONS   Rest the injured area for as long as directed by your caregiver. Then slowly start using the joint as directed by your caregiver and as the pain allows. Crutches as directed may be useful if the ankles, knees or hips are involved. If the knee was splinted or casted, continue use and care as directed. If an stretchy or elastic wrapping bandage has been applied today, it should be removed and re-applied every 3 to 4 hours. It should not be applied tightly, but firmly enough to keep swelling down. Watch toes and feet for swelling, bluish discoloration, coldness, numbness or excessive pain. If any of these problems  (symptoms) occur, remove the ace bandage and re-apply more loosely. If these symptoms persist, contact your caregiver or return to this location.  For the first 24 hours, keep the injured extremity elevated on pillows while lying down.  Apply ice for 15-20 minutes to the sore joint every couple hours while awake for the first half day. Then 03-04 times per day for the first 48 hours. Put the ice in a plastic bag and place a towel between the bag of ice and your skin.  Wear any splinting, casting, elastic bandage applications, or slings as instructed.  Only take over-the-counter or prescription medicines for pain, discomfort, or fever as directed by your caregiver. Do not use aspirin immediately after the injury unless instructed by your physician. Aspirin can cause increased bleeding and bruising of the tissues.  If you were given crutches, continue to use them as instructed and do not resume weight bearing on  the sore joint until instructed. Persistent pain and inability to use the sore joint as directed for more than 2 to 3 days are warning signs indicating that you should see a caregiver for a follow-up visit as soon as possible. Initially, a hairline fracture (break in bone) may not be evident on X-rays. Persistent pain and swelling indicate that further evaluation, non-weight bearing or use of the joint (use of crutches or slings as instructed), or further X-rays are indicated. X-rays may sometimes not show a small fracture until a week or 10 days later. Make a follow-up appointment with your own caregiver or one to whom we have referred you. A radiologist (specialist in reading X-rays) may read your X-rays. Make sure you know how you are to obtain your X-ray results. Do not assume everything is normal if you do not hear from [place]. SEEK MEDICAL CARE IF: Bruising, swelling, or pain increases. SEEK IMMEDIATE MEDICAL CARE IF:   Your fingers or toes are numb or blue.  The pain is not responding to  medications and continues to stay the same or get worse.  The pain in your joint becomes severe.  You develop a fever over 102 F (38.9 C).  It becomes impossible to move or use the joint. MAKE SURE YOU:   Understand these instructions.  Will watch your condition.  Will get help right away if you are not doing well or get worse. Document Released: 10/12/2005 Document Revised: 01/04/2012 Document Reviewed: 05/30/2008 [facility] Patient Information 2015 [place], [place]. This information is not intended to replace advice given to you by your health care provider. Make sure you discuss any questions you have with your health care provider.

## 2014-10-12 NOTE — ED Notes (Signed)
To X-ray

## 2014-10-12 NOTE — ED Notes (Signed)
Patient transported to X-ray 

## 2014-10-12 NOTE — ED Notes (Signed)
Returned from xray

## 2015-03-15 ENCOUNTER — Ambulatory Visit (INDEPENDENT_AMBULATORY_CARE_PROVIDER_SITE_OTHER): Payer: Medicaid Other | Admitting: Family Medicine

## 2015-03-15 ENCOUNTER — Encounter: Payer: Self-pay | Admitting: Family Medicine

## 2015-03-15 VITALS — BP 114/85 | HR 134 | Temp 98.3°F | Ht 61.0 in | Wt 155.7 lb

## 2015-03-15 DIAGNOSIS — Z7189 Other specified counseling: Secondary | ICD-10-CM | POA: Diagnosis not present

## 2015-03-15 DIAGNOSIS — M1990 Unspecified osteoarthritis, unspecified site: Secondary | ICD-10-CM

## 2015-03-15 DIAGNOSIS — R202 Paresthesia of skin: Secondary | ICD-10-CM | POA: Diagnosis not present

## 2015-03-15 DIAGNOSIS — M19019 Primary osteoarthritis, unspecified shoulder: Secondary | ICD-10-CM | POA: Insufficient documentation

## 2015-03-15 DIAGNOSIS — Z7689 Persons encountering health services in other specified circumstances: Secondary | ICD-10-CM

## 2015-03-15 MED ORDER — VALACYCLOVIR HCL 1 G PO TABS: 1000.0000 mg | ORAL_TABLET | Freq: Three times a day (TID) | ORAL | Status: DC

## 2015-03-15 NOTE — Patient Instructions (Signed)
I have referred her to orthopedics for the clavicle problem and pins and needles sensation  Your face tingling is most likely due to shingles- we will go ahead and treat like it is the shingles even though there is no rash Continue to monitor for the rash- return if you notice it. If you get fevers, chills, severe headaches, or change in vision, please seek medical attention immediately. If you noticed weakness or numbness, facial droop, or slurred vision, please seek medical attention immediately. Please follow up with me in 2 weeks.  Shingles Shingles (herpes zoster) is an infection that is caused by the same virus that causes chickenpox (varicella). The infection causes a painful skin rash and fluid-filled blisters, which eventually break open, crust over, and heal. It may occur in any area of the body, but it usually affects only one side of the body or face. The pain of shingles usually lasts about 1 month. However, some people with shingles may develop long-term (chronic) pain in the affected area of the body. Shingles often occurs many years after the person had chickenpox. It is more common:  In people older than 50 years.  In people with weakened immune systems, such as those with HIV, AIDS, or cancer.  In people taking medicines that weaken the immune system, such as transplant medicines.  In people under great stress. CAUSES  Shingles is caused by the varicella zoster virus (VZV), which also causes chickenpox. After a person is infected with the virus, it can remain in the person's body for years in an inactive state (dormant). To cause shingles, the virus reactivates and breaks out as an infection in a nerve root. The virus can be spread from person to person (contagious) through contact with open blisters of the shingles rash. It will only spread to people who have not had chickenpox. When these people are exposed to the virus, they may develop chickenpox. They will not develop  shingles. Once the blisters scab over, the person is no longer contagious and cannot spread the virus to others. SIGNS AND SYMPTOMS  Shingles shows up in stages. The initial symptoms may be pain, itching, and tingling in an area of the skin. This pain is usually described as burning, stabbing, or throbbing.In a few days or weeks, a painful red rash will appear in the area where the pain, itching, and tingling were felt. The rash is usually on one side of the body in a band or belt-like pattern. Then, the rash usually turns into fluid-filled blisters. They will scab over and dry up in approximately 2-3 weeks. Flu-like symptoms may also occur with the initial symptoms, the rash, or the blisters. These may include:  Fever.  Chills.  Headache.  Upset stomach. DIAGNOSIS  Your health care provider will perform a skin exam to diagnose shingles. Skin scrapings or fluid samples may also be taken from the blisters. This sample will be examined under a microscope or sent to a lab for further testing. TREATMENT  There is no specific cure for shingles. Your health care provider will likely prescribe medicines to help you manage the pain, recover faster, and avoid long-term problems. This may include antiviral drugs, anti-inflammatory drugs, and pain medicines. HOME CARE INSTRUCTIONS   Take a cool bath or apply cool compresses to the area of the rash or blisters as directed. This may help with the pain and itching.   Take medicines only as directed by your health care provider.   Rest as directed by your  health care provider.  Keep your rash and blisters clean with mild soap and cool water or as directed by your health care provider.  Do not pick your blisters or scratch your rash. Apply an anti-itch cream or numbing creams to the affected area as directed by your health care provider.  Keep your shingles rash covered with a loose bandage (dressing).  Avoid skin contact with:  Babies.    Pregnant women.   Children with eczema.   Elderly people with transplants.   People with chronic illnesses, such as leukemia or AIDS.   Wear loose-fitting clothing to help ease the pain of material rubbing against the rash.  Keep all follow-up visits as directed by your health care provider.If the area involved is on your face, you may receive a referral for a specialist, such as an eye doctor (ophthalmologist) or an ear, nose, and throat (ENT) doctor. Keeping all follow-up visits will help you avoid eye problems, chronic pain, or disability.  SEEK IMMEDIATE MEDICAL CARE IF:   You have facial pain, pain around the eye area, or loss of feeling on one side of your face.  You have ear pain or ringing in your ear.  You have loss of taste.  Your pain is not relieved with prescribed medicines.   Your redness or swelling spreads.   You have more pain and swelling.  Your condition is worsening or has changed.   You have a fever. MAKE SURE YOU:  Understand these instructions.  Will watch your condition.  Will get help right away if you are not doing well or get worse. Document Released: 10/12/2005 Document Revised: 02/26/2014 Document Reviewed: 05/26/2012 [facility] Patient Information 2015 [place], [place]. This information is not intended to replace advice given to you by your health care provider. Make sure you discuss any questions you have with your health care provider.

## 2015-03-15 NOTE — Progress Notes (Signed)
Patient ID: Kathy [name], female   DOB: Jul 01, 1966, 49 y.o.   MRN: [id number]    CC: establish care HPI: Patient is a 49 y.o. female presenting to clinic today to establish care  Previous PCP: Dr. Concepcion Elk Rheumatologist: Dr. Zenovia Jordan  Will obtain records   Acute Concerns: Tingling on R side of face x 1.5 weeks. She initially noticed it while laughing and smiling. She stopped drinking sweet tea and the tingling has lightened up over she still notices it at night.  Occasionally has headaches but does not seem to correlate with tingling. No blurred vision. No fevers or chills. No rash noted.  Also noted pins/needles sensation over the left anterior chest wall x 5 days. The tingling is intermittent in nature. No trigger. No h/o injury. She has been to the emergency room due to a burning sensation over the same area. At that time she was noted to have mild before meals osteoarthritis. She states she was switched to go see an orthopedist at that time, however has not gone. There is no radiation of the pins and needle sensation, no diaphoresis, no chest pressure, no difficulty breathing, no shortness of breath  Sexual/Birth History: c-section 1988.  One miscarriage Hysterectomy in 2005   Past Medical History Patient Active Problem List   Diagnosis Date Noted  . 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 Tingling 03/15/2015  . Malnutrition of moderate degree 09/20/2014  . Rheumatoid arthritis flare 09/18/2014  . Hypokalemia 09/18/2014  . Loss of weight 09/18/2014  . Anorexia 09/18/2014  . Anemia 09/18/2014  . TROCHANTERIC BURSITIS, RIGHT 05/20/2010  . VITAMIN D DEFICIENCY 02/16/2010  . COUGH 02/04/2010  . RHEUMATOID ARTHRITIS 09/15/2009  . DEGENERATIVE JOINT DISEASE, GENERALIZED 07/28/2007  s/p TAH 04/18/2004  Medications- reviewed and updated Current Outpatient Prescriptions  Medication Sig Dispense Refill  . methotrexate (RHEUMATREX) 2.5 MG tablet Take 2.5 mg  by mouth once a week. Caution:Chemotherapy. Protect from light.    . predniSONE (DELTASONE) 5 MG tablet Take 5 mg by mouth daily with breakfast.    . sulfaSALAzine (AZULFIDINE) 500 MG EC tablet Take 1,000 mg by mouth 2 (two) times daily.    Marland Kitchen albuterol (PROVENTIL HFA;VENTOLIN HFA) 108 (90 BASE) MCG/ACT inhaler Inhale 1-2 puffs into the lungs every 6 (six) hours as needed for wheezing or shortness of breath.    . diclofenac sodium (VOLTAREN) 1 % GEL Apply 4 g topically 4 (four) times daily as needed (for joint pain).    . folic acid (FOLVITE) 1 MG tablet Take 1 mg by mouth daily.    . hydroxychloroquine (PLAQUENIL) 200 MG tablet Take 200 mg by mouth daily.    . Multiple Vitamins-Minerals (MULTIVITAMIN PO) Take 1 tablet by mouth daily.    Marland Kitchen omeprazole (PRILOSEC) 20 MG capsule Take 20 mg by mouth daily.    . ranitidine (ZANTAC) 150 MG tablet Take 150 mg by mouth 2 (two) times daily.    Marland Kitchen senna (SENOKOT) 8.6 MG TABS tablet Take 1 tablet (8.6 mg total) by mouth daily as needed for mild constipation. (Patient not taking: Reported on 10/12/2014) 120 each 0  . tiZANidine (ZANAFLEX) 4 MG tablet Take 4-8 mg by mouth every 6 (six) hours as needed for muscle spasms.    . traMADol (ULTRAM) 50 MG tablet Take 50 mg by mouth every 6 (six) hours as needed (pain).    . valACYclovir (VALTREX) 1000 MG tablet Take 1 tablet (1,000 mg total) by mouth 3 (three) times daily. 30 tablet  0   No current facility-administered medications for this visit.   Cancer Screening:  Pap Smear: TAH on 04/18/2004 (no cervix)  Mammogram:  08/13/2014-negative   Colonoscopy: N/A  Dexa: N/A   Social:  History   Social History  . Marital Status: Single    Spouse Name: N/A  . Number of Children: N/A  . Years of Education: N/A   Social History Main Topics  . Smoking status: Never Smoker   . Smokeless tobacco: Not on file  . Alcohol Use: No  . Drug Use: No  . Sexual Activity: Not Currently   Other Topics Concern  . None    Social History Narrative  Denies alcohol or drug use  Family History  Problem Relation Age of Onset  . Asthma Mother   . Diabetes Mother   . Hypertension Mother   . Heart disease Mother   . Asthma Father   . Cancer Father     lung  . Cancer Sister     breast at 20  . Cancer Brother     unknown     ROS: All other systems reviewed and are negative.  Office vital signs reviewed. BP 114/85 mmHg  Pulse 134  Temp(Src) 98.3 F (36.8 C) (Oral)  Ht 5\' 1"  (1.549 m)  Wt 155 lb 11.2 oz (70.625 kg)  BMI 29.43 kg/m2   Physical Examination:  General: Awake, alert, well- nourished, NAD ENMT:  TMs intact, normal light reflex, no erythema, no bulging. Nasal turbinates moist. MMM, Oropharynx clear without erythema or tonsillar exudate/hypertrophy Eyes: Conjunctiva non-injected. PERRL.  Cardio: Tachycardic in 110s, regular rhythm, no m/r/g noted. 2+ DP and radial pulses bilaterally Pulm: No increased WOB.  CTAB, without wheezes, rhonchi or crackles noted.  GI: soft, NT/ND,+BS x4, no hepatomegaly, no splenomegaly GU: deffered MSK: Difficulty walking in R leg brace. Bony prominences of the MCP on the 1st and 3rd digits b/l with effusions of the PIP on the 2nd digit b/l. Minimal tenderness, no warmth or redness to touch. No obvious deformity at the left [facility] joint. Worsening "pins and needles" sensation with palpation over the [place] joint.  Skin: dry, intact, no rashes or lesions (speficically none noted over the chest or the face). Neuro: Strength and sensation grossly intact (however difficult to test on the R LE due to brace), DTRs 2/4. Facial movement symmetric. EOMI.   Assessment/Plan: Osteoarthritis of acromioclavicular joint Patient presenting with a h/o a burning sensation around the clavicle found to have mild OA on imaging from the ED.  Today she initially presents with the complaint of L sided chest pain (characterized as pins/needles) x 5 days.  Most likely 2/2 OA given that pain is  worsened with palpation.  This is less likely cardiac in nature as it is reproducible, located more laterally,  not associated with diaphoresis, does not radiate, without chest pressure or SOB.  - Pt would like to be referred back to orthopedics - Patient to continue to monitor symptoms.  - RTC precautions discussed: change in CP, diaphoresis, SOB, difficulty breathing, chest pressure, new onset numbness/tingling or weakness     Tingling Tingling over R lateral aspect of her face (mostly in the maxillary region in the distribution of V2).  Patient initially concerned about stroke, however no focal deficits noted on exam. Not associated with headache or tenderness to palpation or visual change therefore less likely temporal arteritis.  While it is intermittent, this is not the intense, sharp, shock like pain I'd expect to see  with trigeminal neuralgia. This could be an atypical migraine.  No h/o rash and no rashes noted on exam, however given the patient is on immunosuppressants and does not recall getting the herpes zoster vaccine (has had varicella), she is at risk for shingles. While is typically out of the window for tx with Valtrex, given the location (over the face), I discussed the risks and benefits of treating empirically vs observation. If this is shingles, hopefully the initiation will decrease the chance of post-herpetic neuralgias. No pain with EOM or vision changes currently to suggest further complication. - Valtrex 1g TID x 10 days  - If rash arises, pt to return to clinic  - Pt given strict precautions to seek care if: she develops vision changes, she develops pain with eye movements, she develops new weakness/change in sensation. Pt voiced understanding - RTC in 2 weeks for close follow up.    Orders Placed This Encounter  Procedures  . Ambulatory referral to Orthopedic Surgery    Referral Priority:  Routine    Referral Type:  Surgical    Referral Reason:  Specialty Services  Required    Requested Specialty:  Orthopedic Surgery    Number of Visits Requested:  1    Meds ordered this encounter  Medications  . valACYclovir (VALTREX) 1000 MG tablet    Sig: Take 1 tablet (1,000 mg total) by mouth 3 (three) times daily.    Dispense:  30 tablet    Refill:  0  . predniSONE (DELTASONE) 5 MG tablet    Sig: Take 5 mg by mouth daily with breakfast.  . methotrexate (RHEUMATREX) 2.5 MG tablet    Sig: Take 2.5 mg by mouth once a week. Caution:Chemotherapy. Protect from light.    Joanna Puff PGY-1, [facility]

## 2015-03-15 NOTE — Assessment & Plan Note (Signed)
Patient presenting with a h/o a burning sensation around the clavicle found to have mild OA on imaging from the ED.  Today she initially presents with the complaint of L sided chest pain (characterized as pins/needles) x 5 days.  Most likely 2/2 OA given that pain is worsened with palpation.  This is less likely cardiac in nature as it is reproducible, located more laterally,  not associated with diaphoresis, does not radiate, without chest pressure or SOB.  - Pt would like to be referred back to orthopedics - Patient to continue to monitor symptoms.  - RTC precautions discussed: change in CP, diaphoresis, SOB, difficulty breathing, chest pressure, new onset numbness/tingling or weakness

## 2015-03-15 NOTE — Progress Notes (Signed)
I was available as preceptor to resident for this patient's office visit.  

## 2015-03-15 NOTE — Assessment & Plan Note (Signed)
Tingling over R lateral aspect of her face (mostly in the maxillary region in the distribution of V2).  Patient initially concerned about stroke, however no focal deficits noted on exam. Not associated with headache or tenderness to palpation or visual change therefore less likely temporal arteritis.  While it is intermittent, this is not the intense, sharp, shock like pain I'd expect to see with trigeminal neuralgia. This could be an atypical migraine.  No h/o rash and no rashes noted on exam, however given the patient is on immunosuppressants and does not recall getting the herpes zoster vaccine (has had varicella), she is at risk for shingles. While is typically out of the window for tx with Valtrex, given the location (over the face), I discussed the risks and benefits of treating empirically vs observation. If this is shingles, hopefully the initiation will decrease the chance of post-herpetic neuralgias. No pain with EOM or vision changes currently to suggest further complication. - Valtrex 1g TID x 10 days  - If rash arises, pt to return to clinic  - Pt given strict precautions to seek care if: she develops vision changes, she develops pain with eye movements, she develops new weakness/change in sensation. Pt voiced understanding - RTC in 2 weeks for close follow up.

## 2015-04-02 ENCOUNTER — Encounter: Payer: Self-pay | Admitting: Family Medicine

## 2015-04-02 ENCOUNTER — Ambulatory Visit (INDEPENDENT_AMBULATORY_CARE_PROVIDER_SITE_OTHER): Payer: Medicaid Other | Admitting: Family Medicine

## 2015-04-02 VITALS — BP 120/78 | HR 119 | Temp 98.3°F | Ht 61.0 in | Wt 158.2 lb

## 2015-04-02 DIAGNOSIS — R002 Palpitations: Secondary | ICD-10-CM

## 2015-04-02 DIAGNOSIS — R202 Paresthesia of skin: Secondary | ICD-10-CM

## 2015-04-02 DIAGNOSIS — R000 Tachycardia, unspecified: Secondary | ICD-10-CM

## 2015-04-02 NOTE — Patient Instructions (Signed)
I have ordered a TSH (looking at your thyroid function)- this could be the cause of your high heart rate  I will call you if the results are NOT normal, otherwise I'll mail you a letter. Follow up with me in 3 months or sooner as needed. Please have your rheumatologist fax over your records. Shingles Shingles (herpes zoster) is an infection that is caused by the same virus that causes chickenpox (varicella). The infection causes a painful skin rash and fluid-filled blisters, which eventually break open, crust over, and heal. It may occur in any area of the body, but it usually affects only one side of the body or face. The pain of shingles usually lasts about 1 month. However, some people with shingles may develop long-term (chronic) pain in the affected area of the body. Shingles often occurs many years after the person had chickenpox. It is more common:  In people older than 50 years.  In people with weakened immune systems, such as those with HIV, AIDS, or cancer.  In people taking medicines that weaken the immune system, such as transplant medicines.  In people under great stress. CAUSES  Shingles is caused by the varicella zoster virus (VZV), which also causes chickenpox. After a person is infected with the virus, it can remain in the person's body for years in an inactive state (dormant). To cause shingles, the virus reactivates and breaks out as an infection in a nerve root. The virus can be spread from person to person (contagious) through contact with open blisters of the shingles rash. It will only spread to people who have not had chickenpox. When these people are exposed to the virus, they may develop chickenpox. They will not develop shingles. Once the blisters scab over, the person is no longer contagious and cannot spread the virus to others. SIGNS AND SYMPTOMS  Shingles shows up in stages. The initial symptoms may be pain, itching, and tingling in an area of the skin. This pain is  usually described as burning, stabbing, or throbbing.In a few days or weeks, a painful red rash will appear in the area where the pain, itching, and tingling were felt. The rash is usually on one side of the body in a band or belt-like pattern. Then, the rash usually turns into fluid-filled blisters. They will scab over and dry up in approximately 2-3 weeks. Flu-like symptoms may also occur with the initial symptoms, the rash, or the blisters. These may include:  Fever.  Chills.  Headache.  Upset stomach. DIAGNOSIS  Your health care provider will perform a skin exam to diagnose shingles. Skin scrapings or fluid samples may also be taken from the blisters. This sample will be examined under a microscope or sent to a lab for further testing. TREATMENT  There is no specific cure for shingles. Your health care provider will likely prescribe medicines to help you manage the pain, recover faster, and avoid long-term problems. This may include antiviral drugs, anti-inflammatory drugs, and pain medicines. HOME CARE INSTRUCTIONS   Take a cool bath or apply cool compresses to the area of the rash or blisters as directed. This may help with the pain and itching.   Take medicines only as directed by your health care provider.   Rest as directed by your health care provider.  Keep your rash and blisters clean with mild soap and cool water or as directed by your health care provider.  Do not pick your blisters or scratch your rash. Apply an anti-itch cream  or numbing creams to the affected area as directed by your health care provider.  Keep your shingles rash covered with a loose bandage (dressing).  Avoid skin contact with:  Babies.   Pregnant women.   Children with eczema.   Elderly people with transplants.   People with chronic illnesses, such as leukemia or AIDS.   Wear loose-fitting clothing to help ease the pain of material rubbing against the rash.  Keep all follow-up  visits as directed by your health care provider.If the area involved is on your face, you may receive a referral for a specialist, such as an eye doctor (ophthalmologist) or an ear, nose, and throat (ENT) doctor. Keeping all follow-up visits will help you avoid eye problems, chronic pain, or disability.  SEEK IMMEDIATE MEDICAL CARE IF:   You have facial pain, pain around the eye area, or loss of feeling on one side of your face.  You have ear pain or ringing in your ear.  You have loss of taste.  Your pain is not relieved with prescribed medicines.   Your redness or swelling spreads.   You have more pain and swelling.  Your condition is worsening or has changed.   You have a fever. MAKE SURE YOU:  Understand these instructions.  Will watch your condition.  Will get help right away if you are not doing well or get worse. Document Released: 10/12/2005 Document Revised: 02/26/2014 Document Reviewed: 05/26/2012 [facility] Patient Information 2015 [place], [place]. This information is not intended to replace advice given to you by your health care provider. Make sure you discuss any questions you have with your health care provider.

## 2015-04-02 NOTE — Progress Notes (Signed)
Patient ID: Kathy [name], female   DOB: [birth year], 49 y.o.   MRN: [id number] [facility]  Joanna Puff, MD  Subjective:  Chief complaint: follow up on tingling in the R side of the face  Patient presents following up on R face tingling. When she established care, she endorsed tingling on R side of her face x 1.5 weeks. She is immunosuppressed and was started on Valtrex empirically, even though no rash was noted.  She notes the tingling has improved significantly- she had 1 episode last night but hasn't had any other episodes since she started the medication.   No blurred vision. No fevers or chills. No rash noted.  Tachycardia: patient's HR has been consistently elevated here. States she occasionally feels palpitations. No chest pain or SOB. No significant weight change. Occasionally has problems with keeping her temperature regulated.   ROS- 12 pt ROS covered and negative besides HPI.   Does not smoke  Past Medical History Patient Active Problem List   Diagnosis Date Noted  . Tachycardia 04/04/2015  . 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 Tingling 03/15/2015  . Malnutrition of moderate degree 09/20/2014  . Rheumatoid arthritis flare 09/18/2014  . Hypokalemia 09/18/2014  . Loss of weight 09/18/2014  . Anorexia 09/18/2014  . Anemia 09/18/2014  . TROCHANTERIC BURSITIS, RIGHT 05/20/2010  . VITAMIN D DEFICIENCY 02/16/2010  . COUGH 02/04/2010  . RHEUMATOID ARTHRITIS 09/15/2009  . DEGENERATIVE JOINT DISEASE, GENERALIZED 07/28/2007    Medications- reviewed and updated Current Outpatient Prescriptions  Medication Sig Dispense Refill  . albuterol (PROVENTIL HFA;VENTOLIN HFA) 108 (90 BASE) MCG/ACT inhaler Inhale 1-2 puffs into the lungs every 6 (six) hours as needed for wheezing or shortness of breath.    . diclofenac sodium (VOLTAREN) 1 % GEL Apply 4 g topically 4 (four) times daily as needed (for joint pain).    . folic acid (FOLVITE) 1 MG  tablet Take 1 mg by mouth daily.    . hydroxychloroquine (PLAQUENIL) 200 MG tablet Take 200 mg by mouth daily.    . methotrexate (RHEUMATREX) 2.5 MG tablet Take 2.5 mg by mouth once a week. Caution:Chemotherapy. Protect from light.    . Multiple Vitamins-Minerals (MULTIVITAMIN PO) Take 1 tablet by mouth daily.    Marland Kitchen omeprazole (PRILOSEC) 20 MG capsule Take 20 mg by mouth daily.    . predniSONE (DELTASONE) 5 MG tablet Take 5 mg by mouth daily with breakfast.    . ranitidine (ZANTAC) 150 MG tablet Take 150 mg by mouth 2 (two) times daily.    Marland Kitchen senna (SENOKOT) 8.6 MG TABS tablet Take 1 tablet (8.6 mg total) by mouth daily as needed for mild constipation. (Patient not taking: Reported on 10/12/2014) 120 each 0  . sulfaSALAzine (AZULFIDINE) 500 MG EC tablet Take 1,000 mg by mouth 2 (two) times daily.    Marland Kitchen tiZANidine (ZANAFLEX) 4 MG tablet Take 4-8 mg by mouth every 6 (six) hours as needed for muscle spasms.    . traMADol (ULTRAM) 50 MG tablet Take 50 mg by mouth every 6 (six) hours as needed (pain).    . valACYclovir (VALTREX) 1000 MG tablet Take 1 tablet (1,000 mg total) by mouth 3 (three) times daily. 30 tablet 0   No current facility-administered medications for this visit.    Objective: BP 120/78 mmHg  Pulse 119  Temp(Src) 98.3 F (36.8 C) (Oral)  Ht 5\' 1"  (1.549 m)  Wt 158 lb 3.2 oz (71.759 kg)  BMI 29.91 kg/m2 Gen: No  acute distress. Alert, cooperative with exam HEENT: Atraumatic, PERRLA, EOMI Oropharynx clear. MMM CV: Tachycardic, regular rhythm. No murmurs, rubs, or gallops noted. 2+ radial and DP pulses bilaterally. Resp: CTAB. No wheezing, crackles, or rhonchi noted. Ext: No edema. No gross deformities. Neuro:  Facial movement symmetric. Sensation over the face intact/symmetric b/l.  Skin: No rashes noted, specifically over the face.  Assessment/Plan:   Tingling Tingling over R lateral aspect of her face (mostly in the maxillary region in the distribution of V2) appears to have  improved significantly with Valtrex.No focal deficits noted on exam. Not associated with headache or tenderness to palpation or visual change therefore less likely temporal arteritis. No h/o rash and no rashes noted on exam, however given the patient is on immunosuppressants and does not recall getting the herpes zoster vaccine (has had varicella) with improvement with Valtrex, this may be the etiology.  If this is shingles, hopefully Valtrex will decrease the chance of post-herpetic neuralgias. No pain with EOM or vision changes currently to suggest further complication. - complete Valtrex regimen - Pt given strict precautions to seek care if: she develops vision changes, she develops pain with eye movements, she develops new weakness/change in sensation. Pt voiced understanding - follow up in 3 months or sooner PRN  Tachycardia For the most part, asymptomatic with intermittent palpitations. No chest pain or SOB. From reviewing previous EKGs in the ED, she has always been slightly tachycardic in the 90s in NSR.  - Will check TSH to assess for thyroid dysfunction  - May be functional due to deconditioning.  - If TSH normal, consider EKG in clinic at next appt.    Orders Placed This Encounter  Procedures  . TSH    No orders of the defined types were placed in this encounter.

## 2015-04-03 LAB — TSH: TSH: 0.778 u[IU]/mL (ref 0.350–4.500)

## 2015-04-04 ENCOUNTER — Encounter: Payer: Self-pay | Admitting: Family Medicine

## 2015-04-04 DIAGNOSIS — R000 Tachycardia, unspecified: Secondary | ICD-10-CM | POA: Insufficient documentation

## 2015-04-04 NOTE — Assessment & Plan Note (Signed)
For the most part, asymptomatic with intermittent palpitations. No chest pain or SOB. From reviewing previous EKGs in the ED, she has always been slightly tachycardic in the 90s in NSR.  - Will check TSH to assess for thyroid dysfunction  - May be functional due to deconditioning.  - If TSH normal, consider EKG in clinic at next appt.

## 2015-04-04 NOTE — Assessment & Plan Note (Signed)
Tingling over R lateral aspect of her face (mostly in the maxillary region in the distribution of V2) appears to have improved significantly with Valtrex.No focal deficits noted on exam. Not associated with headache or tenderness to palpation or visual change therefore less likely temporal arteritis. No h/o rash and no rashes noted on exam, however given the patient is on immunosuppressants and does not recall getting the herpes zoster vaccine (has had varicella) with improvement with Valtrex, this may be the etiology.  If this is shingles, hopefully Valtrex will decrease the chance of post-herpetic neuralgias. No pain with EOM or vision changes currently to suggest further complication. - complete Valtrex regimen - Pt given strict precautions to seek care if: she develops vision changes, she develops pain with eye movements, she develops new weakness/change in sensation. Pt voiced understanding - follow up in 3 months or sooner PRN

## 2015-04-05 ENCOUNTER — Encounter: Payer: Self-pay | Admitting: Family Medicine

## 2015-04-08 ENCOUNTER — Telehealth: Payer: Self-pay | Admitting: Family Medicine

## 2015-04-08 NOTE — Telephone Encounter (Signed)
Thyroid function is normal. Letter was sent to the patient via mail.  Thanks, Joanna Puff, MD [facility] Resident  04/08/2015, 3:15 PM

## 2015-04-08 NOTE — Telephone Encounter (Signed)
Pt called and would like to know what her lab results are. jw  °

## 2015-04-09 ENCOUNTER — Telehealth: Payer: Self-pay | Admitting: Family Medicine

## 2015-04-09 NOTE — Telephone Encounter (Signed)
Pt called back and would like to speak to Dr. Leonides Schanz about her test results and what is next. Also she wanted to know did we receive her medical records from her Rheumatologist. jw

## 2015-04-09 NOTE — Telephone Encounter (Signed)
Attempted to call pt. No answer on mobile phone and non-specific voicemail therefore did not leave message. Attempted to call home number and was told they did not know anyone by that name. Thyroid function test is normal. Once again, I have mailed her a letter stating this on 6/10. At her next appointment we will talk more about her caffeine/stimulant intake and consider more work up.  Will attempt to contact the patient at a later time.  Joanna Puff, MD [facility] Resident  04/09/2015, 7:46 PM

## 2015-04-14 ENCOUNTER — Encounter (HOSPITAL_COMMUNITY): Payer: Self-pay | Admitting: *Deleted

## 2015-04-14 ENCOUNTER — Emergency Department (HOSPITAL_COMMUNITY): Payer: Medicaid Other

## 2015-04-14 ENCOUNTER — Emergency Department (HOSPITAL_COMMUNITY)
Admission: EM | Admit: 2015-04-14 | Discharge: 2015-04-14 | Disposition: A | Discharge status: Home / Self Care | Payer: Medicaid Other | Attending: Emergency Medicine | Admitting: Emergency Medicine

## 2015-04-14 DIAGNOSIS — Z79899 Other long term (current) drug therapy: Secondary | ICD-10-CM | POA: Insufficient documentation

## 2015-04-14 DIAGNOSIS — Y998 Other external cause status: Secondary | ICD-10-CM | POA: Insufficient documentation

## 2015-04-14 DIAGNOSIS — Y92810 Car as the place of occurrence of the external cause: Secondary | ICD-10-CM | POA: Diagnosis not present

## 2015-04-14 DIAGNOSIS — S8990XA Unspecified injury of unspecified lower leg, initial encounter: Secondary | ICD-10-CM

## 2015-04-14 DIAGNOSIS — M1711 Unilateral primary osteoarthritis, right knee: Secondary | ICD-10-CM | POA: Insufficient documentation

## 2015-04-14 DIAGNOSIS — Y9389 Activity, other specified: Secondary | ICD-10-CM | POA: Diagnosis not present

## 2015-04-14 DIAGNOSIS — Z7952 Long term (current) use of systemic steroids: Secondary | ICD-10-CM | POA: Insufficient documentation

## 2015-04-14 DIAGNOSIS — M19071 Primary osteoarthritis, right ankle and foot: Secondary | ICD-10-CM | POA: Diagnosis not present

## 2015-04-14 DIAGNOSIS — W230XXA Caught, crushed, jammed, or pinched between moving objects, initial encounter: Secondary | ICD-10-CM | POA: Insufficient documentation

## 2015-04-14 DIAGNOSIS — S8991XA Unspecified injury of right lower leg, initial encounter: Secondary | ICD-10-CM | POA: Diagnosis not present

## 2015-04-14 MED ORDER — IBUPROFEN 600 MG PO TABS: 600.0000 mg | ORAL_TABLET | Freq: Four times a day (QID) | ORAL | Status: DC | PRN

## 2015-04-14 MED ORDER — IBUPROFEN 400 MG PO TABS
800.0000 mg | ORAL_TABLET | Freq: Once | ORAL | Status: AC
  Administered 2015-04-14: 800 mg via ORAL
  Filled 2015-04-14: qty 2

## 2015-04-14 NOTE — ED Notes (Signed)
Pt. Left with all belongings and refused wheelchair 

## 2015-04-14 NOTE — ED Notes (Signed)
The pt is c/i pain in her rt lower leg and up into her knee.  She was struck in the rt knee by a car door today

## 2015-04-14 NOTE — ED Provider Notes (Signed)
CSN: [id number]     Arrival date & time 04/14/15  2008 History  This chart was scribed for Karmen Stabs, PA-C, working with Linwood Dibbles, MD by Elon Spanner, ED Scribe. This patient was seen in room TR07C/TR07C and the patient's care was started at 9:02 PM.   Chief Complaint  Patient presents with  . Knee Pain   The history is provided by the patient. No language interpreter was used.   HPI Comments: Kathy [name] is a 49 y.o. female who presents to the Emergency Department complaining of pain from the right ankle to right knee onset 45 minutes ago after the patient accidentally closed her right lower extremity in a car door today..  She also notes some swelling and a slight tingling sensation in the right ankle.  The pain is worse with movement, staying still. Patient is not taken any pain medications. A shunt with history of RA.  Marland Kitchen     Past Medical History  Diagnosis Date  . Rheumatoid arteritis    History reviewed. No pertinent past surgical history. Family History  Problem Relation Age of Onset  . Asthma Mother   . Diabetes Mother   . Hypertension Mother   . Heart disease Mother   . Asthma Father   . Cancer Father     lung  . Cancer Sister     breast at 83  . Cancer Brother     unknown   History  Substance Use Topics  . Smoking status: Never Smoker   . Smokeless tobacco: Not on file  . Alcohol Use: No   OB History    No data available     Review of Systems  Gastrointestinal: Negative for nausea and vomiting.  Musculoskeletal: Positive for arthralgias. Negative for gait problem.  Skin: Negative for color change and wound.      Allergies  Methotrexate derivatives and Aspirin  Home Medications   Prior to Admission medications   Medication Sig Start Date End Date Taking? Authorizing Provider  albuterol (PROVENTIL HFA;VENTOLIN HFA) 108 (90 BASE) MCG/ACT inhaler Inhale 1-2 puffs into the lungs every 6 (six) hours as needed for wheezing or shortness of breath.     Historical Provider, MD  diclofenac sodium (VOLTAREN) 1 % GEL Apply 4 g topically 4 (four) times daily as needed (for joint pain).    Historical Provider, MD  folic acid (FOLVITE) 1 MG tablet Take 1 mg by mouth daily.    Historical Provider, MD  hydroxychloroquine (PLAQUENIL) 200 MG tablet Take 200 mg by mouth daily.    Historical Provider, MD  ibuprofen (ADVIL,MOTRIN) 600 MG tablet Take 1 tablet (600 mg total) by mouth every 6 (six) hours as needed. 04/14/15   Oswaldo Conroy, PA-C  methotrexate (RHEUMATREX) 2.5 MG tablet Take 2.5 mg by mouth once a week. Caution:Chemotherapy. Protect from light.    Historical Provider, MD  Multiple Vitamins-Minerals (MULTIVITAMIN PO) Take 1 tablet by mouth daily.    Historical Provider, MD  omeprazole (PRILOSEC) 20 MG capsule Take 20 mg by mouth daily.    Historical Provider, MD  predniSONE (DELTASONE) 5 MG tablet Take 5 mg by mouth daily with breakfast.    Historical Provider, MD  ranitidine (ZANTAC) 150 MG tablet Take 150 mg by mouth 2 (two) times daily.    Historical Provider, MD  senna (SENOKOT) 8.6 MG TABS tablet Take 1 tablet (8.6 mg total) by mouth daily as needed for mild constipation. Patient not taking: Reported on 10/12/2014 09/20/14   Jai-Gurmukh  Samtani, MD  sulfaSALAzine (AZULFIDINE) 500 MG EC tablet Take 1,000 mg by mouth 2 (two) times daily.    Historical Provider, MD  tiZANidine (ZANAFLEX) 4 MG tablet Take 4-8 mg by mouth every 6 (six) hours as needed for muscle spasms.    Historical Provider, MD  traMADol (ULTRAM) 50 MG tablet Take 50 mg by mouth every 6 (six) hours as needed (pain).    Historical Provider, MD  valACYclovir (VALTREX) 1000 MG tablet Take 1 tablet (1,000 mg total) by mouth 3 (three) times daily. 03/15/15   Joanna Puff, MD   BP 100/68 mmHg  Pulse 95  Temp(Src) 98.4 F (36.9 C)  Resp 16  Ht 5\' 2"  (1.575 m)  Wt 152 lb 6 oz (69.117 kg)  BMI 27.86 kg/m2  SpO2 98% Physical Exam  Constitutional: She appears well-developed and  well-nourished. No distress.  HENT:  Head: Normocephalic and atraumatic.  Eyes: Conjunctivae are normal. Right eye exhibits no discharge. Left eye exhibits no discharge.  Cardiovascular:   2+ DP/PT pulses equal bilaterally.    Pulmonary/Chest: Effort normal. No respiratory distress.  Musculoskeletal:  Pain with dorsiflexion of right toes.  Tenderness to right lateral malleolus with mild edema.  No erythema  No warmth, erythema, wounds, or effusion to right knee; diffuse pain and tenderness to right knee.  Negative anterior and posterior drawer test.  No ligamentous laxity.  Antalgic gait.    Neurological: She is alert. Coordination normal.  Sensation intact.   Skin: She is not diaphoretic.  Psychiatric: She has a normal mood and affect. Her behavior is normal.  Nursing note and vitals reviewed.   ED Course  Procedures (including critical care time)  DIAGNOSTIC STUDIES: Oxygen Saturation is 95% on RA, adequate by my interpretation.    COORDINATION OF CARE:    Labs Review Labs Reviewed - No data to display  Imaging Review Dg Ankle Complete Right  04/14/2015   CLINICAL DATA:  Patient status post injury to the lower leg. Pain in the anterior ankle and anterior knee.  EXAM: RIGHT ANKLE - COMPLETE 3+ VIEW  COMPARISON:  None.  FINDINGS: Normal anatomic alignment. No evidence for acute fracture or dislocation. Talar dome is intact. No overlying soft tissue swelling. Marked midfoot and hindfoot degenerative changes. Posterior and plantar calcaneal spurring.  IMPRESSION: No acute osseous abnormality.  Marked degenerative changes.   Electronically Signed   By: 04/16/2015 M.D.   On: 04/14/2015 21:38   Dg Knee Complete 4 Views Right  04/14/2015   CLINICAL DATA:  Pain after patient struck in lower leg region earlier this evening  EXAM: RIGHT KNEE - COMPLETE 4+ VIEW  COMPARISON:  June 11, 2008 radiographs and right knee MRI June 27, 2008  FINDINGS: Frontal, lateral, and bilateral oblique  views were obtained. There is no demonstrable fracture or dislocation. There is no appreciable joint effusion. There is advanced osteoarthritis in all compartments, most marked laterally. There is spurring in all compartments, most severely in the patellofemoral and lateral regions. No erosive change.  IMPRESSION: Advanced osteoarthritis. No fracture or joint effusion. There is been slight progression of arthropathy compared to the 2009 studies, although arthropathy was advanced at that time.   Electronically Signed   By: 2010 III M.D.   On: 04/14/2015 21:36     EKG Interpretation None      MDM   Final diagnoses:  Osteoarthritis of right ankle, unspecified osteoarthritis type  Osteoarthritis of right knee, unspecified osteoarthritis type  Lower extremity  injury, unspecified laterality, initial encounter   Patient presenting with right lower extremity injury in car door. Pain managed in ED. Neurovascularly intact. X-ray with advanced arthritis consistent with patient's history of RA but no acute abnormalities or effusion. Full range of motion. Patient ambulatory. No erythema, swelling, warmth. I doubt septic arthritis. Patient afebrile. Patient given brace in the ED and discuss Rice in ibuprofen use and follow-up with primary doctor as well as orthopedics for persistent pain.  Discussed return precautions with patient. Discussed all results and patient verbalizes understanding and agrees with plan.  I personally performed the services described in this documentation, which was scribed in my presence. The recorded information has been reviewed and is accurate.   Oswaldo Conroy, PA-C 04/14/15 2225  Linwood Dibbles, MD 04/15/15 [phone]

## 2015-04-14 NOTE — Discharge Instructions (Signed)
Return to the emergency room with worsening of symptoms, new symptoms or with symptoms that are concerning, especially fevers, redness, swelling, warmth, unable to move ankle or knee, numbness, tingling. RICE: Rest, Ice (three cycles of 20 mins on, off at least twice a day), compression/brace, elevation. Heating pad works well for back pain. Ibuprofen 400mg  (2 tablets 200mg ) every 5-6 hours for 3-5 days. Follow up with PCP/orthopedist if symptoms worsen or are persistent. Read below information and follow recommendations.  Arthritis, Nonspecific Arthritis is inflammation of a joint. This usually means pain, redness, warmth or swelling are present. One or more joints may be involved. There are a number of types of arthritis. Your caregiver may not be able to tell what type of arthritis you have right away. CAUSES  The most common cause of arthritis is the wear and tear on the joint (osteoarthritis). This causes damage to the cartilage, which can break down over time. The knees, hips, back and neck are most often affected by this type of arthritis. Other types of arthritis and common causes of joint pain include:  Sprains and other injuries near the joint. Sometimes minor sprains and injuries cause pain and swelling that develop hours later.  Rheumatoid arthritis. This affects hands, feet and knees. It usually affects both sides of your body at the same time. It is often associated with chronic ailments, fever, weight loss and general weakness.  Crystal arthritis. Gout and pseudo gout can cause occasional acute severe pain, redness and swelling in the foot, ankle, or knee.  Infectious arthritis. Bacteria can get into a joint through a break in overlying skin. This can cause infection of the joint. Bacteria and viruses can also spread through the blood and affect your joints.  Drug, infectious and allergy reactions. Sometimes joints can become mildly painful and slightly swollen with these  types of illnesses. SYMPTOMS   Pain is the main symptom.  Your joint or joints can also be red, swollen and warm or hot to the touch.  You may have a fever with certain types of arthritis, or even feel overall ill.  The joint with arthritis will hurt with movement. Stiffness is present with some types of arthritis. DIAGNOSIS  Your caregiver will suspect arthritis based on your description of your symptoms and on your exam. Testing may be needed to find the type of arthritis:  Blood and sometimes urine tests.  X-ray tests and sometimes CT or MRI scans.  Removal of fluid from the joint (arthrocentesis) is done to check for bacteria, crystals or other causes. Your caregiver (or a specialist) will numb the area over the joint with a local anesthetic, and use a needle to remove joint fluid for examination. This procedure is only minimally uncomfortable.  Even with these tests, your caregiver may not be able to tell what kind of arthritis you have. Consultation with a specialist (rheumatologist) may be helpful. TREATMENT  Your caregiver will discuss with you treatment specific to your type of arthritis. If the specific type cannot be determined, then the following general recommendations may apply. Treatment of severe joint pain includes:  Rest.  Elevation.  Anti-inflammatory medication (for example, ibuprofen) may be prescribed. Avoiding activities that cause increased pain.  Only take over-the-counter or prescription medicines for pain and discomfort as recommended by your caregiver.  Cold packs over an inflamed joint may be used for 10 to 15 minutes every hour. Hot packs sometimes feel better, but do not use overnight. Do not use hot packs  if you are diabetic without your caregiver's permission.  A cortisone shot into arthritic joints may help reduce pain and swelling.  Any acute arthritis that gets worse over the next 1 to 2 days needs to be looked at to be sure there is no joint  infection. Long-term arthritis treatment involves modifying activities and lifestyle to reduce joint stress jarring. This can include weight loss. Also, exercise is needed to nourish the joint cartilage and remove waste. This helps keep the muscles around the joint strong. HOME CARE INSTRUCTIONS   Do not take aspirin to relieve pain if gout is suspected. This elevates uric acid levels.  Only take over-the-counter or prescription medicines for pain, discomfort or fever as directed by your caregiver.  Rest the joint as much as possible.  If your joint is swollen, keep it elevated.  Use crutches if the painful joint is in your leg.  Drinking plenty of fluids may help for certain types of arthritis.  Follow your caregiver's dietary instructions.  Try low-impact exercise such as:  Swimming.  Water aerobics.  Biking.  Walking.  Morning stiffness is often relieved by a warm shower.  Put your joints through regular range-of-motion. SEEK MEDICAL CARE IF:   You do not feel better in 24 hours or are getting worse.  You have side effects to medications, or are not getting better with treatment. SEEK IMMEDIATE MEDICAL CARE IF:   You have a fever.  You develop severe joint pain, swelling or redness.  Many joints are involved and become painful and swollen.  There is severe back pain and/or leg weakness.  You have loss of bowel or bladder control. Document Released: 11/19/2004 Document Revised: 01/04/2012 Document Reviewed: 12/05/2008 [facility] Patient Information 2015 [place], [place]. This information is not intended to replace advice given to you by your health care provider. Make sure you discuss any questions you have with your health care provider.

## 2015-05-24 ENCOUNTER — Other Ambulatory Visit (HOSPITAL_COMMUNITY): Payer: Self-pay | Admitting: Orthopaedic Surgery

## 2015-06-06 ENCOUNTER — Emergency Department (HOSPITAL_COMMUNITY)
Admission: EM | Admit: 2015-06-06 | Discharge: 2015-06-06 | Disposition: A | Discharge status: Home / Self Care | Payer: Medicaid Other | Attending: Emergency Medicine | Admitting: Emergency Medicine

## 2015-06-06 ENCOUNTER — Encounter (HOSPITAL_COMMUNITY): Payer: Self-pay | Admitting: Emergency Medicine

## 2015-06-06 DIAGNOSIS — Z7952 Long term (current) use of systemic steroids: Secondary | ICD-10-CM | POA: Diagnosis not present

## 2015-06-06 DIAGNOSIS — Z79899 Other long term (current) drug therapy: Secondary | ICD-10-CM | POA: Diagnosis not present

## 2015-06-06 DIAGNOSIS — F419 Anxiety disorder, unspecified: Secondary | ICD-10-CM | POA: Insufficient documentation

## 2015-06-06 DIAGNOSIS — M069 Rheumatoid arthritis, unspecified: Secondary | ICD-10-CM | POA: Insufficient documentation

## 2015-06-06 DIAGNOSIS — R42 Dizziness and giddiness: Secondary | ICD-10-CM | POA: Diagnosis present

## 2015-06-06 MED ORDER — LORAZEPAM 1 MG PO TABS: 1.0000 mg | ORAL_TABLET | Freq: Two times a day (BID) | ORAL | Status: DC | PRN

## 2015-06-06 NOTE — ED Notes (Signed)
Pt left with all belongings and ambulated out of treatment area.  

## 2015-06-06 NOTE — Discharge Instructions (Signed)
See your doctor as discussed. Take anxiety meds only when having severe symptoms.   Panic Attacks Panic attacks are sudden, short-livedsurges of severe anxiety, fear, or discomfort. They may occur for no reason when you are relaxed, when you are anxious, or when you are sleeping. Panic attacks may occur for a number of reasons:   Healthy people occasionally have panic attacks in extreme, life-threatening situations, such as war or natural disasters. Normal anxiety is a protective mechanism of the body that helps [place] react to danger (fight or flight response).  Panic attacks are often seen with anxiety disorders, such as panic disorder, social anxiety disorder, generalized anxiety disorder, and phobias. Anxiety disorders cause excessive or uncontrollable anxiety. They may interfere with your relationships or other life activities.  Panic attacks are sometimes seen with other mental illnesses, such as depression and posttraumatic stress disorder.  Certain medical conditions, prescription medicines, and drugs of abuse can cause panic attacks. SYMPTOMS  Panic attacks start suddenly, peak within 20 minutes, and are accompanied by four or more of the following symptoms:  Pounding heart or fast heart rate (palpitations).  Sweating.  Trembling or shaking.  Shortness of breath or feeling smothered.  Feeling choked.  Chest pain or discomfort.  Nausea or strange feeling in your stomach.  Dizziness, light-headedness, or feeling like you will faint.  Chills or hot flushes.  Numbness or tingling in your lips or hands and feet.  Feeling that things are not real or feeling that you are not yourself.  Fear of losing control or going crazy.  Fear of dying. Some of these symptoms can mimic serious medical conditions. For example, you may think you are having a heart attack. Although panic attacks can be very scary, they are not life threatening. DIAGNOSIS  Panic attacks are diagnosed through  an assessment by your health care provider. Your health care provider will ask questions about your symptoms, such as where and when they occurred. Your health care provider will also ask about your medical history and use of alcohol and drugs, including prescription medicines. Your health care provider may order blood tests or other studies to rule out a serious medical condition. Your health care provider may refer you to a mental health professional for further evaluation. TREATMENT   Most healthy people who have one or two panic attacks in an extreme, life-threatening situation will not require treatment.  The treatment for panic attacks associated with anxiety disorders or other mental illness typically involves counseling with a mental health professional, medicine, or a combination of both. Your health care provider will help determine what treatment is best for you.  Panic attacks due to physical illness usually go away with treatment of the illness. If prescription medicine is causing panic attacks, talk with your health care provider about stopping the medicine, decreasing the dose, or substituting another medicine.  Panic attacks due to alcohol or drug abuse go away with abstinence. Some adults need professional help in order to stop drinking or using drugs. HOME CARE INSTRUCTIONS   Take all medicines as directed by your health care provider.   Schedule and attend follow-up visits as directed by your health care provider. It is important to keep all your appointments. SEEK MEDICAL CARE IF:  You are not able to take your medicines as prescribed.  Your symptoms do not improve or get worse. SEEK IMMEDIATE MEDICAL CARE IF:   You experience panic attack symptoms that are different than your usual symptoms.  You have serious  thoughts about hurting yourself or others.  You are taking medicine for panic attacks and have a serious side effect. MAKE SURE YOU:  Understand these  instructions.  Will watch your condition.  Will get help right away if you are not doing well or get worse. Document Released: 10/12/2005 Document Revised: 10/17/2013 Document Reviewed: 05/26/2013 [facility] Patient Information 2015 [place], [place]. This information is not intended to replace advice given to you by your health care provider. Make sure you discuss any questions you have with your health care provider.

## 2015-06-06 NOTE — ED Provider Notes (Signed)
CSN: [id number]     Arrival date & time 06/06/15  0122 History  This chart was scribed for Derwood Kaplan, MD by Phillis Haggis, ED Scribe. This patient was seen in room A08C/A08C and patient care was started at 3:30 AM.    Chief Complaint  Patient presents with  . Dizziness   The history is provided by the patient. No language interpreter was used.  HPI Comments: Kathy [name] is a 49 y.o. female with hx of anxiety who presents to the Emergency Department complaining of dizziness and mild panic attack onset earlier this evening. Pt states that she then woke up with anxiety, breathing fast, felt like her heart rate was up, and was shaky, felt off balance and dizzy. She states that this is typical of her previous anxiety attacks. She is unsure why she had a panic attack. She states that she drank water after and that her symptoms have gradually resolved.  She denies ever being on anxiety medication and has never been formally diagnosed. She states that it has been a while since she last had an anxiety attack.  Past Medical History  Diagnosis Date  . Rheumatoid arteritis    Past Surgical History  Procedure Laterality Date  . Abdominal hysterectomy     Family History  Problem Relation Age of Onset  . Asthma Mother   . Diabetes Mother   . Hypertension Mother   . Heart disease Mother   . Asthma Father   . Cancer Father     lung  . Cancer Sister     breast at 20  . Cancer Brother     unknown   Social History  Substance Use Topics  . Smoking status: Never Smoker   . Smokeless tobacco: None  . Alcohol Use: No   OB History    No data available     Review of Systems  Constitutional: Negative for activity change.  Eyes: Negative for visual disturbance.  Respiratory: Negative for shortness of breath.   Neurological: Positive for dizziness. Negative for weakness and numbness.  Psychiatric/Behavioral: The patient is nervous/anxious.    Allergies  Methotrexate derivatives;  Peanut-containing drug products; Aspirin; and Tomato  Home Medications   Prior to Admission medications   Medication Sig Start Date End Date Taking? Authorizing Provider  albuterol (PROVENTIL HFA;VENTOLIN HFA) 108 (90 BASE) MCG/ACT inhaler Inhale 1-2 puffs into the lungs every 6 (six) hours as needed for wheezing or shortness of breath.   Yes Historical Provider, MD  diclofenac sodium (VOLTAREN) 1 % GEL Apply 4 g topically 4 (four) times daily as needed (for joint pain).   Yes Historical Provider, MD  folic acid (FOLVITE) 1 MG tablet Take 1 mg by mouth daily.   Yes Historical Provider, MD  hydroxychloroquine (PLAQUENIL) 200 MG tablet Take 200 mg by mouth daily.   Yes Historical Provider, MD  Multiple Vitamins-Minerals (MULTIVITAMIN PO) Take 1 tablet by mouth daily.   Yes Historical Provider, MD  omeprazole (PRILOSEC) 20 MG capsule Take 20 mg by mouth daily.   Yes Historical Provider, MD  predniSONE (DELTASONE) 5 MG tablet Take 5 mg by mouth 2 (two) times daily with a meal.    Yes Historical Provider, MD  ranitidine (ZANTAC) 150 MG tablet Take 150 mg by mouth 2 (two) times daily.   Yes Historical Provider, MD  sulfaSALAzine (AZULFIDINE) 500 MG EC tablet Take 1,000 mg by mouth 2 (two) times daily.   Yes Historical Provider, MD  tiZANidine (ZANAFLEX) 4 MG tablet  Take 4-8 mg by mouth every 6 (six) hours as needed for muscle spasms.   Yes Historical Provider, MD  traMADol (ULTRAM) 50 MG tablet Take 50 mg by mouth every 6 (six) hours as needed (pain).   Yes Historical Provider, MD  ibuprofen (ADVIL,MOTRIN) 600 MG tablet Take 1 tablet (600 mg total) by mouth every 6 (six) hours as needed. Patient not taking: Reported on 06/06/2015 04/14/15   Oswaldo Conroy, PA-C  LORazepam (ATIVAN) 1 MG tablet Take 1 tablet (1 mg total) by mouth 2 (two) times daily as needed for anxiety. 06/06/15   Derwood Kaplan, MD  senna (SENOKOT) 8.6 MG TABS tablet Take 1 tablet (8.6 mg total) by mouth daily as needed for mild  constipation. Patient not taking: Reported on 10/12/2014 09/20/14   Rhetta Mura, MD  valACYclovir (VALTREX) 1000 MG tablet Take 1 tablet (1,000 mg total) by mouth 3 (three) times daily. Patient not taking: Reported on 06/06/2015 03/15/15   Joanna Puff, MD   BP 120/77 mmHg  Pulse 82  Temp(Src) 97.6 F (36.4 C) (Oral)  Resp 20  SpO2 98%  Physical Exam  Constitutional: She is oriented to person, place, and time. She appears well-developed and well-nourished.  HENT:  Head: Normocephalic and atraumatic.  Eyes: Conjunctivae are normal.  Neck: Normal range of motion.  Cardiovascular: Normal rate and regular rhythm.   Pulmonary/Chest: Effort normal. No respiratory distress.  Abdominal: Soft. Bowel sounds are normal. There is no tenderness.  Neurological: She is alert and oriented to person, place, and time. Coordination normal.  Skin: Skin is warm and dry. No rash noted. She is not diaphoretic. No erythema.  Psychiatric: She has a normal mood and affect.  Nursing note and vitals reviewed.   ED Course  Procedures (including critical care time) DIAGNOSTIC STUDIES: Oxygen Saturation is 98% on RA, normal by my interpretation.    COORDINATION OF CARE: 3:46 AM-Discussed treatment plan which includes Ativan and follow up with PCP with pt at bedside and pt agreed to plan.   Labs Review Labs Reviewed - No data to display  Imaging Review No results found.   EKG Interpretation None      MDM   Final diagnoses:  Anxiety    I personally performed the services described in this documentation, which was scribed in my presence. The recorded information has been reviewed and is accurate.  Pt comes in with cc of dizziness. She has no symptoms at the moment, and reports waking up in the middle of the night with anxiety like symptoms. Her neuro exam is non focal. Pt has no hard risk factors for TIA, stroke - and she has had similar sx in the past with her anxiety, so we will tx  this as anxiety.  Pt advised to see her PCP soon.   Derwood Kaplan, MD 06/06/15 [phone]

## 2015-06-06 NOTE — ED Notes (Signed)
Pt. reports " my head is swimming " and mild panic attack this evening , denies SOB , alert and oriented/ambulatory .

## 2015-06-07 ENCOUNTER — Encounter (HOSPITAL_COMMUNITY): Payer: Self-pay

## 2015-06-07 ENCOUNTER — Encounter (HOSPITAL_COMMUNITY)
Admission: RE | Admit: 2015-06-07 | Discharge: 2015-06-07 | Disposition: A | Discharge status: Home / Self Care | Payer: Medicaid Other | Source: Ambulatory Visit | Attending: Orthopaedic Surgery | Admitting: Orthopaedic Surgery

## 2015-06-07 DIAGNOSIS — M069 Rheumatoid arthritis, unspecified: Secondary | ICD-10-CM | POA: Diagnosis not present

## 2015-06-07 DIAGNOSIS — M179 Osteoarthritis of knee, unspecified: Secondary | ICD-10-CM | POA: Diagnosis not present

## 2015-06-07 DIAGNOSIS — Z01812 Encounter for preprocedural laboratory examination: Secondary | ICD-10-CM | POA: Insufficient documentation

## 2015-06-07 HISTORY — DX: Unspecified asthma, uncomplicated: J45.909

## 2015-06-07 HISTORY — DX: Gastro-esophageal reflux disease without esophagitis: K21.9

## 2015-06-07 LAB — CBC
HCT: 33.7 % — ABNORMAL LOW (ref 36.0–46.0)
Hemoglobin: 10.7 g/dL — ABNORMAL LOW (ref 12.0–15.0)
MCH: 29 pg (ref 26.0–34.0)
MCHC: 31.8 g/dL (ref 30.0–36.0)
MCV: 91.3 fL (ref 78.0–100.0)
PLATELETS: 320 10*3/uL (ref 150–400)
RBC: 3.69 MIL/uL — ABNORMAL LOW (ref 3.87–5.11)
RDW: 14.1 % (ref 11.5–15.5)
WBC: 8.7 10*3/uL (ref 4.0–10.5)

## 2015-06-07 LAB — SURGICAL PCR SCREEN
MRSA, PCR: NEGATIVE
Staphylococcus aureus: NEGATIVE

## 2015-06-07 LAB — BASIC METABOLIC PANEL
Anion gap: 8 (ref 5–15)
BUN: 10 mg/dL (ref 6–20)
CHLORIDE: 106 mmol/L (ref 101–111)
CO2: 24 mmol/L (ref 22–32)
Calcium: 9.2 mg/dL (ref 8.9–10.3)
Creatinine, Ser: 0.67 mg/dL (ref 0.44–1.00)
GFR calc Af Amer: >60 mL/min (ref >60)
GFR calc non Af Amer: >60 mL/min (ref >60)
Glucose, Bld: 95 mg/dL (ref 65–99)
Potassium: 4 mmol/L (ref 3.5–5.1)
SODIUM: 138 mmol/L (ref 135–145)

## 2015-06-07 NOTE — Pre-Procedure Instructions (Signed)
Kathy [name]  06/07/2015     Your procedure is scheduled on : Tuesday June 18, 2015   Report to [facility] Admitting at 10:30 A.M.  Call this number if you have problems the morning of surgery: [phone]   Remember:  Do not eat food or drink liquids after midnight Monday.   Take these medicines the morning of surgery with A SIP OF WATER : Albuterol inhaler if needed, Lorazepam (Ativan) if needed, Omeprazole (Prilosec), Prednisone, Ranitidine (Zantac), Tizanidine (Zanaflex) if needed, Tramadol (Ultram) if needed   Please stop taking any vitamins, herbal medications, Ibuprofen, Advil, Motrin, Aleve 4-5 days prior to surgery.   Do not wear jewelry, make-up or nail polish.  Do not wear lotions, powders, or perfumes.   Do not shave underarms & legs 48 hours prior to surgery.    Do not bring valuables to the hospital.  [facility] is not responsible for any belongings or valuables.  Contacts, dentures or bridgework may not be worn into surgery.  Leave your suitcase in the car.  After surgery it may be brought to your room.  For patients admitted to the hospital, discharge time will be determined by your treatment team.  Name and phone number of your driver:     Special instructions:  Shower using CHG soap the night before and the morning of your surgery  Please read over the following fact sheets that you were given. Pain Booklet, Coughing and Deep Breathing, MRSA Information and Surgical Site Infection Prevention

## 2015-06-07 NOTE — Progress Notes (Signed)
Denies any cardiac problems or issues.   States her rheumatologist is Dr. Merilynn Finland severe arthritis.

## 2015-06-17 MED ORDER — TRANEXAMIC ACID 1000 MG/10ML IV SOLN
1000.0000 mg | INTRAVENOUS | Status: DC
  Filled 2015-06-17: qty 10

## 2015-06-17 MED ORDER — CEFAZOLIN SODIUM-DEXTROSE 2-3 GM-% IV SOLR
2.0000 g | INTRAVENOUS | Status: AC
  Administered 2015-06-18: 2 g via INTRAVENOUS
  Filled 2015-06-17: qty 50

## 2015-06-17 NOTE — Progress Notes (Signed)
I notified patient that she should arrive at 1215PM for OR.

## 2015-06-18 ENCOUNTER — Encounter (HOSPITAL_COMMUNITY): Payer: Self-pay

## 2015-06-18 ENCOUNTER — Inpatient Hospital Stay (HOSPITAL_COMMUNITY)
Admission: RE | Admit: 2015-06-18 | Discharge: 2015-06-21 | DRG: 470 | Disposition: A | Discharge status: Home Health | Payer: Medicaid Other | Source: Ambulatory Visit | Attending: Orthopaedic Surgery | Admitting: Orthopaedic Surgery

## 2015-06-18 ENCOUNTER — Encounter (HOSPITAL_COMMUNITY)
Admission: RE | Disposition: A | Discharge status: Home Health | Payer: Self-pay | Source: Ambulatory Visit | Attending: Orthopaedic Surgery

## 2015-06-18 ENCOUNTER — Inpatient Hospital Stay (HOSPITAL_COMMUNITY): Payer: Medicaid Other | Admitting: Anesthesiology

## 2015-06-18 ENCOUNTER — Inpatient Hospital Stay (HOSPITAL_COMMUNITY): Payer: Medicaid Other

## 2015-06-18 DIAGNOSIS — K219 Gastro-esophageal reflux disease without esophagitis: Secondary | ICD-10-CM | POA: Diagnosis present

## 2015-06-18 DIAGNOSIS — Z886 Allergy status to analgesic agent status: Secondary | ICD-10-CM

## 2015-06-18 DIAGNOSIS — Z825 Family history of asthma and other chronic lower respiratory diseases: Secondary | ICD-10-CM | POA: Diagnosis not present

## 2015-06-18 DIAGNOSIS — M25561 Pain in right knee: Secondary | ICD-10-CM | POA: Diagnosis present

## 2015-06-18 DIAGNOSIS — M069 Rheumatoid arthritis, unspecified: Secondary | ICD-10-CM

## 2015-06-18 DIAGNOSIS — Z833 Family history of diabetes mellitus: Secondary | ICD-10-CM | POA: Diagnosis not present

## 2015-06-18 DIAGNOSIS — Z91018 Allergy to other foods: Secondary | ICD-10-CM | POA: Diagnosis not present

## 2015-06-18 DIAGNOSIS — Z96651 Presence of right artificial knee joint: Secondary | ICD-10-CM

## 2015-06-18 DIAGNOSIS — D62 Acute posthemorrhagic anemia: Secondary | ICD-10-CM | POA: Diagnosis not present

## 2015-06-18 DIAGNOSIS — M1711 Unilateral primary osteoarthritis, right knee: Secondary | ICD-10-CM | POA: Diagnosis not present

## 2015-06-18 DIAGNOSIS — Z8249 Family history of ischemic heart disease and other diseases of the circulatory system: Secondary | ICD-10-CM | POA: Diagnosis not present

## 2015-06-18 HISTORY — PX: TOTAL KNEE ARTHROPLASTY: SHX125

## 2015-06-18 SURGERY — ARTHROPLASTY, KNEE, TOTAL
Anesthesia: General | Site: Knee | Laterality: Right

## 2015-06-18 MED ORDER — SULFASALAZINE 500 MG PO TBEC
1000.0000 mg | DELAYED_RELEASE_TABLET | Freq: Two times a day (BID) | ORAL | Status: DC
  Administered 2015-06-18 – 2015-06-21 (×6): 1000 mg via ORAL
  Filled 2015-06-18 (×7): qty 2

## 2015-06-18 MED ORDER — PREDNISONE 5 MG PO TABS
5.0000 mg | ORAL_TABLET | Freq: Two times a day (BID) | ORAL | Status: DC
  Administered 2015-06-18 – 2015-06-21 (×6): 5 mg via ORAL
  Filled 2015-06-18 (×6): qty 1

## 2015-06-18 MED ORDER — GLYCOPYRROLATE 0.2 MG/ML IJ SOLN
INTRAMUSCULAR | Status: AC
  Filled 2015-06-18: qty 3

## 2015-06-18 MED ORDER — PROPOFOL 10 MG/ML IV BOLUS
INTRAVENOUS | Status: DC | PRN
  Administered 2015-06-18: 30 mg via INTRAVENOUS
  Administered 2015-06-18: 200 mg via INTRAVENOUS
  Administered 2015-06-18: 100 mg via INTRAVENOUS

## 2015-06-18 MED ORDER — PHENYLEPHRINE HCL 10 MG/ML IJ SOLN
INTRAMUSCULAR | Status: DC | PRN
  Administered 2015-06-18 (×2): 80 ug via INTRAVENOUS
  Administered 2015-06-18: 40 ug via INTRAVENOUS

## 2015-06-18 MED ORDER — PHENOL 1.4 % MT LIQD: 1.0000 | OROMUCOSAL | Status: DC | PRN

## 2015-06-18 MED ORDER — MIDAZOLAM HCL 2 MG/2ML IJ SOLN
INTRAMUSCULAR | Status: AC
  Administered 2015-06-18: 1 mg
  Filled 2015-06-18: qty 2

## 2015-06-18 MED ORDER — ACETAMINOPHEN 325 MG PO TABS
650.0000 mg | ORAL_TABLET | Freq: Four times a day (QID) | ORAL | Status: DC | PRN
  Administered 2015-06-19 (×2): 650 mg via ORAL
  Filled 2015-06-18 (×2): qty 2

## 2015-06-18 MED ORDER — MIDAZOLAM HCL 2 MG/2ML IJ SOLN
INTRAMUSCULAR | Status: AC
  Filled 2015-06-18: qty 4

## 2015-06-18 MED ORDER — BUPIVACAINE-EPINEPHRINE (PF) 0.5% -1:200000 IJ SOLN
INTRAMUSCULAR | Status: DC | PRN
  Administered 2015-06-18: 30 mL via PERINEURAL

## 2015-06-18 MED ORDER — RIVAROXABAN 10 MG PO TABS
10.0000 mg | ORAL_TABLET | Freq: Every day | ORAL | Status: DC
  Administered 2015-06-19 – 2015-06-21 (×3): 10 mg via ORAL
  Filled 2015-06-18 (×3): qty 1

## 2015-06-18 MED ORDER — PROPOFOL 10 MG/ML IV BOLUS
INTRAVENOUS | Status: AC
  Filled 2015-06-18: qty 20

## 2015-06-18 MED ORDER — FENTANYL CITRATE (PF) 100 MCG/2ML IJ SOLN
INTRAMUSCULAR | Status: AC
  Administered 2015-06-18: 100 ug
  Filled 2015-06-18: qty 2

## 2015-06-18 MED ORDER — ZOLPIDEM TARTRATE 5 MG PO TABS: 5.0000 mg | ORAL_TABLET | Freq: Every evening | ORAL | Status: DC | PRN

## 2015-06-18 MED ORDER — DEXAMETHASONE SODIUM PHOSPHATE 10 MG/ML IJ SOLN
INTRAMUSCULAR | Status: AC
  Filled 2015-06-18: qty 1

## 2015-06-18 MED ORDER — ROCURONIUM BROMIDE 100 MG/10ML IV SOLN
INTRAVENOUS | Status: DC | PRN
  Administered 2015-06-18: 25 mg via INTRAVENOUS

## 2015-06-18 MED ORDER — METHOCARBAMOL 1000 MG/10ML IJ SOLN
500.0000 mg | Freq: Four times a day (QID) | INTRAVENOUS | Status: DC | PRN
  Filled 2015-06-18: qty 5

## 2015-06-18 MED ORDER — PHENYLEPHRINE HCL 10 MG/ML IJ SOLN
10.0000 mg | INTRAVENOUS | Status: DC | PRN
  Administered 2015-06-18: 10 ug/min via INTRAVENOUS

## 2015-06-18 MED ORDER — METOCLOPRAMIDE HCL 5 MG PO TABS
5.0000 mg | ORAL_TABLET | Freq: Three times a day (TID) | ORAL | Status: DC | PRN
Start: 2015-06-18 — End: 2015-06-21

## 2015-06-18 MED ORDER — ONDANSETRON HCL 4 MG/2ML IJ SOLN
4.0000 mg | Freq: Four times a day (QID) | INTRAMUSCULAR | Status: DC | PRN
  Administered 2015-06-20: 4 mg via INTRAVENOUS
  Filled 2015-06-18: qty 2

## 2015-06-18 MED ORDER — FENTANYL CITRATE (PF) 250 MCG/5ML IJ SOLN
INTRAMUSCULAR | Status: AC
  Filled 2015-06-18: qty 5

## 2015-06-18 MED ORDER — PANTOPRAZOLE SODIUM 40 MG PO TBEC
40.0000 mg | DELAYED_RELEASE_TABLET | Freq: Every day | ORAL | Status: DC
  Administered 2015-06-19 – 2015-06-21 (×3): 40 mg via ORAL
  Filled 2015-06-18 (×3): qty 1

## 2015-06-18 MED ORDER — HYDROMORPHONE HCL 1 MG/ML IJ SOLN
INTRAMUSCULAR | Status: AC
  Filled 2015-06-18: qty 1

## 2015-06-18 MED ORDER — ALUM & MAG HYDROXIDE-SIMETH 200-200-20 MG/5ML PO SUSP: 30.0000 mL | ORAL | Status: DC | PRN

## 2015-06-18 MED ORDER — ONDANSETRON HCL 4 MG PO TABS: 4.0000 mg | ORAL_TABLET | Freq: Four times a day (QID) | ORAL | Status: DC | PRN

## 2015-06-18 MED ORDER — LORAZEPAM 1 MG PO TABS
1.0000 mg | ORAL_TABLET | Freq: Two times a day (BID) | ORAL | Status: DC | PRN
  Administered 2015-06-20: 1 mg via ORAL
  Filled 2015-06-18: qty 1

## 2015-06-18 MED ORDER — DEXAMETHASONE SODIUM PHOSPHATE 10 MG/ML IJ SOLN
INTRAMUSCULAR | Status: DC | PRN
  Administered 2015-06-18: 10 mg via INTRAVENOUS

## 2015-06-18 MED ORDER — NEOSTIGMINE METHYLSULFATE 10 MG/10ML IV SOLN
INTRAVENOUS | Status: AC
  Filled 2015-06-18: qty 1

## 2015-06-18 MED ORDER — HYDROMORPHONE HCL 1 MG/ML IJ SOLN
1.0000 mg | INTRAMUSCULAR | Status: DC | PRN
  Administered 2015-06-18 – 2015-06-19 (×6): 1 mg via INTRAVENOUS
  Filled 2015-06-18 (×6): qty 1

## 2015-06-18 MED ORDER — MENTHOL 3 MG MT LOZG: 1.0000 | LOZENGE | OROMUCOSAL | Status: DC | PRN

## 2015-06-18 MED ORDER — ACETAMINOPHEN 650 MG RE SUPP: 650.0000 mg | Freq: Four times a day (QID) | RECTAL | Status: DC | PRN

## 2015-06-18 MED ORDER — LIDOCAINE HCL (CARDIAC) 20 MG/ML IV SOLN
INTRAVENOUS | Status: DC | PRN
  Administered 2015-06-18: 50 mg via INTRAVENOUS

## 2015-06-18 MED ORDER — DIPHENHYDRAMINE HCL 12.5 MG/5ML PO ELIX
12.5000 mg | ORAL_SOLUTION | ORAL | Status: DC | PRN
Start: 2015-06-18 — End: 2015-06-21

## 2015-06-18 MED ORDER — METHOCARBAMOL 500 MG PO TABS
500.0000 mg | ORAL_TABLET | Freq: Four times a day (QID) | ORAL | Status: DC | PRN
Start: 2015-06-18 — End: 2015-06-21
  Administered 2015-06-18 – 2015-06-21 (×9): 500 mg via ORAL
  Filled 2015-06-18 (×11): qty 1

## 2015-06-18 MED ORDER — FAMOTIDINE 20 MG PO TABS
20.0000 mg | ORAL_TABLET | Freq: Two times a day (BID) | ORAL | Status: DC
  Administered 2015-06-18 – 2015-06-21 (×6): 20 mg via ORAL
  Filled 2015-06-18 (×6): qty 1

## 2015-06-18 MED ORDER — LIDOCAINE HCL (CARDIAC) 20 MG/ML IV SOLN
INTRAVENOUS | Status: AC
  Filled 2015-06-18: qty 5

## 2015-06-18 MED ORDER — ONDANSETRON HCL 4 MG/2ML IJ SOLN
INTRAMUSCULAR | Status: DC | PRN
  Administered 2015-06-18: 4 mg via INTRAVENOUS

## 2015-06-18 MED ORDER — SODIUM CHLORIDE 0.9 % IR SOLN
Status: DC | PRN
  Administered 2015-06-18: 3000 mL

## 2015-06-18 MED ORDER — GLYCOPYRROLATE 0.2 MG/ML IJ SOLN
INTRAMUSCULAR | Status: DC | PRN
  Administered 2015-06-18: 0.6 mg via INTRAVENOUS

## 2015-06-18 MED ORDER — SODIUM CHLORIDE 0.9 % IV SOLN
INTRAVENOUS | Status: DC
  Administered 2015-06-19: 04:00:00 via INTRAVENOUS

## 2015-06-18 MED ORDER — MIDAZOLAM HCL 2 MG/2ML IJ SOLN
INTRAMUSCULAR | Status: AC
  Filled 2015-06-18: qty 2

## 2015-06-18 MED ORDER — LACTATED RINGERS IV SOLN
INTRAVENOUS | Status: DC
  Administered 2015-06-18 (×2): via INTRAVENOUS

## 2015-06-18 MED ORDER — NEOSTIGMINE METHYLSULFATE 10 MG/10ML IV SOLN
INTRAVENOUS | Status: DC | PRN
  Administered 2015-06-18: 5 mg via INTRAVENOUS

## 2015-06-18 MED ORDER — OXYCODONE HCL 5 MG PO TABS
5.0000 mg | ORAL_TABLET | ORAL | Status: DC | PRN
  Administered 2015-06-18 – 2015-06-19 (×6): 10 mg via ORAL
  Filled 2015-06-18 (×5): qty 2

## 2015-06-18 MED ORDER — FENTANYL CITRATE (PF) 100 MCG/2ML IJ SOLN
INTRAMUSCULAR | Status: DC | PRN
  Administered 2015-06-18: 25 ug via INTRAVENOUS
  Administered 2015-06-18: 50 ug via INTRAVENOUS
  Administered 2015-06-18: 25 ug via INTRAVENOUS
  Administered 2015-06-18: 50 ug via INTRAVENOUS
  Administered 2015-06-18: 100 ug via INTRAVENOUS
  Administered 2015-06-18: 50 ug via INTRAVENOUS
  Administered 2015-06-18: 25 ug via INTRAVENOUS
  Administered 2015-06-18: 50 ug via INTRAVENOUS
  Administered 2015-06-18: 100 ug via INTRAVENOUS
  Administered 2015-06-18: 25 ug via INTRAVENOUS

## 2015-06-18 MED ORDER — HYDROXYCHLOROQUINE SULFATE 200 MG PO TABS
200.0000 mg | ORAL_TABLET | Freq: Every day | ORAL | Status: DC
  Administered 2015-06-19 – 2015-06-21 (×3): 200 mg via ORAL
  Filled 2015-06-18 (×3): qty 1

## 2015-06-18 MED ORDER — 0.9 % SODIUM CHLORIDE (POUR BTL) OPTIME
TOPICAL | Status: DC | PRN
  Administered 2015-06-18: 1000 mL

## 2015-06-18 MED ORDER — ALBUTEROL SULFATE (2.5 MG/3ML) 0.083% IN NEBU: 3.0000 mL | INHALATION_SOLUTION | Freq: Four times a day (QID) | RESPIRATORY_TRACT | Status: DC | PRN

## 2015-06-18 MED ORDER — DOCUSATE SODIUM 100 MG PO CAPS
100.0000 mg | ORAL_CAPSULE | Freq: Two times a day (BID) | ORAL | Status: DC
  Administered 2015-06-18 – 2015-06-21 (×6): 100 mg via ORAL
  Filled 2015-06-18 (×6): qty 1

## 2015-06-18 MED ORDER — SUCCINYLCHOLINE CHLORIDE 20 MG/ML IJ SOLN
INTRAMUSCULAR | Status: DC | PRN
  Administered 2015-06-18: 100 mg via INTRAVENOUS

## 2015-06-18 MED ORDER — METOCLOPRAMIDE HCL 5 MG/ML IJ SOLN: 5.0000 mg | Freq: Three times a day (TID) | INTRAMUSCULAR | Status: DC | PRN

## 2015-06-18 MED ORDER — MIDAZOLAM HCL 5 MG/5ML IJ SOLN
INTRAMUSCULAR | Status: DC | PRN
  Administered 2015-06-18 (×2): 2 mg via INTRAVENOUS

## 2015-06-18 MED ORDER — HYDROMORPHONE HCL 1 MG/ML IJ SOLN
0.2500 mg | INTRAMUSCULAR | Status: DC | PRN
  Administered 2015-06-18 (×2): 0.5 mg via INTRAVENOUS

## 2015-06-18 MED ORDER — PROMETHAZINE HCL 25 MG/ML IJ SOLN: 6.2500 mg | INTRAMUSCULAR | Status: DC | PRN

## 2015-06-18 MED ORDER — CEFAZOLIN SODIUM 1-5 GM-% IV SOLN
1.0000 g | Freq: Four times a day (QID) | INTRAVENOUS | Status: AC
  Administered 2015-06-18 – 2015-06-19 (×2): 1 g via INTRAVENOUS
  Filled 2015-06-18 (×2): qty 50

## 2015-06-18 MED ORDER — OXYCODONE HCL 5 MG PO TABS
ORAL_TABLET | ORAL | Status: AC
  Filled 2015-06-18: qty 2

## 2015-06-18 MED ORDER — PHENYLEPHRINE 40 MCG/ML (10ML) SYRINGE FOR IV PUSH (FOR BLOOD PRESSURE SUPPORT)
PREFILLED_SYRINGE | INTRAVENOUS | Status: AC
  Filled 2015-06-18: qty 10

## 2015-06-18 SURGICAL SUPPLY — 64 items
BANDAGE ELASTIC 6 VELCRO ST LF (GAUZE/BANDAGES/DRESSINGS) ×2 IMPLANT
BANDAGE ESMARK 6X9 LF (GAUZE/BANDAGES/DRESSINGS) ×1 IMPLANT
BLADE SAG 18X100X1.27 (BLADE) ×2 IMPLANT
BNDG ESMARK 6X9 LF (GAUZE/BANDAGES/DRESSINGS) ×2
BOWL SMART MIX CTS (DISPOSABLE) ×2 IMPLANT
CAPT KNEE TOTAL 3 ×2 IMPLANT
CEMENT BONE SIMPLEX SPEEDSET (Cement) ×4 IMPLANT
COVER SURGICAL LIGHT HANDLE (MISCELLANEOUS) ×2 IMPLANT
CUFF TOURNIQUET SINGLE 34IN LL (TOURNIQUET CUFF) ×2 IMPLANT
CUFF TOURNIQUET SINGLE 44IN (TOURNIQUET CUFF) IMPLANT
DRAPE INCISE IOBAN 66X45 STRL (DRAPES) IMPLANT
DRAPE ORTHO SPLIT 77X108 STRL (DRAPES) ×2
DRAPE PROXIMA HALF (DRAPES) IMPLANT
DRAPE SURG ORHT 6 SPLT 77X108 (DRAPES) ×2 IMPLANT
DRAPE U-SHAPE 47X51 STRL (DRAPES) ×2 IMPLANT
DRSG PAD ABDOMINAL 8X10 ST (GAUZE/BANDAGES/DRESSINGS) ×4 IMPLANT
DURAPREP 26ML APPLICATOR (WOUND CARE) ×2 IMPLANT
ELECT CAUTERY BLADE 6.4 (BLADE) ×2 IMPLANT
ELECT REM PT RETURN 9FT ADLT (ELECTROSURGICAL) ×2
ELECTRODE REM PT RTRN 9FT ADLT (ELECTROSURGICAL) ×1 IMPLANT
EVACUATOR 1/8 PVC DRAIN (DRAIN) ×2 IMPLANT
FACESHIELD WRAPAROUND (MASK) ×4 IMPLANT
GAUZE SPONGE 4X4 12PLY STRL (GAUZE/BANDAGES/DRESSINGS) ×2 IMPLANT
GAUZE XEROFORM 1X8 LF (GAUZE/BANDAGES/DRESSINGS) ×2 IMPLANT
GLOVE BIOGEL PI IND STRL 8 (GLOVE) ×2 IMPLANT
GLOVE BIOGEL PI INDICATOR 8 (GLOVE) ×2
GLOVE ORTHO TXT STRL SZ7.5 (GLOVE) ×2 IMPLANT
GLOVE SURG ORTHO 8.0 STRL STRW (GLOVE) ×2 IMPLANT
GOWN STRL REUS W/ TWL LRG LVL3 (GOWN DISPOSABLE) ×2 IMPLANT
GOWN STRL REUS W/ TWL XL LVL3 (GOWN DISPOSABLE) ×2 IMPLANT
GOWN STRL REUS W/TWL LRG LVL3 (GOWN DISPOSABLE) ×2
GOWN STRL REUS W/TWL XL LVL3 (GOWN DISPOSABLE) ×2
HANDPIECE INTERPULSE COAX TIP (DISPOSABLE) ×1
IMMOBILIZER KNEE 22 UNIV (SOFTGOODS) ×2 IMPLANT
KIT BASIN OR (CUSTOM PROCEDURE TRAY) ×2 IMPLANT
KIT ROOM TURNOVER OR (KITS) ×2 IMPLANT
MANIFOLD NEPTUNE II (INSTRUMENTS) ×2 IMPLANT
NDL SAFETY ECLIPSE 18X1.5 (NEEDLE) IMPLANT
NEEDLE HYPO 18GX1.5 SHARP (NEEDLE)
NS IRRIG 1000ML POUR BTL (IV SOLUTION) ×2 IMPLANT
PACK TOTAL JOINT (CUSTOM PROCEDURE TRAY) ×2 IMPLANT
PACK UNIVERSAL I (CUSTOM PROCEDURE TRAY) ×2 IMPLANT
PAD ARMBOARD 7.5X6 YLW CONV (MISCELLANEOUS) ×4 IMPLANT
PADDING CAST COTTON 6X4 STRL (CAST SUPPLIES) ×2 IMPLANT
SET HNDPC FAN SPRY TIP SCT (DISPOSABLE) ×1 IMPLANT
SET PAD KNEE POSITIONER (MISCELLANEOUS) ×2 IMPLANT
SPONGE LAP 18X18 X RAY DECT (DISPOSABLE) ×2 IMPLANT
STAPLER VISISTAT 35W (STAPLE) ×2 IMPLANT
STRIP CLOSURE SKIN 1/2X4 (GAUZE/BANDAGES/DRESSINGS) IMPLANT
SUCTION FRAZIER TIP 10 FR DISP (SUCTIONS) ×4 IMPLANT
SUT MNCRL AB 4-0 PS2 18 (SUTURE) ×2 IMPLANT
SUT VIC AB 0 CT1 27 (SUTURE) ×1
SUT VIC AB 0 CT1 27XBRD ANBCTR (SUTURE) ×1 IMPLANT
SUT VIC AB 1 CT1 27 (SUTURE) ×3
SUT VIC AB 1 CT1 27XBRD ANBCTR (SUTURE) ×3 IMPLANT
SUT VIC AB 2-0 CT1 27 (SUTURE) ×2
SUT VIC AB 2-0 CT1 TAPERPNT 27 (SUTURE) ×2 IMPLANT
SYR 50ML LL SCALE MARK (SYRINGE) IMPLANT
TOWEL OR 17X24 6PK STRL BLUE (TOWEL DISPOSABLE) ×2 IMPLANT
TOWEL OR 17X26 10 PK STRL BLUE (TOWEL DISPOSABLE) ×2 IMPLANT
TRAY CATH 16FR W/PLASTIC CATH (SET/KITS/TRAYS/PACK) IMPLANT
TRAY FOLEY CATH 16FRSI W/METER (SET/KITS/TRAYS/PACK) ×2 IMPLANT
WATER STERILE IRR 1000ML POUR (IV SOLUTION) IMPLANT
WRAP KNEE MAXI GEL POST OP (GAUZE/BANDAGES/DRESSINGS) ×2 IMPLANT

## 2015-06-18 NOTE — Brief Op Note (Signed)
06/18/2015  4:08 PM  PATIENT:  Kathy [name]  49 y.o. female  PRE-OPERATIVE DIAGNOSIS:  Severe rheumatoid arthritis, degenerative joint disease right knee  POST-OPERATIVE DIAGNOSIS:  Severe rheumatoid arthritis, degenerative joint disease right   PROCEDURE:  Procedure(s): RIGHT TOTAL KNEE ARTHROPLASTY (Right)  SURGEON:  Surgeon(s) and Role:    * Kathryne Hitch, MD - Primary  PHYSICIAN ASSISTANT: Rexene Edison, PA-c  ANESTHESIA:   regional and general  EBL:  Total I/O In: -  Out: 300 [Urine:100; Blood:200]  BLOOD ADMINISTERED:none  DRAINS: none   LOCAL MEDICATIONS USED:  NONE  SPECIMEN:  No Specimen  DISPOSITION OF SPECIMEN:  N/A  COUNTS:  YES  TOURNIQUET:   Total Tourniquet Time Documented: Thigh (Right) - 62 minutes Total: Thigh (Right) - 62 minutes   DICTATION: .Other Dictation: Dictation Number [id number]  PLAN OF CARE: Admit to inpatient   PATIENT DISPOSITION:  PACU - hemodynamically stable.   Delay start of Pharmacological VTE agent (>24hrs) due to surgical blood loss or risk of bleeding: no

## 2015-06-18 NOTE — H&P (Signed)
TOTAL KNEE ADMISSION H&P  Patient is being admitted for right total knee arthroplasty.  Subjective:  Chief Complaint:right knee pain.  HPI: Kathy [name], 49 y.o. female, has a history of pain and functional disability in the right knee due to arthritis and has failed non-surgical conservative treatments for greater than 12 weeks to includeNSAID's and/or analgesics, corticosteriod injections, viscosupplementation injections, flexibility and strengthening excercises, supervised PT with diminished ADL's post treatment, use of assistive devices, weight reduction as appropriate and activity modification.  Onset of symptoms was gradual, starting >10 years ago with gradually worsening course since that time. The patient noted no past surgery on the right knee(s).  Patient currently rates pain in the right knee(s) at 10 out of 10 with activity. Patient has night pain, worsening of pain with activity and weight bearing, pain that interferes with activities of daily living, pain with passive range of motion, crepitus and joint swelling.  Patient has evidence of subchondral cysts, subchondral sclerosis, periarticular osteophytes and joint space narrowing by imaging studies. There is no active infection.  Patient Active Problem List   Diagnosis Date Noted  . Rheumatoid arthritis of right knee 06/18/2015  . Tachycardia 04/04/2015  . 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 Tingling 03/15/2015  . Malnutrition of moderate degree 09/20/2014  . Rheumatoid arthritis flare 09/18/2014  . Hypokalemia 09/18/2014  . Loss of weight 09/18/2014  . Anorexia 09/18/2014  . Anemia 09/18/2014  . TROCHANTERIC BURSITIS, RIGHT 05/20/2010  . VITAMIN D DEFICIENCY 02/16/2010  . COUGH 02/04/2010  . RHEUMATOID ARTHRITIS 09/15/2009  . DEGENERATIVE JOINT DISEASE, GENERALIZED 07/28/2007   Past Medical History  Diagnosis Date  . Rheumatoid arteritis   . Asthma     seasonal  . GERD (gastroesophageal reflux  disease)     Past Surgical History  Procedure Laterality Date  . Abdominal hysterectomy      No prescriptions prior to admission   Allergies  Allergen Reactions  . Methotrexate Derivatives Shortness Of Breath and Nausea And Vomiting  . Peanut-Containing Drug Products Anaphylaxis  . Aspirin Nausea And Vomiting  . Tomato Rash    Social History  Substance Use Topics  . Smoking status: Never Smoker   . Smokeless tobacco: Not on file  . Alcohol Use: No    Family History  Problem Relation Age of Onset  . Asthma Mother   . Diabetes Mother   . Hypertension Mother   . Heart disease Mother   . Asthma Father   . Cancer Father     lung  . Cancer Sister     breast at 56  . Cancer Brother     unknown     Review of Systems  Musculoskeletal: Positive for joint pain.  All other systems reviewed and are negative.   Objective:  Physical Exam  Constitutional: She is oriented to person, place, and time. She appears well-developed and well-nourished.  HENT:  Head: Normocephalic and atraumatic.  Eyes: Pupils are equal, round, and reactive to light.  Neck: Normal range of motion. Neck supple.  Cardiovascular: Normal rate.   Respiratory: Effort normal and breath sounds normal.  GI: Soft. Bowel sounds are normal.  Musculoskeletal:       Right knee: She exhibits decreased range of motion, swelling, effusion and abnormal alignment. Tenderness found. Medial joint line and lateral joint line tenderness noted.  Neurological: She is alert and oriented to person, place, and time.  Skin: Skin is warm and dry.  Psychiatric: She has a normal mood and affect.  Vital signs in last 24 hours:    Labs:   Estimated body mass index is 27.86 kg/(m^2) as calculated from the following:   Height as of 04/14/15: 5\' 2"  (1.575 m).   Weight as of 04/14/15: 69.117 kg (152 lb 6 oz).   Imaging Review Plain radiographs demonstrate severe degenerative joint disease of the right knee(s). The overall  alignment ismild valgus. The bone quality appears to be good for age and reported activity level.  Assessment/Plan:  End stage arthritis, right knee   The patient history, physical examination, clinical judgment of the provider and imaging studies are consistent with end stage degenerative joint disease of the right knee(s) and total knee arthroplasty is deemed medically necessary. The treatment options including medical management, injection therapy arthroscopy and arthroplasty were discussed at length. The risks and benefits of total knee arthroplasty were presented and reviewed. The risks due to aseptic loosening, infection, stiffness, patella tracking problems, thromboembolic complications and other imponderables were discussed. The patient acknowledged the explanation, agreed to proceed with the plan and consent was signed. Patient is being admitted for inpatient treatment for surgery, pain control, PT, OT, prophylactic antibiotics, VTE prophylaxis, progressive ambulation and ADL's and discharge planning. The patient is planning to be discharged home with home health services

## 2015-06-18 NOTE — Transfer of Care (Signed)
Immediate Anesthesia Transfer of Care Note  Patient: Kathy [name]  Procedure(s) Performed: Procedure(s): RIGHT TOTAL KNEE ARTHROPLASTY (Right)  Patient Location: PACU  Anesthesia Type:GA combined with regional for post-op pain  Level of Consciousness: awake, alert , oriented, pateint uncooperative and responds to stimulation  Airway & Oxygen Therapy: Patient Spontanous Breathing and Patient connected to nasal cannula oxygen  Post-op Assessment: Report given to RN, Post -op Vital signs reviewed and stable, Patient moving all extremities X 4 and Patient able to stick tongue midline  Post vital signs: stable  Last Vitals:  Filed Vitals:   06/18/15 1415  BP: 144/71  Pulse: 105  Temp:   Resp: 23    Complications: No apparent anesthesia complications

## 2015-06-18 NOTE — Progress Notes (Signed)
CRNA at bedside preparing to take pt back to OR. 

## 2015-06-18 NOTE — Anesthesia Preprocedure Evaluation (Signed)
Anesthesia Evaluation  Patient identified by MRN, date of birth, ID band Patient awake  General Assessment Comment:On prednisone  History of Anesthesia Complications Negative for: history of anesthetic complications  Airway Mallampati: II       Dental  (+) Teeth Intact   Pulmonary asthma ,  breath sounds clear to auscultation        Cardiovascular Rhythm:Regular Rate:Tachycardia     Neuro/Psych    GI/Hepatic GERD-  ,  Endo/Other    Renal/GU      Musculoskeletal  (+) Arthritis -, Rheumatoid disorders,    Abdominal   Peds  Hematology  (+) anemia ,   Anesthesia Other Findings   Reproductive/Obstetrics                             Anesthesia Physical Anesthesia Plan  ASA: III  Anesthesia Plan: General   Post-op Pain Management:    Induction: Intravenous  Airway Management Planned: Oral ETT  Additional Equipment:   Intra-op Plan:   Post-operative Plan: Extubation in OR  Informed Consent: I have reviewed the patients History and Physical, chart, labs and discussed the procedure including the risks, benefits and alternatives for the proposed anesthesia with the patient or authorized representative who has indicated his/her understanding and acceptance.   Dental advisory given  Plan Discussed with: CRNA and Surgeon  Anesthesia Plan Comments:         Anesthesia Quick Evaluation

## 2015-06-18 NOTE — Progress Notes (Signed)
Orthopedic Tech Progress Note Patient Details:  Kathy [name] 1966/10/15 [id number]  CPM Right Knee CPM Right Knee: On Right Knee Flexion (Degrees): 90 Right Knee Extension (Degrees): 0 Additional Comments: applied ohf to bed   [name] 06/18/2015, 6:02 PM

## 2015-06-18 NOTE — Anesthesia Procedure Notes (Addendum)
Anesthesia Regional Block:  Femoral nerve block  Pre-Anesthetic Checklist: ,, timeout performed, Correct Patient, Correct Site, Correct Laterality, Correct Procedure, Correct Position, site marked, Risks and benefits discussed,  Surgical consent,  Pre-op evaluation,  At surgeon's request and post-op pain management  Laterality: Right and Lower  Prep: chloraprep       Needles:  Injection technique: Single-shot  Needle Type: Stimulator Needle - 80     Needle Length: 9cm 9 cm Needle Gauge: 22 and 22 G  Needle insertion depth: 6 cm   Additional Needles:  Procedures: ultrasound guided (picture in chart) and nerve stimulator Femoral nerve block  Nerve Stimulator or Paresthesia:  Response: Twitch elicited, 0.8 mA,   Additional Responses:   Narrative:  Start time: 06/18/2015 1:50 PM End time: 06/18/2015 2:05 PM Injection made incrementally with aspirations every 5 mL.  Performed by: Personally  Anesthesiologist: MASSAGEE, TERRY  Additional Notes: BP cuff, EKG monitors applied. Sedation begun. Femoral artery palpated for location of nerve. After nerve location anesthetic injected incrementally, slowly , and after neg aspirations. Tolerated well.   Procedure Name: Intubation Date/Time: 06/18/2015 2:33 PM Performed by: Marylyn Ishihara Pre-anesthesia Checklist: Patient identified, Emergency Drugs available, Suction available, Patient being monitored and Timeout performed Patient Re-evaluated:Patient Re-evaluated prior to inductionOxygen Delivery Method: Circle system utilized Preoxygenation: Pre-oxygenation with 100% oxygen Intubation Type: IV induction Ventilation: Mask ventilation without difficulty Laryngoscope Size: Mac and 3 Grade View: Grade I Tube type: Oral Tube size: 7.0 mm Number of attempts: 1 Airway Equipment and Method: Stylet Placement Confirmation: ETT inserted through vocal cords under direct vision,  positive ETCO2 and breath sounds checked- equal and  bilateral Secured at: 22 cm Dental Injury: Teeth and Oropharynx as per pre-operative assessment

## 2015-06-19 ENCOUNTER — Encounter (HOSPITAL_COMMUNITY): Payer: Self-pay | Admitting: Orthopaedic Surgery

## 2015-06-19 LAB — CBC
HCT: 26 % — ABNORMAL LOW (ref 36.0–46.0)
HEMOGLOBIN: 8.4 g/dL — AB (ref 12.0–15.0)
MCH: 29 pg (ref 26.0–34.0)
MCHC: 32.3 g/dL (ref 30.0–36.0)
MCV: 89.7 fL (ref 78.0–100.0)
PLATELETS: 232 10*3/uL (ref 150–400)
RBC: 2.9 MIL/uL — AB (ref 3.87–5.11)
RDW: 13.3 % (ref 11.5–15.5)
WBC: 8.7 10*3/uL (ref 4.0–10.5)

## 2015-06-19 LAB — BASIC METABOLIC PANEL
Anion gap: 8 (ref 5–15)
BUN: 7 mg/dL (ref 6–20)
CALCIUM: 8.1 mg/dL — AB (ref 8.9–10.3)
CO2: 26 mmol/L (ref 22–32)
CREATININE: 0.65 mg/dL (ref 0.44–1.00)
Chloride: 95 mmol/L — ABNORMAL LOW (ref 101–111)
GFR calc Af Amer: >60 mL/min (ref >60)
GFR calc non Af Amer: >60 mL/min (ref >60)
GLUCOSE: 111 mg/dL — AB (ref 65–99)
Potassium: 4 mmol/L (ref 3.5–5.1)
SODIUM: 129 mmol/L — AB (ref 135–145)

## 2015-06-19 MED ORDER — OXYCODONE HCL 5 MG PO TABS
5.0000 mg | ORAL_TABLET | ORAL | Status: DC | PRN
  Administered 2015-06-19 – 2015-06-20 (×6): 15 mg via ORAL
  Administered 2015-06-20: 10 mg via ORAL
  Administered 2015-06-20: 15 mg via ORAL
  Administered 2015-06-21 (×4): 10 mg via ORAL
  Filled 2015-06-19 (×5): qty 3
  Filled 2015-06-19: qty 2
  Filled 2015-06-19: qty 3
  Filled 2015-06-19 (×2): qty 2
  Filled 2015-06-19: qty 3
  Filled 2015-06-19: qty 2
  Filled 2015-06-19: qty 3

## 2015-06-19 NOTE — Progress Notes (Signed)
Physical Therapy Treatment Patient Details Name: Kathy [name] MRN: [id number] DOB: 06-18-1966 Today's Date: 06/19/2015    History of Present Illness Rt TKA due to RA    PT Comments    Patient with slow mobilization with PT. Patient stood at edge of bed and took 2 steps but refused any further ambulation. Patient reporting that her leg is too swollen and painful to do any walking. Attempted bed exercises however patient again refusing any activity due to pain. Patient reports severe pain with light touch to quadriceps musculature when cued for quadsets. Unclear D/C recommendations at this time due to lack of mobilization today.   Follow Up Recommendations  Home health PT     Equipment Recommendations  Rolling walker with 5" wheels    Recommendations for Other Services       Precautions / Restrictions Precautions Precautions: Knee;Fall Precaution Booklet Issued: No Restrictions Weight Bearing Restrictions: Yes RLE Weight Bearing: Weight bearing as tolerated    Mobility  Bed Mobility Overal bed mobility: Needs Assistance Bed Mobility: Sit to Supine;Supine to Sit     Supine to sit: Supervision Sit to supine: Supervision   General bed mobility comments: patient moving in bed without assisance, using rails to pull self up in bed.   Transfers Overall transfer level: Needs assistance Equipment used: Rolling walker (2 wheeled) Transfers: Sit to/from Stand Sit to Stand: Min assist         General transfer comment: cues needed for hand placement  Ambulation/Gait Ambulation/Gait assistance: Min assist Ambulation Distance (Feet): 2 Feet Assistive device: Rolling walker (2 wheeled) Gait Pattern/deviations: Step-to pattern;Decreased step length - right;Decreased stance time - right;Decreased weight shift to right;Antalgic Gait velocity: decreased   General Gait Details: patient leaning onto walker, using forearms on rw to attempt steps, patient stating the her "knee is  too swollen to try walking".    Stairs            Wheelchair Mobility    Modified Rankin (Stroke Patients Only)       Balance Overall balance assessment: Needs assistance Sitting-balance support: No upper extremity supported Sitting balance-Leahy Scale: Fair     Standing balance support: Bilateral upper extremity supported Standing balance-Leahy Scale: Poor                      Cognition Arousal/Alertness: Awake/alert Behavior During Therapy: WFL for tasks assessed/performed Overall Cognitive Status: Within Functional Limits for tasks assessed                      Exercises Total Joint Exercises Ankle Circles/Pumps: AROM;Both;15 reps Quad Sets: Strengthening;Right;10 reps Gluteal Sets: Strengthening;Both;10 reps Heel Slides: AAROM;Right;10 reps Hip ABduction/ADduction: Strengthening;Right;10 reps    General Comments General comments (skin integrity, edema, etc.): Patient asking for pillow under her knee, explained and reinforced reason for no pillows and need for knee extension.       Pertinent Vitals/Pain Pain Assessment: 0-10 Pain Score: 9  Pain Location: Rt LE Pain Descriptors / Indicators: Other (Comment);Tightness (pain) Pain Intervention(s): Limited activity within patient's tolerance;Repositioned;RN gave pain meds during session    Home Living Family/patient expects to be discharged to:: Private residence Living Arrangements: Other relatives (sister and family) Available Help at Discharge: Family;Available 24 hours/day Type of Home: House Home Access: Stairs to enter Entrance Stairs-Rails: None Home Layout: One level Home Equipment: None Additional Comments: Reports planning to go home to sister's home upon D/C    Prior Function Level of Independence:  Independent          PT Goals (current goals can now be found in the care plan section) Acute Rehab PT Goals Patient Stated Goal: not expressed PT Goal Formulation: With  patient Time For Goal Achievement: 07/03/15 Potential to Achieve Goals: Fair Progress towards PT goals: Not progressing toward goals - comment (limited participation in PT due to reports of pain)    Frequency  7X/week    PT Plan Other (comment) (unclear D/C plan with limited mobilization)    Co-evaluation             End of Session Equipment Utilized During Treatment: Gait belt;Right knee immobilizer Activity Tolerance: Patient limited by pain Patient left: in bed;with call bell/phone within reach;Other (comment) (cold pack applied per patient request)     Time: 1025-8527 PT Time Calculation (min) (ACUTE ONLY): 16 min  Charges:  $Gait Training: 8-22 mins $Therapeutic Activity: 8-22 mins                    G Codes:      Christiane Ha, PT, CSCS Pager [phone] Office [phone]  06/19/2015, 5:36 PM

## 2015-06-19 NOTE — Op Note (Signed)
NAMEMarland Kitchen  CLOE, SOCKWELL NO.:  [id number]  MEDICAL RECORD NO.:  [id number]  LOCATION:  5N30C                        FACILITY:  [facility]  PHYSICIAN:  Vanita Panda. 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  PROCEDURE:  Right total knee arthroplasty.  IMPLANTS:  Stryker Triathlon knee with size 3 femur, size 4 tibial tray, 9-mm polyethylene insert, size 29 patellar button.  SURGEON:  Vanita Panda. Magnus Ivan, M.D.  ASSISTANT:  Richardean Canal, PA-C.  ANESTHESIA: 1. General. 2. Right leg femoral nerve block.  TOURNIQUET TIME:  Just over 1 hour.  BLOOD LOSS:  200 mL.  ANTIBIOTICS:  2 g IV Ancef.  COMPLICATIONS:  None.  INDICATIONS:  [name] is a 49 year old female with severe debilitating rheumatoid arthritis involving almost every joint of her body, her right and left knees have been quite painful to her.  The right knee has significant valgus deformity and x-rays just show complete loss of the joint space throughout her knee.  She has a flexion contracture of that knee too, and she cannot flex much past 90 degrees. At this point, she does wish to proceed with a total knee arthroplasty. She understands the risks of acute blood loss anemia, nerve and vessel injury, fracture, infection, or DVT.  She understands the goals are decreased pain, improved mobility, and overall improved quality of life.  DESCRIPTION OF PROCEDURE:  After informed consent was obtained, appropriate right leg was marked.  She was brought to the operating room and a femoral nerve block was obtained and general anesthesia  was obtained.  A nonsterile tourniquet was placed on her upper right leg and the right leg was prepped and draped from the thigh down the toes with DuraPrep and sterile drapes including sterile stockinette.  Time-out was called, and she was identified as correct patient, correct right knee. I then made incision over the patella and carried this proximally and distally.  We dissected down the knee joint and carried out a medial parapatellar arthrotomy, rarely significant tissue throughout the knee, finding a large joint effusion, removing a lot of synovium from the knee from her severe rheumatoid disease.  We removed the ACL, PCL, medial, and lateral meniscus.  We then used our extramedullary cutting guide for the tibia, taking 9 mm off the high side, correcting her varus, valgus, and negative slope, and then we decided to take 2 more mm to the knee __________ incredibly tight.  We then went to the femur.  We used the intramedullary guide __________ distal femoral cut setting __________ distal femoral cutting guide at 5 degrees externally rotated and a 10-mm cut.  We then made that cut and took extra 2 mm off this again due to the severity of her deformity, we could then bring the knee back down to extension with a 9-mm extension block to achieve full extension and corrected her valgus.  We then went back to our femoral sizing guide and chose a size 3 femur based on the epicondylar axis.  We made a 4-in-1 box cut and chamfer cuts followed by femoral box cut.  We then went to the tibia and trialed a size 4 tibia and a made a keel punch off this with a trial of 4 tibia and the trial 3 femur.  We trialed a 9-mm polyethylene insert, and we were pleased with the range of motion, stability, and alignment.  We then made our patellar cut and drilled 3 holes for size 29 patellar button.  With all trial components in place again, we were pleased with the stability and range of motion.  We  then removed all trial components and irrigated the knee with normal saline solution using pulsatile lavage with extra cement and then cemented the real Stryker Triathlon tibial tray followed by the size 4, followed by the real size 3 femur.  I placed the real 9-mm fix bearing polyethylene insert and cemented our patellar button.  Once the cement had hardened, we removed cement debris from the knee and let the tourniquet down.  We irrigated the knee again with normal saline solution using pulsatile lavage and then hemostasis was obtained with electrocautery.  We then closed the arthrotomy with interrupted #1 Vicryl suture, followed by 0 Vicryl in the deep tissue, 2 Vicryl in subcutaneous tissue, and staples on the skin.  A well-padded sterile dressing was applied.  She was awakened, extubated, and taken to the recovery room in stable condition. All final counts were correct.  No complications noted.  Of note, Richardean Canal, PA-C, assisted in the entire case.  His assistance was crucial for facilitating all aspects of this case.     Vanita Panda. Magnus Ivan, M.D.     CYB/MEDQ  D:  06/18/2015  T:  06/19/2015  Job:  [id number]

## 2015-06-19 NOTE — Progress Notes (Signed)
Occupational Therapy Evaluation Patient Details Name: Kathy Kathy [name] MRN: [id number] DOB: [birth year] Today's Date: 06/19/2015    History of Present Illness Rt TKA due to RA   Clinical Impression   Patient presenting with decreased I in self care, decreased functional mobility/transfers, decreased safety. Patient reports being I PTA. Patient currently functioning at set up A for UB self care and max A for LB self care. Patient will benefit from acute OT to increase overall independence in the areas of ADLs, functional mobility, safety, and education in order to safely discharge home. Pt upset upon therapist entering the room and received pain medication as therapist entered the room. Pt refused sit <>stand and functional transfers this session. OT assisted from sit >supine back into bed.     Follow Up Recommendations  Home health OT    Equipment Recommendations  3 in 1 bedside comode;Tub/shower bench    Recommendations for Other Services  (none at this time)     Precautions / Restrictions Precautions Precautions: Knee;Fall Precaution Booklet Issued: No Restrictions Weight Bearing Restrictions: Yes RLE Weight Bearing: Weight bearing as tolerated      Mobility Bed Mobility Overal bed mobility: Needs Assistance Bed Mobility: Sit to Supine       Sit to supine: Mod assist   General bed mobility comments: Assist for B LEs and min verbal cues for technique/hand placement  Transfers Overall transfer Kathy [name]: Needs assistance Equipment used: Rolling walker (2 wheeled) Transfers: Sit to/from Stand Sit to Stand: Mod assist         General transfer comment: Pt seated on EOB upon entering the room and refused sit <>stand and transfers secondary to pain Kathy [name]    Balance Overall balance assessment: Needs assistance Sitting-balance support: No upper extremity supported Sitting balance-Kathy Kathy [name]: Fair     Standing balance support: Bilateral upper extremity  supported Standing balance-Kathy Kathy [name]: Poor                              ADL Overall ADL's : Needs assistance/impaired         Upper Body Bathing: Set up   Lower Body Bathing: Set up   Upper Body Dressing : Maximal assistance   Lower Body Dressing: Maximal assistance;Sit to/from stand;Cueing for safety   Toilet Transfer: Stand-pivot;Minimal assistance;RW   Toileting- Clothing Manipulation and Hygiene: Moderate assistance;Sit to/from stand;Cueing for safety               Vision     Perception     Praxis      Pertinent Vitals/Pain Pain Assessment: 0-10 Pain Score: 9  Pain Location: R knee Pain Descriptors / Indicators: Aching;Sore Pain Intervention(s): Limited activity within patient's tolerance;Repositioned;RN gave pain meds during session     Hand Dominance Right   Extremity/Trunk Assessment Upper Extremity Assessment Upper Extremity Assessment: Overall WFL for tasks assessed   Lower Extremity Assessment Lower Extremity Assessment: Defer to PT evaluation RLE Deficits / Details: poor quad activation   Cervical / Trunk Assessment Cervical / Trunk Assessment: Normal   Communication Communication Communication: No difficulties   Cognition Arousal/Alertness: Awake/alert Behavior During Therapy: WFL for tasks assessed/performed Overall Cognitive Status: Within Functional Limits for tasks assessed                     General Comments       Exercises Exercises: Total Joint     Shoulder Instructions      Home Living  Family/patient expects to be discharged to:: Private residence Living Arrangements: Other relatives (sister and family) Available Help at Discharge: Family;Available 24 hours/day Type of Home: House Home Access: Stairs to enter [employer] of Steps: 3 Entrance Stairs-Rails: None Home Layout: One Kathy [name]     Bathroom Shower/Tub: Tub/shower unit;Curtain Shower/tub characteristics: Technical brewer: Standard Bathroom Accessibility: Yes How Accessible: Accessible via walker Home Equipment: None   Additional Comments: Reports planning to go home to sister's home upon D/C      Prior Functioning/Environment Kathy [name] of Independence: Independent             OT Diagnosis: Generalized weakness   OT Problem List: Decreased strength;Decreased activity tolerance;Decreased knowledge of use of DME or AE;Pain;Decreased safety awareness;Impaired balance (sitting and/or standing)   OT Treatment/Interventions: Self-care/ADL training;Energy conservation;Cognitive remediation/compensation;Balance training;Therapeutic exercise;DME and/or AE instruction;Therapeutic activities;Patient/family education    OT Goals(Current goals can be found in the care plan section) Acute Rehab OT Goals Patient Stated Goal: to go home OT Goal Formulation: With patient Time For Goal Achievement: 07/03/15 Potential to Achieve Goals: Fair ADL Goals Pt Will Perform Upper Body Bathing: with modified independence Pt Will Perform Lower Body Bathing: with modified independence;sit to/from stand Pt Will Perform Upper Body Dressing: with modified independence Pt Will Perform Lower Body Dressing: with modified independence;sit to/from stand Pt Will Transfer to Toilet: with modified independence;bedside commode Pt Will Perform Toileting - Clothing Manipulation and hygiene: with modified independence;sit to/from stand Pt Will Perform Tub/Shower Transfer: Tub transfer;tub bench;rolling walker;ambulating  OT Frequency: Min 2X/week   Barriers to D/C: Other (comment)  none known at this time       Co-evaluation              End of Session    Activity Tolerance: Patient limited by pain;Patient limited by fatigue Patient left: in bed;with call bell/phone within reach;with nursing/sitter in room;with bed alarm set   Time: 2841-3244 OT Time Calculation (min): 13 min Charges:  OT General Charges $OT Visit: 1  Procedure OT Evaluation $Initial OT Evaluation Tier I: 1 Procedure  Kathy Grip, MS, OTR/L 06/19/2015, 4:58 PM

## 2015-06-19 NOTE — Anesthesia Postprocedure Evaluation (Signed)
  Anesthesia Post-op Note  Patient: Kathy [name]  Procedure(s) Performed: Procedure(s): RIGHT TOTAL KNEE ARTHROPLASTY (Right)  Patient Location: PACU  Anesthesia Type:General and block  Level of Consciousness: awake and alert   Airway and Oxygen Therapy: Patient Spontanous Breathing  Post-op Pain: Controlled  Post-op Assessment: Post-op Vital signs reviewed, Patient's Cardiovascular Status Stable and Respiratory Function Stable  Post-op Vital Signs: Reviewed  Filed Vitals:   06/19/15 0640  BP: 115/74  Pulse: 70  Temp: 36.8 C  Resp: 18    Complications: No apparent anesthesia complications

## 2015-06-19 NOTE — Progress Notes (Signed)
Subjective: 1 Day Post-Op Procedure(s) (LRB): RIGHT TOTAL KNEE ARTHROPLASTY (Right) Patient reports pain as moderate.  Acute blood loss anemia from surgery - will monitor for symptoms.  Objective: Vital signs in last 24 hours: Temp:  [98 F (36.7 C)-99.4 F (37.4 C)] 98.2 F (36.8 C) (08/24 0640) Pulse Rate:  [70-116] 70 (08/24 0640) Resp:  [18-23] 18 (08/24 0640) BP: (115-159)/(71-95) 115/74 mmHg (08/24 0640) SpO2:  [96 %-100 %] 100 % (08/24 0640) Weight:  [73.199 kg (161 lb 6 oz)] 73.199 kg (161 lb 6 oz) (08/23 1222)  Intake/Output from previous day: 08/23 0701 - 08/24 0700 In: 960 [P.O.:960] Out: 1400 [Urine:1200; Blood:200] Intake/Output this shift:     Recent Labs  06/19/15 0415  HGB 8.4*    Recent Labs  06/19/15 0415  WBC 8.7  RBC 2.90*  HCT 26.0*  PLT 232    Recent Labs  06/19/15 0415  NA 129*  K 4.0  CL 95*  CO2 26  BUN 7  CREATININE 0.65  GLUCOSE 111*  CALCIUM 8.1*   No results for input(s): LABPT, INR in the last 72 hours.  Sensation intact distally Intact pulses distally Dorsiflexion/Plantar flexion intact Incision: dressing C/D/I Compartment soft  Assessment/Plan: 1 Day Post-Op Procedure(s) (LRB): RIGHT TOTAL KNEE ARTHROPLASTY (Right) Up with therapy  Follow CBC  BLACKMAN,CHRISTOPHER Y 06/19/2015, 7:12 AM

## 2015-06-19 NOTE — Evaluation (Signed)
Physical Therapy Evaluation Patient Details Name: Kathy [name] MRN: [id number] DOB: Mar 08, 1966 Today's Date: 06/19/2015   History of Present Illness  Rt TKA due to RA  Clinical Impression  Pt is s/p TKA resulting in the deficits listed below (see PT Problem List). Pt will benefit from skilled PT to increase their independence and safety with mobility to allow discharge to the venue listed below. Patient reporting that she is planning to D/C to her sister's home and family assistance.      Follow Up Recommendations Home health PT    Equipment Recommendations  Rolling walker with 5" wheels    Recommendations for Other Services       Precautions / Restrictions Precautions Precautions: Knee;Fall Precaution Booklet Issued: No Restrictions Weight Bearing Restrictions: Yes RLE Weight Bearing: Weight bearing as tolerated      Mobility  Bed Mobility                  Transfers Overall transfer level: Needs assistance Equipment used: Rolling walker (2 wheeled) Transfers: Sit to/from Stand Sit to Stand: Mod assist         General transfer comment: found and returned to sitting. Cues for safe use of walker and hand placements  Ambulation/Gait Ambulation/Gait assistance: Min assist Ambulation Distance (Feet): 12 Feet Assistive device: Rolling walker (2 wheeled) Gait Pattern/deviations: Step-to pattern;Decreased step length - right;Decreased step length - left;Decreased stance time - right;Decreased weight shift to right Gait velocity: decreased   General Gait Details: cues for posture throughout and correct use of rw, patient bending forward to use forearms on rw.   Stairs            Wheelchair Mobility    Modified Rankin (Stroke Patients Only)       Balance Overall balance assessment: Needs assistance Sitting-balance support: No upper extremity supported Sitting balance-Leahy Scale: Fair     Standing balance support: Bilateral upper extremity  supported Standing balance-Leahy Scale: Poor                               Pertinent Vitals/Pain Pain Assessment: 0-10 Pain Score: 9  Pain Location: Rt knee Pain Descriptors / Indicators: Aching Pain Intervention(s): Limited activity within patient's tolerance;Monitored during session;Ice applied    Home Living Family/patient expects to be discharged to:: Private residence Living Arrangements: Other relatives (sister and her family) Available Help at Discharge: Family;Available 24 hours/day Type of Home: House Home Access: Stairs to enter Entrance Stairs-Rails: None Entrance Stairs-Number of Steps: 3 Home Layout: One level Home Equipment: None Additional Comments: Reports planning to go home to sister's home upon D/C    Prior Function Level of Independence: Independent               Hand Dominance        Extremity/Trunk Assessment               Lower Extremity Assessment: RLE deficits/detail RLE Deficits / Details: poor quad activation       Communication   Communication: No difficulties  Cognition Arousal/Alertness: Awake/alert Behavior During Therapy: WFL for tasks assessed/performed Overall Cognitive Status: Within Functional Limits for tasks assessed                      General Comments General comments (skin integrity, edema, etc.): Patient asking for pillow under her knee, explained and reinforced reason for no pillows and need for knee extension.  Exercises Total Joint Exercises Ankle Circles/Pumps: AROM;Both;15 reps Quad Sets: Strengthening;Right;10 reps Gluteal Sets: Strengthening;Both;10 reps Heel Slides: AAROM;Right;10 reps Hip ABduction/ADduction: Strengthening;Right;10 reps      Assessment/Plan    PT Assessment Patient needs continued PT services  PT Diagnosis Difficulty walking;Generalized weakness;Acute pain   PT Problem List Decreased strength;Decreased range of motion;Decreased activity  tolerance;Decreased balance;Decreased mobility;Decreased knowledge of use of DME;Pain  PT Treatment Interventions DME instruction;Gait training;Stair training;Therapeutic activities;Functional mobility training;Therapeutic exercise;Balance training;Patient/family education   PT Goals (Current goals can be found in the Care Plan section) Acute Rehab PT Goals Patient Stated Goal: have knee not hurt so much PT Goal Formulation: With patient Time For Goal Achievement: 07/03/15 Potential to Achieve Goals: Good    Frequency 7X/week   Barriers to discharge        Co-evaluation               End of Session Equipment Utilized During Treatment: Gait belt;Right knee immobilizer Activity Tolerance: No increased pain;Patient limited by fatigue Patient left: in chair;with call bell/phone within reach (ice applied) Nurse Communication: Mobility status         Time: 4854-6270 PT Time Calculation (min) (ACUTE ONLY): 32 min   Charges:   PT Evaluation $Initial PT Evaluation Tier I: 1 Procedure PT Treatments $Gait Training: 8-22 mins   PT G Codes:        Christiane Ha, PT, CSCS Pager [phone] Office [phone]  06/19/2015, 1:57 PM

## 2015-06-20 LAB — CBC
HCT: 25.4 % — ABNORMAL LOW (ref 36.0–46.0)
Hemoglobin: 8.6 g/dL — ABNORMAL LOW (ref 12.0–15.0)
MCH: 29.5 pg (ref 26.0–34.0)
MCHC: 33.9 g/dL (ref 30.0–36.0)
MCV: 87 fL (ref 78.0–100.0)
PLATELETS: 197 10*3/uL (ref 150–400)
RBC: 2.92 MIL/uL — ABNORMAL LOW (ref 3.87–5.11)
RDW: 13.2 % (ref 11.5–15.5)
WBC: 8.5 10*3/uL (ref 4.0–10.5)

## 2015-06-20 MED ORDER — OXYCODONE-ACETAMINOPHEN 5-325 MG PO TABS: 1.0000 | ORAL_TABLET | ORAL | Status: DC | PRN

## 2015-06-20 MED ORDER — RIVAROXABAN 10 MG PO TABS: 10.0000 mg | ORAL_TABLET | Freq: Every day | ORAL | Status: DC

## 2015-06-20 MED ORDER — TIZANIDINE HCL 4 MG PO TABS: 4.0000 mg | ORAL_TABLET | Freq: Four times a day (QID) | ORAL | Status: DC | PRN

## 2015-06-20 NOTE — Discharge Instructions (Signed)

## 2015-06-20 NOTE — Care Management Note (Signed)
Case Management Note  Patient Details  Name: Kathy [name] MRN: [id number] Date of Birth: January 03, 1966  Subjective/Objective:               S/p right total knee arthroplasty     Action/Plan: Spoke with patient about HHC, she selected [facility]. Contacted Farrah at [facility] and set up HHPT and HHOT. Contacted Jermaine at [facility] and requested rolling walker, 3N1 and a tub bench be delivered to patient's room. Patient stated that she will be staying with her sister after discharge.     Expected Discharge Date:                  Expected Discharge Plan:  Home w Home Health Services  In-House Referral:  NA  Discharge planning Services  CM Consult  Post Acute Care Choice:  Durable Medical Equipment, Home Health Choice offered to:  Patient  DME Arranged:  3-N-1, Walker rolling DME Agency:  [facility]  HH Arranged:  PT, OT HH Agency:  [facility]  Status of Service:  Completed, signed off  Medicare Important Message Given:    Date Medicare IM Given:    Medicare IM give by:    Date Additional Medicare IM Given:    Additional Medicare Important Message give by:     If discussed at Long Length of Stay Meetings, dates discussed:    Additional Comments:  Monica Becton, RN 06/20/2015, 3:48 PM

## 2015-06-20 NOTE — Progress Notes (Signed)
PT Cancellation Note  Patient Details Name: Kathy [name] MRN: [id number] DOB: 08/09/1966   Cancelled Treatment:    Reason Eval/Treat Not Completed: Other (comment) (Patient reporting that she is having too much nausea for PT). Encouraged to get up to chair with nursing later tonight. Patient agrees.    Christiane Ha, PT, CSCS Pager [phone] Office [phone]  06/20/2015, 4:14 PM

## 2015-06-20 NOTE — Progress Notes (Signed)
Initial Nutrition Assessment  DOCUMENTATION CODES:   Not applicable  INTERVENTION:   Provide nourishment snacks. (Ordered)  Encouraged adequate PO intake.  NUTRITION DIAGNOSIS:   Increased nutrient needs related to  (surgery) as evidenced by estimated needs.  GOAL:   Patient will meet greater than or equal to 90% of their needs  MONITOR:   PO intake, Weight trends, Labs, I & O's  REASON FOR ASSESSMENT:   Malnutrition Screening Tool    ASSESSMENT:   49 y.o. female, has a history of pain and functional disability in the right knee due to arthritis and has failed non-surgical conservative treatments presents for surgery.  PROCEDURE (8/23): RIGHT TOTAL KNEE ARTHROPLASTY (Right)  Pt reports appetite is just "ok". Current meal completion has been 50-100%. PTA pt reports eating fine. No significant weight loss per weight records. Pt requests nourishment snacks. RD to order. Pt was encouraged to eat her food at meals.   Pt with no observed significant fat or muscle mass loss.   Labs and medications reviewed.   Diet Order:  Diet regular Room service appropriate?: Yes; Fluid consistency:: Thin  Skin:   (Incision on R knee)  Last BM:  8/21  Height:   Ht Readings from Last 1 Encounters:  06/18/15 5\' 4"  (1.626 m)    Weight:   Wt Readings from Last 1 Encounters:  06/18/15 161 lb 6 oz (73.199 kg)    Ideal Body Weight:  54.5 kg  BMI:  Body mass index is 27.69 kg/(m^2).  Estimated Nutritional Needs:   Kcal:  1850-2050  Protein:  85-95 grams  Fluid:  1.8 - 2 L/day  EDUCATION NEEDS:   No education needs identified at this time  06/20/15, MS, RD, LDN Pager # [phone] After hours/ weekend pager # [phone]

## 2015-06-20 NOTE — Progress Notes (Signed)
Utilization review completed.  

## 2015-06-20 NOTE — Progress Notes (Signed)
This RN was called into room to assist pt to use the restroom and get cleaned up. Pt ambulated to sink to wash hands and this RN noticed a small orange pill laying next to the toilet. Upon further assessment it was identified as a 5 mg oxycodone. The pt stated she was unsure as to where the pill came from. At that time RN reinforced the importance of pt taking all administered pain medication due to the fact that pt has had pain management issues today. And if pt does not want to take any administered medications then to inform the RN so that medication can be disposed of or returned properly. Pill wasted in sharps container; witnessed by Wynona Neat RN. Chiropodist notified at this time. Nursing will continue to monitor.

## 2015-06-20 NOTE — Progress Notes (Signed)
Orthopedic Tech Progress Note Patient Details:  Kathy [name] 1966/03/01 [id number] On cpm at 7:00 pm Patient ID: Kathy [name], female   DOB: 1966/01/12, 49 y.o.   MRN: [id number]   [name] 06/20/2015, 7:02 PM

## 2015-06-20 NOTE — Progress Notes (Signed)
Subjective: 2 Days Post-Op Procedure(s) (LRB): RIGHT TOTAL KNEE ARTHROPLASTY (Right) Patient reports pain as moderate.  No acute changes overnight.  Objective: Vital signs in last 24 hours: Temp:  [98.7 F (37.1 C)-100 F (37.8 C)] 99.6 F (37.6 C) (08/25 0537) Pulse Rate:  [91-103] 103 (08/25 0537) Resp:  [18] 18 (08/25 0537) BP: (95-129)/(64-75) 113/64 mmHg (08/25 0537) SpO2:  [99 %-100 %] 100 % (08/25 0537)  Intake/Output from previous day: 08/24 0701 - 08/25 0700 In: 480 [P.O.:480] Out: -  Intake/Output this shift:     Recent Labs  06/19/15 0415 06/20/15 0434  HGB 8.4* 8.6*    Recent Labs  06/19/15 0415 06/20/15 0434  WBC 8.7 8.5  RBC 2.90* 2.92*  HCT 26.0* 25.4*  PLT 232 197    Recent Labs  06/19/15 0415  NA 129*  K 4.0  CL 95*  CO2 26  BUN 7  CREATININE 0.65  GLUCOSE 111*  CALCIUM 8.1*   No results for input(s): LABPT, INR in the last 72 hours.  Sensation intact distally Intact pulses distally Dorsiflexion/Plantar flexion intact Incision: scant drainage No cellulitis present Compartment soft  Assessment/Plan: 2 Days Post-Op Procedure(s) (LRB): RIGHT TOTAL KNEE ARTHROPLASTY (Right) Up with therapy Plan for discharge tomorrow Discharge home with home health  Kathy [name] 06/20/2015, 7:07 AM

## 2015-06-20 NOTE — Plan of Care (Signed)
Problem: Consults Goal: Diagnosis- Total Joint Replacement Primary Total Knee     

## 2015-06-20 NOTE — Progress Notes (Signed)
Physical Therapy Treatment Patient Details Name: Kathy [name] MRN: [id number] DOB: December 01, 1965 Today's Date: 06/20/2015    History of Present Illness Rt TKA due to RA    PT Comments    Patient with improved pain control during session with reports of pain as 3/10. Able to increase ambulation to 60 feet with rolling walker. Discussed need to gradually increase mobility and strength for ability to return home. Also encouraging knee extension throughout the day. Will continue with skilled PT for improved mobility to return home.   Follow Up Recommendations  Home health PT     Equipment Recommendations  Rolling walker with 5" wheels    Recommendations for Other Services       Precautions / Restrictions Precautions Precautions: Knee;Fall Precaution Booklet Issued: Yes (comment) Precaution Comments: review of HEP and need for knee extension Restrictions Weight Bearing Restrictions: Yes RLE Weight Bearing: Weight bearing as tolerated    Mobility  Bed Mobility Overal bed mobility: Needs Assistance Bed Mobility: Supine to Sit     Supine to sit: Supervision        Transfers Overall transfer level: Needs assistance Equipment used: Rolling walker (2 wheeled) Transfers: Sit to/from Stand Sit to Stand: Min guard         General transfer comment: cues to reach for chair before sitting  Ambulation/Gait Ambulation/Gait assistance: Min guard Ambulation Distance (Feet): 60 Feet Assistive device: Rolling walker (2 wheeled) Gait Pattern/deviations: Step-to pattern;Decreased weight shift to right;Decreased stance time - right Gait velocity: decreased   General Gait Details: encouraging weight bearing through RLE with ambulation   Stairs            Wheelchair Mobility    Modified Rankin (Stroke Patients Only)       Balance Overall balance assessment: Needs assistance Sitting-balance support: No upper extremity supported Sitting balance-Leahy Scale: Good      Standing balance support: Bilateral upper extremity supported Standing balance-Leahy Scale: Poor                      Cognition Arousal/Alertness: Awake/alert Behavior During Therapy: WFL for tasks assessed/performed Overall Cognitive Status: Within Functional Limits for tasks assessed                      Exercises Total Joint Exercises Ankle Circles/Pumps: AROM;Both;15 reps Quad Sets: Strengthening;Right;10 reps Gluteal Sets: Strengthening;Both;10 reps Heel Slides: AAROM;Right;10 reps (slow range progression with reps)    General Comments        Pertinent Vitals/Pain Pain Assessment: 0-10 Pain Score: 3  Pain Location: Rt knee Pain Descriptors / Indicators: Aching Pain Intervention(s): Monitored during session;Repositioned    Home Living                      Prior Function            PT Goals (current goals can now be found in the care plan section) Acute Rehab PT Goals Patient Stated Goal: go home tomorrow PT Goal Formulation: With patient Time For Goal Achievement: 07/03/15 Potential to Achieve Goals: Fair Progress towards PT goals: Progressing toward goals    Frequency  7X/week    PT Plan Current plan remains appropriate    Co-evaluation             End of Session Equipment Utilized During Treatment: Gait belt Activity Tolerance: Patient limited by fatigue;Other (comment) (states feeling a little dizzy) Patient left: in chair;with call bell/phone within reach  Time: 3893-7342 PT Time Calculation (min) (ACUTE ONLY): 23 min  Charges:  $Gait Training: 8-22 mins $Therapeutic Exercise: 8-22 mins                    G Codes:      Christiane Ha, PT, CSCS Pager [phone] Office [phone]  06/20/2015, 10:27 AM

## 2015-06-20 NOTE — Progress Notes (Signed)
Occupational Therapy Treatment Patient Details Name: Kathy [name] MRN: [id number] DOB: 07-Jan-1966 Today's Date: 06/20/2015    History of present illness Rt TKA due to RA   OT comments  Patient progressing nice towards OT goals, continue plan of care for now. Pt will need a tub transfer bench and 3-in-1 for safety with transfers at home.   Follow Up Recommendations  No OT follow up;Supervision/Assistance - 24 hour    Equipment Recommendations  3 in 1 bedside comode;Tub/shower bench    Recommendations for Other Services  None at this time   Precautions / Restrictions Precautions Precautions: Knee;Fall Precaution Booklet Issued: Yes (comment) Precaution Comments: review of HEP and need for knee extension Restrictions Weight Bearing Restrictions: Yes RLE Weight Bearing: Weight bearing as tolerated    Mobility Bed Mobility General bed mobility comments: Pt found in recliner upon OT entering/exiting room  Transfers Overall transfer level: Needs assistance Equipment used: Rolling walker (2 wheeled) Transfers: Sit to/from Stand Sit to Stand: Min guard         General transfer comment: Cues for hand placement and technique. Pt with flexed posture.     Balance Overall balance assessment: Needs assistance Sitting-balance support: No upper extremity supported;Feet supported Sitting balance-Leahy Scale: Good     Standing balance support: Bilateral upper extremity supported;During functional activity Standing balance-Leahy Scale: Fair   ADL Overall ADL's : Needs assistance/impaired General ADL Comments: Pt unable to reach RLE for LB ADLs. Pt may need AE, depending on progress. Pt found seated in recliner, therapist assisted pt from room > therapy gym. Pt engaged in tub/shower transfer using tub transfer bench with supervision and min verbal cueing needed for technique. Encouraged pt to wear KI for now until RLE becomes stronger. Encouraged pt to work on flexing knee and lift  with quad (not hip) to get RLE in/out of tub during transfers.      Cognition   Behavior During Therapy: WFL for tasks assessed/performed Overall Cognitive Status: Within Functional Limits for tasks assessed                 Pertinent Vitals/ Pain       Pain Assessment: 0-10 Pain Score: 4  Pain Location: right knee Pain Descriptors / Indicators: Aching Pain Intervention(s): Monitored during session   Frequency Min 2X/week     Progress Toward Goals  OT Goals(current goals can now befound in the care plan section)  Progress towards OT goals: Progressing toward goals  Acute Rehab OT Goals Patient Stated Goal: go home tomorrow  Plan Discharge plan remains appropriate    End of Session Equipment Utilized During Treatment: Rolling walker;Right knee immobilizer   Activity Tolerance Patient tolerated treatment well   Patient Left in chair;with call bell/phone within reach  Nurse Communication          Time: 8421-0312 OT Time Calculation (min): 19 min  Charges: OT General Charges $OT Visit: 1 Procedure OT Treatments $Self Care/Home Management : 8-22 mins  Jillianne Gamino , MS, OTR/L, CLT Pager: [phone]  06/20/2015, 11:34 AM

## 2015-06-21 MED ORDER — INFLUENZA VAC SPLIT QUAD 0.5 ML IM SUSY: 0.5000 mL | PREFILLED_SYRINGE | INTRAMUSCULAR | Status: DC

## 2015-06-21 MED ORDER — OXYCODONE HCL 5 MG PO TABS: 5.0000 mg | ORAL_TABLET | ORAL | Status: DC | PRN

## 2015-06-21 NOTE — Discharge Summary (Signed)
Patient ID: Kathy [name] MRN: [id number] DOB/AGE: December 11, 1965 49 y.o.  Admit date: 06/18/2015 Discharge date: 06/21/2015  Admission Diagnoses:  Principal Problem:   Rheumatoid arthritis of right knee Active Problems:   Status post total right knee replacement   Discharge Diagnoses:  Same  Past Medical History  Diagnosis Date  . Rheumatoid arteritis   . Asthma     seasonal  . GERD (gastroesophageal reflux disease)     Surgeries: Procedure(s): RIGHT TOTAL KNEE ARTHROPLASTY on 06/18/2015   Consultants:    Discharged Condition: Improved  Hospital Course: Kathy [name] is an 49 y.o. female who was admitted 06/18/2015 for operative treatment ofRheumatoid arthritis of knee. Patient has severe unremitting pain that affects sleep, daily activities, and work/hobbies. After pre-op clearance the patient was taken to the operating room on 06/18/2015 and underwent  Procedure(s): RIGHT TOTAL KNEE ARTHROPLASTY.    Patient was given perioperative antibiotics: Anti-infectives    Start     Dose/Rate Route Frequency Ordered Stop   06/19/15 1000  hydroxychloroquine (PLAQUENIL) tablet 200 mg     200 mg Oral Daily 06/18/15 1836     06/18/15 2030  ceFAZolin (ANCEF) IVPB 1 g/50 mL premix     1 g 100 mL/hr over 30 Minutes Intravenous Every 6 hours 06/18/15 1838 06/19/15 0400   06/18/15 1400  ceFAZolin (ANCEF) IVPB 2 g/50 mL premix     2 g 100 mL/hr over 30 Minutes Intravenous To ShortStay [facility] 06/17/15 1244 06/18/15 1421       Patient was given sequential compression devices, early ambulation, and chemoprophylaxis to prevent DVT.  Patient benefited maximally from hospital stay and there were no complications.    Recent vital signs: Patient Vitals for the past 24 hrs:  BP Temp Temp src Pulse Resp SpO2  06/21/15 0600 (!) 108/57 mmHg 99.6 F (37.6 C) - (!) 105 18 100 %  06/21/15 0300 - 99.1 F (37.3 C) Oral 100 - -  06/20/15 1945 (!) 110/57 mmHg (!) 100.4 F (38 C) - (!) 57 18 100  %  06/20/15 1300 (!) 106/59 mmHg 98.4 F (36.9 C) Oral 71 18 100 %     Recent laboratory studies:  Recent Labs  06/19/15 0415 06/20/15 0434  WBC 8.7 8.5  HGB 8.4* 8.6*  HCT 26.0* 25.4*  PLT 232 197  NA 129*  --   K 4.0  --   CL 95*  --   CO2 26  --   BUN 7  --   CREATININE 0.65  --   GLUCOSE 111*  --   CALCIUM 8.1*  --      Discharge Medications:     Medication List    STOP taking these medications        diclofenac sodium 1 % Gel  Commonly known as:  VOLTAREN     ibuprofen 600 MG tablet  Commonly known as:  ADVIL,MOTRIN      TAKE these medications        albuterol 108 (90 BASE) MCG/ACT inhaler  Commonly known as:  PROVENTIL HFA;VENTOLIN HFA  Inhale 1-2 puffs into the lungs every 6 (six) hours as needed for wheezing or shortness of breath.     folic acid 1 MG tablet  Commonly known as:  FOLVITE  Take 1 mg by mouth daily.     hydroxychloroquine 200 MG tablet  Commonly known as:  PLAQUENIL  Take 200 mg by mouth daily.     LORazepam 1 MG tablet  Commonly known  as:  ATIVAN  Take 1 tablet (1 mg total) by mouth 2 (two) times daily as needed for anxiety.     MULTIVITAMIN PO  Take 1 tablet by mouth daily.     omeprazole 20 MG capsule  Commonly known as:  PRILOSEC  Take 20 mg by mouth daily.     oxyCODONE-acetaminophen 5-325 MG per tablet  Commonly known as:  ROXICET  Take 1-2 tablets by mouth every 4 (four) hours as needed.     predniSONE 5 MG tablet  Commonly known as:  DELTASONE  Take 5 mg by mouth 2 (two) times daily with a meal.     ranitidine 150 MG tablet  Commonly known as:  ZANTAC  Take 150 mg by mouth 2 (two) times daily.     rivaroxaban 10 MG Tabs tablet  Commonly known as:  XARELTO  Take 1 tablet (10 mg total) by mouth daily with breakfast.     senna 8.6 MG Tabs tablet  Commonly known as:  SENOKOT  Take 1 tablet (8.6 mg total) by mouth daily as needed for mild constipation.     sulfaSALAzine 500 MG EC tablet  Commonly known as:   AZULFIDINE  Take 1,000 mg by mouth 2 (two) times daily.     tiZANidine 4 MG tablet  Commonly known as:  ZANAFLEX  Take 1 tablet (4 mg total) by mouth every 6 (six) hours as needed for muscle spasms.     traMADol 50 MG tablet  Commonly known as:  ULTRAM  Take 50 mg by mouth every 6 (six) hours as needed (pain).     valACYclovir 1000 MG tablet  Commonly known as:  VALTREX  Take 1 tablet (1,000 mg total) by mouth 3 (three) times daily.        Diagnostic Studies: Dg Knee Right Port  06/18/2015   CLINICAL DATA:  Status post total knee replacement.  EXAM: PORTABLE RIGHT KNEE - 1-2 VIEW  COMPARISON:  04/14/2015  FINDINGS: Total knee arthroplasty. Soft tissue changes compatible with recent surgery. Anterior skin staples are present. The knee arthroplasty is located. No evidence for a hardware complication. Small densities or tiny bone fragments along the lateral aspect of the knee.  IMPRESSION: Right total knee arthroplasty with expected postsurgical changes.   Electronically Signed   By: Richarda Overlie M.D.   On: 06/18/2015 17:30    Disposition: 01-Home or Self Care      Discharge Instructions    Discharge patient    Complete by:  As directed            Follow-up Information    Follow up with [facility].   Specialty:  Home Health Services   Why:  They will contact you to schedule home therapy visits.   Contact information:   [address] [place] [address] [phone]       Follow up with Kathryne Hitch, MD In 2 weeks.   Specialty:  Orthopedic Surgery   Contact information:   [address] [place] [address] [phone]        Signed: Kathryne Hitch 06/21/2015, 6:43 AM

## 2015-06-21 NOTE — Progress Notes (Signed)
Subjective: 3 Days Post-Op Procedure(s) (LRB): RIGHT TOTAL KNEE ARTHROPLASTY (Right) Patient reports pain as moderate.    Objective: Vital signs in last 24 hours: Temp:  [98.4 F (36.9 C)-100.4 F (38 C)] 99.6 F (37.6 C) (08/26 0600) Pulse Rate:  [57-105] 105 (08/26 0600) Resp:  [18] 18 (08/26 0600) BP: (106-110)/(57-59) 108/57 mmHg (08/26 0600) SpO2:  [100 %] 100 % (08/26 0600)  Intake/Output from previous day: 08/25 0701 - 08/26 0700 In: 720 [P.O.:720] Out: -  Intake/Output this shift: Total I/O In: 240 [P.O.:240] Out: -    Recent Labs  06/19/15 0415 06/20/15 0434  HGB 8.4* 8.6*    Recent Labs  06/19/15 0415 06/20/15 0434  WBC 8.7 8.5  RBC 2.90* 2.92*  HCT 26.0* 25.4*  PLT 232 197    Recent Labs  06/19/15 0415  NA 129*  K 4.0  CL 95*  CO2 26  BUN 7  CREATININE 0.65  GLUCOSE 111*  CALCIUM 8.1*   No results for input(s): LABPT, INR in the last 72 hours.  Sensation intact distally Intact pulses distally Dorsiflexion/Plantar flexion intact Incision: scant drainage No cellulitis present Compartment soft  Assessment/Plan: 3 Days Post-Op Procedure(s) (LRB): RIGHT TOTAL KNEE ARTHROPLASTY (Right) Up with therapy Discharge home with home health today.  Kathryne Hitch 06/21/2015, 6:42 AM

## 2015-06-21 NOTE — Progress Notes (Signed)
Physical Therapy Treatment Patient Details Name: Kathy [name] MRN: [id number] DOB: [birth year] Today's Date: 06/21/2015    History of Present Illness Rt TKA due to RA    PT Comments    Patient is making progress with PT.  From a mobility standpoint anticipate patient will be ready for DC home with family support. Patient stating that she feels confident with her mobility at this time including ambulation, and stairs. HEP has been reviewed and provided, to perform 3 X day. Patient denies any questions or concerns.       Follow Up Recommendations  Home health PT     Equipment Recommendations  Rolling walker with 5" wheels    Recommendations for Other Services       Precautions / Restrictions Precautions Precautions: Knee;Fall Precaution Booklet Issued: Yes (comment) Precaution Comments: review of HEP and need for knee extension Restrictions Weight Bearing Restrictions: Yes RLE Weight Bearing: Weight bearing as tolerated    Mobility  Bed Mobility               General bed mobility comments: in recliner upon arrival and at end of session  Transfers Overall transfer level: Needs assistance Equipment used: Rolling walker (2 wheeled) Transfers: Sit to/from Stand Sit to Stand: Supervision Stand pivot transfers: Supervision       General transfer comment: reminder for hand placement with sitting  Ambulation/Gait Ambulation/Gait assistance: Supervision Ambulation Distance (Feet): 100 Feet Assistive device: Rolling walker (2 wheeled) Gait Pattern/deviations: Step-through pattern;Decreased stance time - right;Decreased weight shift to right Gait velocity: decreased       Stairs Stairs: Yes Stairs assistance: Min guard Stair Management: Two rails Number of Stairs: 6 General stair comments: Patient refusing to attempt stairs without use of rails to simulate home. Patient reporting that she will have people on both sides of her when she does the stairs.  Encouraged trial with hand held assistance but patient refusing. Patient reports that she feels confident with doing stairs at home.   Wheelchair Mobility    Modified Rankin (Stroke Patients Only)       Balance Overall balance assessment: Needs assistance Sitting-balance support: No upper extremity supported Sitting balance-Leahy Scale: Good     Standing balance support: Single extremity supported Standing balance-Leahy Scale: Poor                      Cognition Arousal/Alertness: Awake/alert Behavior During Therapy: WFL for tasks assessed/performed Overall Cognitive Status: Within Functional Limits for tasks assessed                      Exercises Total Joint Exercises Ankle Circles/Pumps: AAROM;Both;10 reps Quad Sets: Strengthening;Right;10 reps Towel Squeeze: Strengthening;Both;5 reps Short Arc Quad: Strengthening;Right;Other reps (comment) (3 reps with max assist) Heel Slides: AAROM;Right;10 reps Hip ABduction/ADduction: Strengthening;Right;10 reps Straight Leg Raises: Strengthening;Right;5 reps (max assist) Long Arc Quad: Other (comment) (reviewed technique) Knee Flexion: Seated (reviewed technique) Goniometric ROM: 43 degrees flexion    General Comments        Pertinent Vitals/Pain Pain Assessment: 0-10 Pain Score: 3  Pain Location: rt knee Pain Descriptors / Indicators: Sore Pain Intervention(s): Monitored during session    Home Living                      Prior Function            PT Goals (current goals can now be found in the care plan section) Acute Rehab PT Goals  Patient Stated Goal: go home today PT Goal Formulation: With patient Time For Goal Achievement: 07/03/15 Potential to Achieve Goals: Fair Progress towards PT goals: Progressing toward goals    Frequency  7X/week    PT Plan Current plan remains appropriate    Co-evaluation             End of Session Equipment Utilized During Treatment: Gait  belt Activity Tolerance: Patient tolerated treatment well;No increased pain Patient left: in chair;with call bell/phone within reach     Time: 0912-0942 PT Time Calculation (min) (ACUTE ONLY): 30 min  Charges:  $Gait Training: 8-22 mins $Therapeutic Exercise: 8-22 mins                    G Codes:      Christiane Ha, PT, CSCS Pager [phone] Office [phone]  06/21/2015, 9:51 AM

## 2015-06-21 NOTE — Progress Notes (Signed)
Occupational Therapy Treatment Patient Details Name: JARVIS KNODEL MRN: [id number] DOB: Jul 27, 1966 Today's Date: 06/21/2015    History of present illness Rt TKA due to RA   OT comments  Pt. Able to complete in/out of b.room with simulated toileting tasks at S level of A. Declines need for review of tub transfer.  Noted improvement with reaching B les for LB ADLS.  D/c likely later today.  All DME has already been delivered to room.    Follow Up Recommendations  No OT follow up;Supervision/Assistance - 24 hour    Equipment Recommendations  3 in 1 bedside comode;Tub/shower bench    Recommendations for Other Services      Precautions / Restrictions Precautions Precautions: Knee;Fall Restrictions Weight Bearing Restrictions: Yes RLE Weight Bearing: Weight bearing as tolerated       Mobility Bed Mobility               General bed mobility comments: in recliner upon arrival and at end of session  Transfers Overall transfer level: Needs assistance Equipment used: Rolling walker (2 wheeled) Transfers: Sit to/from [employer] Sit to Stand: Supervision Stand pivot transfers: Supervision            Balance                                   ADL Overall ADL's : Needs assistance/impaired     Grooming: Standing;Supervision/safety Grooming Details (indicate cue type and reason): simulated during b.room transfer while amb. in/out of b.room (decilned actual need for review)             Lower Body Dressing: Sitting/lateral leans;Min guard Lower Body Dressing Details (indicate cue type and reason): pt. able to reach forward in sitting to B LES for don/doff clothing Toilet Transfer: Supervision/safety;Ambulation;RW;BSC;Regular Teacher, adult education Details (indicate cue type and reason): simulated while amb. in b.room declined actual need for return demo "ive been going to/from the b.room" Toileting- Architect and Hygiene:  Supervision/safety;Sit to/from stand Toileting - Clothing Manipulation Details (indicate cue type and reason): simulated   Tub/Shower Transfer Details (indicate cue type and reason): reports she performed tub transfer yesterday and does not need to practice again   General ADL Comments: S level for simulated tasks, declined actual demo of most suggested review.  equipment/DME already delivered to room, pt. reports not questions with the DME.  d/c likely later today      Vision                     Perception     Praxis      Cognition   Behavior During Therapy: [facility] for tasks assessed/performed Overall Cognitive Status: Within Functional Limits for tasks assessed                       Extremity/Trunk Assessment               Exercises     Shoulder Instructions       General Comments      Pertinent Vitals/ Pain       Pain Assessment: No/denies pain  Home Living                                          Prior Functioning/Environment  Frequency Min 2X/week     Progress Toward Goals  OT Goals(current goals can now be found in the care plan section)  Progress towards OT goals: Progressing toward goals     Plan Discharge plan remains appropriate    Co-evaluation                 End of Session Equipment Utilized During Treatment: Gait belt;Rolling walker CPM Right Knee CPM Right Knee: On Additional Comments: pt refused bone foam; Ice KI applied   Activity Tolerance Patient tolerated treatment well   Patient Left in chair;with call bell/phone within reach   Nurse Communication          Time: [phone] OT Time Calculation (min): 18 min  Charges: OT General Charges $OT Visit: 1 Procedure OT Treatments $Self Care/Home Management : 8-22 mins  Robet Leu, COTA/L 06/21/2015, 9:21 AM

## 2015-06-24 ENCOUNTER — Other Ambulatory Visit: Payer: Self-pay | Admitting: Family Medicine

## 2015-06-24 ENCOUNTER — Other Ambulatory Visit: Payer: Self-pay | Admitting: *Deleted

## 2015-06-24 NOTE — Telephone Encounter (Signed)
Pt needs a refill on Lorazepam sent to [facility] on [address]. Sadie Reynolds, ASA

## 2015-06-24 NOTE — Telephone Encounter (Signed)
I have never prescribed this medication for the patient. Please have her come in to be evaluated to see if this is appropriate.  Thanks, Joanna Puff, MD [facility] Family Medicine Resident  06/24/2015, 2:39 PM

## 2015-06-25 NOTE — Telephone Encounter (Signed)
Left message for patient to call back and schedule an appointment.

## 2015-07-10 ENCOUNTER — Other Ambulatory Visit: Payer: Self-pay | Admitting: Family Medicine

## 2015-07-10 ENCOUNTER — Ambulatory Visit: Payer: Medicaid Other | Attending: Orthopaedic Surgery | Admitting: Physical Therapy

## 2015-07-10 DIAGNOSIS — M25461 Effusion, right knee: Secondary | ICD-10-CM

## 2015-07-10 DIAGNOSIS — R29898 Other symptoms and signs involving the musculoskeletal system: Secondary | ICD-10-CM

## 2015-07-10 DIAGNOSIS — M25561 Pain in right knee: Secondary | ICD-10-CM | POA: Diagnosis not present

## 2015-07-10 DIAGNOSIS — R269 Unspecified abnormalities of gait and mobility: Secondary | ICD-10-CM | POA: Diagnosis present

## 2015-07-10 DIAGNOSIS — M25661 Stiffness of right knee, not elsewhere classified: Secondary | ICD-10-CM

## 2015-07-10 NOTE — Telephone Encounter (Signed)
2nd request.  Arlita Buffkin L, RN  

## 2015-07-10 NOTE — Patient Instructions (Signed)
   Zebbie Ace PT, DPT, LAT, ATC  [facility] Phone: [phone]     

## 2015-07-10 NOTE — Telephone Encounter (Signed)
Patient called twice within 3 hours for Rx.  Please let her know that her Rx has been refilled. Also let her know that she should contact her pharmacy and request Rxs earlier as it can take 24-48 hours for a refill.  Thanks,  Joanna Puff, MD [facility] Family Medicine Resident  07/10/2015, 12:31 PM

## 2015-07-10 NOTE — Therapy (Signed)
[facility] [address] [place], [place], [address] Phone: [phone]   Fax:  [phone]  Physical Therapy Evaluation  Patient Details  Name: Kathy [name] MRN: [id number] Date of Birth: 06/11/1966 Referring Provider:  Kathryne Hitch*  Encounter Date: 07/10/2015      PT End of Session - 07/10/15 1801    Visit Number 1   Number of Visits 8   Date for PT Re-Evaluation 09/04/15   Authorization Type Medicaid    Authorization - Visit Number 1   Authorization - Number of Visits 8   PT Start Time 1415   PT Stop Time 1500   PT Time Calculation (min) 45 min   Activity Tolerance Patient limited by pain   Behavior During Therapy [facility] for tasks assessed/performed      Past Medical History  Diagnosis Date  . Rheumatoid arteritis   . Asthma     seasonal  . GERD (gastroesophageal reflux disease)     Past Surgical History  Procedure Laterality Date  . Abdominal hysterectomy    . Total knee arthroplasty Right 06/18/2015    Procedure: RIGHT TOTAL KNEE ARTHROPLASTY;  Surgeon: Kathryne Hitch, MD;  Location: [facility] OR;  Service: Orthopedics;  Laterality: Right;    There were no vitals filed for this visit.  Visit Diagnosis:  Right knee pain - Plan: PT plan of care cert/re-cert  Knee swelling, right - Plan: PT plan of care cert/re-cert  Weakness of right leg - Plan: PT plan of care cert/re-cert  Decreased ROM of right knee - Plan: PT plan of care cert/re-cert  Abnormality of gait - Plan: PT plan of care cert/re-cert      Subjective Assessment - 07/10/15 1427    Subjective pt is a 49 y.o F with s/p R TKA on on 06/18/2015. since surery she reports the pain is about the same.    Pertinent History hx of RA   Limitations Standing;Walking;Sitting;House hold activities   How long can you sit comfortably? 5-10 min   How long can you stand comfortably? 5 min   How long can you walk comfortably? 15-20 min    Diagnostic tests x-ray 07/03/2015 pt reported that the hardware looks good.   Patient Stated Goals to be back normal, to bend knees, and to be pain free   Currently in Pain? Yes   Pain Score 7   took pain medication @ 9am   Pain Location Knee   Pain Orientation Right   Pain Descriptors / Indicators Aching;Burning;Numbness   Pain Type Surgical pain   Pain Radiating Towards Toward the ankle   Pain Onset 1 to 4 weeks ago   Pain Frequency Constant   Aggravating Factors  prolonged sitting, laying down,    Pain Relieving Factors pain medication, ice   Effect of Pain on Daily Activities limited endurance, AROM, Strength            [facility] PT Assessment - 07/10/15 1433    Assessment   Medical Diagnosis R TKA   Onset Date/Surgical Date --  06/18/2015   Hand Dominance Right   Next MD Visit --  October sometime   Prior Therapy no   Precautions   Precautions None  per pt report   Restrictions   Weight Bearing Restrictions No   Balance Screen   Has the patient fallen in the past 6 months No   Has the patient had a decrease in activity level because of a fear of falling?  No   Is the patient reluctant to leave their home because of a fear of falling?  No   Home Environment   Living Environment Private residence   Living Arrangements Alone   Type of Home Apartment   Home Access Level entry   Home Layout One level   Home Equipment Walker - 2 wheels;Cane - single point   Prior Function   Level of Independence Independent;Independent with basic ADLs;Independent with household mobility with device;Independent with community mobility with device   Vocation On disability   Leisure going to church, exercise around the house   Cognition   Overall Cognitive Status Within Functional Limits for tasks assessed   Observation/Other Assessments   Observations incision appears clean and healing well   Focus on Therapeutic Outcomes (FOTO)  not taken at inital visit   Observation/Other  Assessments-Edema    Edema Circumferential   Posture/Postural Control   Posture/Postural Control Postural limitations   Postural Limitations Rounded Shoulders;Forward head   Posture Comments @ knee joint 42.5cm , 10 cm below 40cm, 10 cm above 47.5cm    ROM / Strength   AROM / PROM / Strength AROM;PROM;Strength   AROM   AROM Assessment Site Knee   Right/Left Knee Right;Left   Right Knee Extension -38   Right Knee Flexion 45   Left Knee Extension 5   Left Knee Flexion 132   PROM   PROM Assessment Site Knee   Right/Left Knee Right;Left   Right Knee Extension -35   Right Knee Flexion 81   Strength   Overall Strength Comments unable to assess R knee due to AROM limitation and pain   Strength Assessment Site Knee   Right/Left Knee Right;Left   Left Knee Flexion 5/5   Left Knee Extension 5/5   Ambulation/Gait   Gait Pattern Step-to pattern;Decreased stance time - right;Decreased step length - left;Decreased stride length;Ataxic  with 2 wheeled RW                           PT Education - 07/10/15 1801    Education provided Yes   Education Details evaluation findings, POC, Goals, HEP   Person(s) Educated Patient   Methods Explanation   Comprehension Verbalized understanding          PT Short Term Goals - 07/10/15 1808    PT SHORT TERM GOAL #1   Title pt will be I with inital HEP (08/09/2015)   Baseline no previous HEP   Time 4   Period Weeks   Status New   PT SHORT TERM GOAL #2   Title pt will increase knee mobility by > 15 degrees in all planes to assist with functional progression (08/09/2015)   Baseline right knee AROM ext -38 and flexion 45 degees   Time 4   Period Weeks   Status New   PT SHORT TERM GOAL #3   Title pt will be able to verbalie and demonstrate techniques to reduce right knee swelling and pain via RICE and HEP (08/09/2015)   Baseline no previous knowledge on how to control pain/inflammation   Time 4   Period Weeks   Status New    PT SHORT TERM GOAL #4   Title pt will be able to stand for >10 minutes with < 5/10 pain to help promote functional endurance (08/09/2015)   Baseline pt reports being able to stand for 5 min   Time 4   Period Weeks  Status New           PT Long Term Goals - 07/10/15 1813    PT LONG TERM GOAL #1   Title pt will be I with all HEP given throughout therapy (09/04/2015)   Baseline no previous HEP    Time 8   Period Weeks   Status New   PT LONG TERM GOAL #2   Title pt will increase her knee flexion to > 90 degrees and extension to less than -15 degrees to assist with funcitonal and efficient gait (09/04/2015)   Baseline right knee AROM ext -38 and flexion 45 degees   Time 8   Period Weeks   Status New   PT LONG TERM GOAL #3   Title pt will demonstrate R knee strength to >4-/5 in her available ROM to assist with community ambulations and safety while walking (09/04/2015)   Baseline unable to assess right knee strength due to pain and limited ROM   Time 8   Period Weeks   Status New   PT LONG TERM GOAL #4   Title pt will be able to walk/stand > 30 minutes with LRAD with < 3/10 pain to assist with ADLS (09/04/2015)   Baseline standing 5 min, walking 10-15 min   Time 8   Period Weeks   Status New               Plan - 07/10/15 1802    Clinical Impression Statement Geneveive presents to OPPT s/p R TKA on 06/18/2015. The incision appears to be cleand and healing well. she demonstrates significant limtation of AROM/PROM of the R knee, with pain and tightness noted at endranges. MMT wasn't assessed today due to the lack of ROM and pain. she currently ambulates with a rollater with an antalgic gait pattern.  pt would benefit from skilled physical therapy to decrased her pain improve mobility and return her to her PLOF by addressing the impairments listed.   CPT TKA [phone]   Pt will benefit from skilled therapeutic intervention in order to improve on the following deficits Abnormal  gait;Decreased activity tolerance;Decreased balance;Decreased endurance;Decreased mobility;Difficulty walking;Decreased range of motion;Hypomobility;Increased edema;Decreased strength;Postural dysfunction;Improper body mechanics;Impaired sensation;Pain   Rehab Potential Good   Clinical Impairments Affecting Rehab Potential hx of RA.    PT Frequency 1x / week  every other week   PT Duration 8 weeks   PT Treatment/Interventions ADLs/Self Care Home Management;Cryotherapy;Electrical Stimulation;Iontophoresis 4mg /ml Dexamethasone;Moist Heat;Ultrasound;Functional mobility training;Therapeutic activities;Therapeutic exercise;Balance training;Neuromuscular re-education;Manual techniques;Passive range of motion;Scar mobilization;Dry needling;Taping;Vasopneumatic Device   PT Next Visit Plan assess response to HEP, knee stretching/mobiliztions, hip strengthening, modalities PRN   PT Home Exercise Plan quad sets, SLR, hamstring stretch, sit to stand, heel slide with strap   Consulted and Agree with Plan of Care Patient;Family member/caregiver   Family Member Consulted son         Problem List Patient Active Problem List   Diagnosis Date Noted  . Rheumatoid arthritis of right knee 06/18/2015  . Status post total right knee replacement 06/18/2015  . Tachycardia 04/04/2015  . 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 Tingling 03/15/2015  . Malnutrition of moderate degree 09/20/2014  . Rheumatoid arthritis flare 09/18/2014  . Hypokalemia 09/18/2014  . Loss of weight 09/18/2014  . Anorexia 09/18/2014  . Anemia 09/18/2014  . TROCHANTERIC BURSITIS, RIGHT 05/20/2010  . VITAMIN D DEFICIENCY 02/16/2010  . COUGH 02/04/2010  . RHEUMATOID ARTHRITIS 09/15/2009  . DEGENERATIVE JOINT DISEASE, GENERALIZED 07/28/2007   Lulu Riding PT,  DPT, LAT, ATC  07/10/2015  6:22 PM      [facility] [address] [place], [place],  [address] Phone: [phone]   Fax:  [phone]

## 2015-07-11 ENCOUNTER — Telehealth: Payer: Self-pay | Admitting: Family Medicine

## 2015-07-11 NOTE — Telephone Encounter (Signed)
Pt called and would like enough Ativan to get her to her appointment on 9/20. She is out and had a really bad attack last night. Can we fax this to [facility] since they deliver and she can not get there. jw

## 2015-07-13 NOTE — Telephone Encounter (Signed)
I have never prescribed this prescription for the patient. She will either need to call the MD who prescribed this for her (the orthopedist).  Thanks, [employer]

## 2015-07-16 ENCOUNTER — Ambulatory Visit: Payer: Medicaid Other | Admitting: Family Medicine

## 2015-07-16 ENCOUNTER — Other Ambulatory Visit: Payer: Self-pay | Admitting: Family Medicine
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
## 2015-07-16 NOTE — Telephone Encounter (Signed)
Tried calling patient, line busy. Per MD no refills without an appointment, see previous phone note.

## 2015-07-18 ENCOUNTER — Other Ambulatory Visit: Payer: Self-pay | Admitting: Family Medicine

## 2015-07-18 NOTE — Telephone Encounter (Signed)
As noted previously. I have never prescribed this medication and will not fill it. She will need to contact the office of the physician who filled it.  Thanks, Joanna Puff, MD [facility] Family Medicine Resident  07/18/2015, 11:15 AM

## 2015-07-18 NOTE — Telephone Encounter (Signed)
Patient called again requesting lorazepam refill due to panic attack she suffered last night.  Informed patient that she may still have to see provider before a rx is given.

## 2015-07-19 NOTE — Telephone Encounter (Signed)
Pt calling once more about this request. She is asking that a member of the clinical staff to contact her. Sadie Reynolds, ASA

## 2015-07-19 NOTE — Telephone Encounter (Signed)
Appt scheduled for panic attacks for next week

## 2015-07-23 ENCOUNTER — Encounter: Payer: Self-pay | Admitting: Family Medicine

## 2015-07-23 ENCOUNTER — Ambulatory Visit (INDEPENDENT_AMBULATORY_CARE_PROVIDER_SITE_OTHER): Payer: Medicaid Other | Admitting: Family Medicine

## 2015-07-23 VITALS — BP 137/83 | HR 113 | Temp 98.1°F | Ht 64.0 in | Wt 156.2 lb

## 2015-07-23 DIAGNOSIS — Z23 Encounter for immunization: Secondary | ICD-10-CM

## 2015-07-23 DIAGNOSIS — F410 Panic disorder [episodic paroxysmal anxiety] without agoraphobia: Secondary | ICD-10-CM | POA: Diagnosis present

## 2015-07-23 MED ORDER — CITALOPRAM HYDROBROMIDE 10 MG PO TABS: 10.0000 mg | ORAL_TABLET | Freq: Every day | ORAL | Status: DC

## 2015-07-23 NOTE — Assessment & Plan Note (Addendum)
Clinically consistent with chronic panic disorder, about 1-2x attacks weekly (last ED visit 8/11), improved on recent Lorazepam 1mg  PRN. May be primary panic disorder, but unclear if underlying anxiety or mood disorder/depression (PHQ9: score 9), mainly limiting her with affecting sleep, chronic fatigue, still functioning outside of house without agoraphobia. Overall she exhibits some concerns with potential seeking behavior for lorazepam rx today, occasional vague and contradicting statements (initially tried to say that Dr. PCP was already prescribing lorazepam), especially with sister present also requesting this rx for the patient, sister is giving patient some of her lorazepam as well.  Plan: 1. Extensive counseling / education on her Panic Disorder, and long-term treatment goals of anxiety / mood disorder / panic disorder, emphasis on controlling underlying problem and preventing panic attacks. Strongly advised against long-term use of lorazepam for her treatment, as it will treat the panic attacks but not underlying problem which can cause new problems, and cautioned on risk of addiction 2. Start citalopram 10mg  daily (#60 tabs, 1 refill) - start 10mg  daily x 1-2 weeks, then taper up to 2 tabs (20mg ) daily for 2 weeks 3. Reduce caffeine intake 4. RTC 4 weeks for follow-up panic disorder, see if citalopram is helping, consider titrating up to 40mg  daily max dose, alternatively may switch to different SSRI (no prior treatment failures). Would strongly suggest UDS at this next visit (to see if she has agreed to stop taking sisters lorazepam)

## 2015-07-23 NOTE — Progress Notes (Signed)
Subjective:    Patient ID: Kathy [name], female    DOB: [birth year], 49 y.o.   MRN: [id number]  Kathy [name] is a 49 y.o. female presenting on 07/23/2015 for Panic Attack  Patient presents for a same day appointment. History provided mostly by patient, accompanied by sister who often answered some questions and provided history as well.  HPI  PANIC ATTACKS / ANXIETY: - Recent history with ED visit on 06/06/15, diagnosed with Panic Attack (see note for details), but briefly this episode woke patient up from sleep, constellation of symptoms with racing heart, shaky, dizziness, did feel anxious, tachypnea, similar to prior panic attacks, gradually resolved, presented to ED received Lorazepamwith improvement, then received rx for Lorazepam 1mg  PRN (#10, 0 refills) and advised to follow-up with PCP for anxiety. - Review of Controlled Substance Database prior to visit showed that patient filled the Lorazepam from 05/2015, but has not filled any other prescriptions for Lorazepam or any BDZ. She has been taking Percocet and Tramadol from Orthopedics (Dr. 06/2015) from recent R-knee surgery TKR (06/18/15), but does admit receiving Lorazepam in the hospital for this surgery / recovery. - Today patient reports that she is hear to follow-up from ED and she "needs a prescription of Lorazepam", this was echoed by her sister, who stated that she is currently receiving Lorazepam for chronic anxiety / panic attacks as well, and [name] should also get "Lorazepam 30 pills a month" like her. Additionally the patient states that Dr. Osvaldo Human had "prescribed Lorazepam already", but then later seemed confused when I told her that she has not been given any other rx for Lorazepam since the ED. Both she and her sister openly admit to her using an occasional Lorazepam 1mg  qhs PRN panic attacks, last dose took one 2 nights ago on 07/21/15. - Admits significant family history with chronic anxiety and panic attacks (mother and  5 sisters), personal history of reported depression while living in "many years ago", unable to recall any medications or therapy she received, doesn't believe she started anything. Currently her anxiety/depression does not seem to be affecting her life other than limiting her sleep and causing some generalized fatigue. - Major source of stress is her RA with chronic pain - Drinks some caffeine with coffee, tea - Denies any suicidal or homicidal ideation   Past Medical History  Diagnosis Date  . Rheumatoid arteritis   . Asthma     seasonal  . GERD (gastroesophageal reflux disease)     Social History   Social History  . Marital Status: Single    Spouse Name: N/A  . Number of Children: N/A  . Years of Education: N/A   Occupational History  . Not on file.   Social History Main Topics  . Smoking status: Never Smoker   . Smokeless tobacco: Not on file  . Alcohol Use: No  . Drug Use: No  . Sexual Activity: Not on file   Other Topics Concern  . Not on file   Social History Narrative    Current Outpatient Prescriptions on File Prior to Visit  Medication Sig  . albuterol (PROVENTIL HFA;VENTOLIN HFA) 108 (90 BASE) MCG/ACT inhaler Inhale 1-2 puffs into the lungs every 6 (six) hours as needed for wheezing or shortness of breath.  . folic acid (FOLVITE) 1 MG tablet Take 1 mg by mouth daily.  . hydroxychloroquine (PLAQUENIL) 200 MG tablet Take 200 mg by mouth daily.  . Multiple Vitamins-Minerals (MULTIVITAMIN PO) Take 1 tablet by  mouth daily.  Marland Kitchen omeprazole (PRILOSEC) 20 MG capsule Take 20 mg by mouth daily.  Marland Kitchen oxyCODONE (ROXICODONE) 5 MG immediate release tablet Take 1-2 tablets (5-10 mg total) by mouth every 4 (four) hours as needed for severe pain.  . predniSONE (DELTASONE) 5 MG tablet Take 5 mg by mouth 2 (two) times daily with a meal.   . ranitidine (ZANTAC) 150 MG tablet Take 150 mg by mouth 2 (two) times daily.  . ranitidine (ZANTAC) 150 MG tablet TAKE ONE (1)  TABLET BY MOUTH TWO (2) TIMES DAILY AS NEEDED  . rivaroxaban (XARELTO) 10 MG TABS tablet Take 1 tablet (10 mg total) by mouth daily with breakfast.  . senna (SENOKOT) 8.6 MG TABS tablet Take 1 tablet (8.6 mg total) by mouth daily as needed for mild constipation.  . sulfaSALAzine (AZULFIDINE) 500 MG EC tablet Take 1,000 mg by mouth 2 (two) times daily.  Marland Kitchen tiZANidine (ZANAFLEX) 4 MG tablet Take 1 tablet (4 mg total) by mouth every 6 (six) hours as needed for muscle spasms.  . traMADol (ULTRAM) 50 MG tablet Take 50 mg by mouth every 6 (six) hours as needed (pain).  . valACYclovir (VALTREX) 1000 MG tablet Take 1 tablet (1,000 mg total) by mouth 3 (three) times daily.   No current facility-administered medications on file prior to visit.    Review of Systems  Constitutional: Positive for fatigue (chronic). Negative for fever, chills, diaphoresis, activity change and appetite change.  HENT: Negative for congestion and hearing loss.   Eyes: Negative for visual disturbance.  Respiratory: Negative for cough, chest tightness, shortness of breath and wheezing.   Cardiovascular: Negative for chest pain, palpitations and leg swelling.  Gastrointestinal: Negative for nausea, vomiting, abdominal pain, diarrhea and constipation.  Genitourinary: Negative for dysuria, frequency and hematuria.  Musculoskeletal: Negative for arthralgias and neck pain.  Skin: Negative for rash.  Neurological: Negative for dizziness, weakness, light-headedness, numbness and headaches.  Hematological: Negative for adenopathy.  Psychiatric/Behavioral: Positive for sleep disturbance (difficulty falling asleep and staying asleep, occasional panic attacks disrupt sleep) and dysphoric mood (intermittently, not daily, not limiting function). Negative for behavioral problems, confusion and decreased concentration. The patient is nervous/anxious (with panic attacks only).    Per HPI unless specifically indicated above     Objective:     BP 137/83 mmHg  Pulse 113  Temp(Src) 98.1 F (36.7 C) (Oral)  Ht 5\' 4"  (1.626 m)  Wt 156 lb 3.2 oz (70.852 kg)  BMI 26.80 kg/m2  Wt Readings from Last 3 Encounters:  07/23/15 156 lb 3.2 oz (70.852 kg)  06/18/15 161 lb 6 oz (73.199 kg)  06/07/15 161 lb 6 oz (73.2 kg)    Physical Exam  Constitutional: She is oriented to person, place, and time. She appears well-developed and well-nourished. No distress.  HENT:  Head: Normocephalic and atraumatic.  Mouth/Throat: Oropharynx is clear and moist.  Eyes: Conjunctivae and EOM are normal. Pupils are equal, round, and reactive to light.  Neck: Normal range of motion. Neck supple. No thyromegaly present.  Cardiovascular: Normal rate, regular rhythm, normal heart sounds and intact distal pulses.   No murmur heard. Pulmonary/Chest: Effort normal and breath sounds normal. No respiratory distress. She has no wheezes. She has no rales. She exhibits no tenderness.  Musculoskeletal: Normal range of motion. She exhibits no edema or tenderness.  Lymphadenopathy:    She has no cervical adenopathy.  Neurological: She is alert and oriented to person, place, and time.  Skin: Skin is warm and dry.  No rash noted. She is not diaphoretic.  Psychiatric: She has a normal mood and affect. Her behavior is normal. Judgment and thought content normal.  Not anxious appearing. Responses very vague at times.  Nursing note and vitals reviewed.  Results for orders placed or performed during the hospital encounter of 06/18/15  CBC  Result Value Ref Range   WBC 8.7 4.0 - 10.5 K/uL   RBC 2.90 (L) 3.87 - 5.11 MIL/uL   Hemoglobin 8.4 (L) 12.0 - 15.0 g/dL   HCT 63.7 (L) 85.8 - 85.0 %   MCV 89.7 78.0 - 100.0 fL   MCH 29.0 26.0 - 34.0 pg   MCHC 32.3 30.0 - 36.0 g/dL   RDW 27.7 41.2 - 87.8 %   Platelets 232 150 - 400 K/uL  Basic metabolic panel  Result Value Ref Range   Sodium 129 (L) 135 - 145 mmol/L   Potassium 4.0 3.5 - 5.1 mmol/L   Chloride 95 (L) 101 - 111  mmol/L   CO2 26 22 - 32 mmol/L   Glucose, Bld 111 (H) 65 - 99 mg/dL   BUN 7 6 - 20 mg/dL   Creatinine, Ser 6.76 0.44 - 1.00 mg/dL   Calcium 8.1 (L) 8.9 - 10.3 mg/dL   GFR calc non Af Amer >60 >60 mL/min   GFR calc Af Amer >60 >60 mL/min   Anion gap 8 5 - 15  CBC  Result Value Ref Range   WBC 8.5 4.0 - 10.5 K/uL   RBC 2.92 (L) 3.87 - 5.11 MIL/uL   Hemoglobin 8.6 (L) 12.0 - 15.0 g/dL   HCT 72.0 (L) 94.7 - 09.6 %   MCV 87.0 78.0 - 100.0 fL   MCH 29.5 26.0 - 34.0 pg   MCHC 33.9 30.0 - 36.0 g/dL   RDW 28.3 66.2 - 94.7 %   Platelets 197 150 - 400 K/uL      Assessment & Plan:   Problem List Items Addressed This Visit      Other   Panic disorder - Primary    Clinically consistent with chronic panic disorder, about 1-2x attacks weekly (last ED visit 8/11), improved on recent Lorazepam 1mg  PRN. May be primary panic disorder, but unclear if underlying anxiety or mood disorder/depression (PHQ9: score 9), mainly limiting her with affecting sleep, chronic fatigue, still functioning outside of house without agoraphobia. Overall she exhibits some concerns with potential seeking behavior for lorazepam rx today, occasional vague and contradicting statements (initially tried to say that Dr. Leonides Schanz PCP was already prescribing lorazepam), especially with sister present also requesting this rx for the patient, sister is giving patient some of her lorazepam as well.  Plan: 1. Extensive counseling / education on her Panic Disorder, and long-term treatment goals of anxiety / mood disorder / panic disorder, emphasis on controlling underlying problem and preventing panic attacks. Strongly advised against long-term use of lorazepam for her treatment, as it will treat the panic attacks but not underlying problem which can cause new problems, and cautioned on risk of addiction 2. Start citalopram 10mg  daily (#60 tabs, 1 refill) - start 10mg  daily x 1-2 weeks, then taper up to 2 tabs (20mg ) daily for 2 weeks 3.  RTC 4 weeks for follow-up panic disorder, see if citalopram is helping, consider titrating up to 40mg  daily max dose, alternatively may switch to different SSRI (no prior treatment failures). Would strongly suggest UDS at this next visit (to see if she has agreed to stop taking sisters lorazepam)  Relevant Medications   citalopram (CELEXA) 10 MG tablet    Other Visit Diagnoses    Encounter for immunization           Meds ordered this encounter  Medications  . citalopram (CELEXA) 10 MG tablet    Sig: Take 1-2 tablets (10-20 mg total) by mouth daily. Start 1 tab daily for 1-2 weeks, if not effective, increase to 2 tabs (20mg ) daily.    Dispense:  60 tablet    Refill:  1      Follow up plan: Return in about 4 weeks (around 08/20/2015) for anxiety, panic disorder.  A total of 30 minutes was spent face-to-face with this patient. Over half this time was spent on counseling patient on the diagnosis and different diagnostic and therapeutic options available.  Saralyn Pilar, DO [facility], PGY-3

## 2015-07-23 NOTE — Patient Instructions (Signed)
Thank you for coming in to clinic today.  1. For your Panic Attacks - I think that there many be an underlying cause to trigger your panic attacks, this may be a combination of anxiety, depression or other conditions. My goal is to try to treat your underlying problem and help prevent panic attacks. - Start with taking Celexa (Citalopram) 10mg  tabs - take one daily in morning every day for next 1-2 weeks, this is standing starting dose for anxiety, if this is not helping, you can increase to take 2 tablets (20mg ) daily for next 2 weeks until you return - As discussed, Ativan (Lorazepam) is not the best medication for this, yes it works fast and acts as a band-aid but our recommendation is not to continue this long-term - Also please reduce any Caffeine intake, or other substances such as alcohol  Please schedule a follow-up appointment with Dr. within 1 month for follow-up Anxiety / Panic Disorder, you may need to try calling periodically over next few weeks to see if she gets an opening in her schedule to re-schedule.  If you have any other questions or concerns, please feel free to call the clinic to contact me. You may also schedule an earlier appointment if necessary.  However, if your symptoms get significantly worse, please go to the Emergency Department to seek immediate medical attention.  , DO [facility]

## 2015-07-30 ENCOUNTER — Ambulatory Visit: Payer: Medicaid Other | Attending: Orthopaedic Surgery | Admitting: Physical Therapy

## 2015-07-30 DIAGNOSIS — M25461 Effusion, right knee: Secondary | ICD-10-CM

## 2015-07-30 DIAGNOSIS — R29898 Other symptoms and signs involving the musculoskeletal system: Secondary | ICD-10-CM

## 2015-07-30 DIAGNOSIS — R269 Unspecified abnormalities of gait and mobility: Secondary | ICD-10-CM | POA: Diagnosis present

## 2015-07-30 DIAGNOSIS — M25661 Stiffness of right knee, not elsewhere classified: Secondary | ICD-10-CM

## 2015-07-30 DIAGNOSIS — M25561 Pain in right knee: Secondary | ICD-10-CM | POA: Diagnosis not present

## 2015-07-31 NOTE — Therapy (Signed)
[place], [place], [address] Phone: [phone]   Fax:  [phone]  Physical Therapy Treatment  Patient Details  Name: Kathy [name] MRN: [id number] Date of Birth: 05-12-1966 Referring Provider:  Archie Patten, MD  Encounter Date: 07/30/2015      PT End of Session - 07/30/15 0904    Visit Number 2   Number of Visits 8   Date for PT Re-Evaluation 09/04/15   Authorization Type Medicaid    PT Start Time 0215   PT Stop Time 0300   PT Time Calculation (min) 45 min      Past Medical History  Diagnosis Date  . Rheumatoid arteritis   . Asthma     seasonal  . GERD (gastroesophageal reflux disease)     Past Surgical History  Procedure Laterality Date  . Abdominal hysterectomy    . Total knee arthroplasty Right 06/18/2015    Procedure: RIGHT TOTAL KNEE ARTHROPLASTY;  Surgeon: Mcarthur Rossetti, MD;  Location: [facility];  Service: Orthopedics;  Laterality: Right;    There were no vitals filed for this visit.  Visit Diagnosis:  Right knee pain  Knee swelling, right  Weakness of right leg  Decreased ROM of right knee  Abnormality of gait      Subjective Assessment - 07/30/15 1418    Subjective I have a lot of pain at night 10/10   Currently in Pain? Yes   Pain Score 8    Pain Location Knee   Pain Orientation Right   Pain Descriptors / Indicators Aching;Burning;Numbness   Pain Type Surgical pain   Aggravating Factors  trying to sleep   Pain Relieving Factors pain meds ice                         [facility] Adult PT Treatment/Exercise - 07/30/15 1430    Self-Care   Self-Care Scar Mobilizations;Retrograde Massage   Scar Mobilizations Instructed ot on scar massage to self   Retrograde Massage Instructed pt in soft tissue work to [employer] as well as retro grade massage, heat to soften musccles as needed   Knee/Hip Exercises: Stretches   Active Hamstring Stretch 2 reps;20  seconds   Active Hamstring Stretch Limitations with strap   Knee/Hip Exercises: Supine   Quad Sets 20 reps   Quad Sets Limitations heel prop with bolster under knee   Heel Slides 10 reps   Manual Therapy   Manual Therapy Soft tissue mobilization   Soft tissue mobilization Rock tool used to soften quads -very tender, scar massage                  PT Short Term Goals - 07/31/15 2778    PT SHORT TERM GOAL #1   Title pt will be I with inital HEP (08/09/2015)   Baseline requires cues   Time 4   Period Weeks   Status On-going   PT SHORT TERM GOAL #2   Title pt will increase knee mobility by > 15 degrees in all planes to assist with functional progression (08/09/2015)   Baseline right knee AROM ext -48 and flexion 68 degees   Time 4   Period Weeks   Status Partially Met   PT SHORT TERM GOAL #3   Title pt will be able to verbalie and demonstrate techniques to reduce right knee swelling and pain via RICE and HEP (08/09/2015)   Baseline no previous knowledge on how to  control pain/inflammation   Time 4   Period Weeks   Status On-going   PT SHORT TERM GOAL #4   Title pt will be able to stand for >10 minutes with < 5/10 pain to help promote functional endurance (08/09/2015)   Baseline pt reports being able to stand for 5 min   Time 4   Period Weeks   Status On-going           PT Long Term Goals - 07/10/15 1813    PT LONG TERM GOAL #1   Title pt will be I with all HEP given throughout therapy (09/04/2015)   Baseline no previous HEP    Time 8   Period Weeks   Status New   PT LONG TERM GOAL #2   Title pt will increase her knee flexion to > 90 degrees and extension to less than -15 degrees to assist with funcitonal and efficient gait (09/04/2015)   Baseline right knee AROM ext -38 and flexion 45 degees   Time 8   Period Weeks   Status New   PT LONG TERM GOAL #3   Title pt will demonstrate R knee strength to >4-/5 in her available ROM to assist with community  ambulations and safety while walking (09/04/2015)   Baseline unable to assess right knee strength due to pain and limited ROM   Time 8   Period Weeks   Status New   PT LONG TERM GOAL #4   Title pt will be able to walk/stand > 30 minutes with LRAD with < 3/10 pain to assist with ADLS (59/11/9242)   Baseline standing 5 min, walking 10-15 min   Time 8   Period Weeks   Status New               Plan - 07/30/15 0904    Clinical Impression Statement Pt presents with improved AROM knee flexion however decreased knee AROM extension. She reports she has been focusing on the flexion. Review of HEP with pt requiring increased time due to pain. Rock blade used  to soften quad as well as retro grade massage and education on self massage, scar massage and desensitization to decrease pain.    PT Next Visit Plan assess response to HEP, knee stretching/mobiliztions, hip strengthening, modalities PRN        Problem List Patient Active Problem List   Diagnosis Date Noted  . Panic disorder 07/23/2015  . Rheumatoid arthritis of right knee 06/18/2015  . Status post total right knee replacement 06/18/2015  . Tachycardia 04/04/2015  . 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 Tingling 03/15/2015  . Malnutrition of moderate degree ([place]) 09/20/2014  . Rheumatoid arthritis flare ([place]) 09/18/2014  . Hypokalemia 09/18/2014  . Loss of weight 09/18/2014  . Anorexia 09/18/2014  . Anemia 09/18/2014  . TROCHANTERIC BURSITIS, RIGHT 05/20/2010  . VITAMIN D DEFICIENCY 02/16/2010  . COUGH 02/04/2010  . RHEUMATOID ARTHRITIS 09/15/2009  . DEGENERATIVE JOINT DISEASE, GENERALIZED 07/28/2007    Dorene Ar, PTA 07/31/2015, 9:25 AM  [place], [place], [address] Phone: [phone]   Fax:  [phone]

## 2015-08-13 ENCOUNTER — Ambulatory Visit: Payer: Medicaid Other | Admitting: Physical Therapy

## 2015-08-27 ENCOUNTER — Encounter: Payer: Self-pay | Admitting: Family Medicine

## 2015-08-27 ENCOUNTER — Ambulatory Visit (INDEPENDENT_AMBULATORY_CARE_PROVIDER_SITE_OTHER): Payer: Medicaid Other | Admitting: Family Medicine

## 2015-08-27 ENCOUNTER — Ambulatory Visit: Payer: Medicaid Other | Admitting: Physical Therapy

## 2015-08-27 ENCOUNTER — Encounter: Payer: Medicaid Other | Admitting: Physical Therapy

## 2015-08-27 VITALS — BP 103/65 | HR 79 | Temp 98.3°F | Ht 64.0 in | Wt 160.0 lb

## 2015-08-27 DIAGNOSIS — F410 Panic disorder [episodic paroxysmal anxiety] without agoraphobia: Secondary | ICD-10-CM

## 2015-08-27 MED ORDER — FERROUS SULFATE 324 (65 FE) MG PO TBEC: 324.0000 mg | DELAYED_RELEASE_TABLET | Freq: Every day | ORAL | Status: DC

## 2015-08-27 NOTE — Patient Instructions (Signed)
I'm glad that you're doing well. Continue the Celexa Follow up with [place] in 1 month or sooner as needed.   Panic Attacks Panic attacks are sudden, short-livedsurges of severe anxiety, fear, or discomfort. They may occur for no reason when you are relaxed, when you are anxious, or when you are sleeping. Panic attacks may occur for a number of reasons:   Healthy people occasionally have panic attacks in extreme, life-threatening situations, such as war or natural disasters. Normal anxiety is a protective mechanism of the body that helps [place] react to danger (fight or flight response).  Panic attacks are often seen with anxiety disorders, such as panic disorder, social anxiety disorder, generalized anxiety disorder, and phobias. Anxiety disorders cause excessive or uncontrollable anxiety. They may interfere with your relationships or other life activities.  Panic attacks are sometimes seen with other mental illnesses, such as depression and posttraumatic stress disorder.  Certain medical conditions, prescription medicines, and drugs of abuse can cause panic attacks. SYMPTOMS  Panic attacks start suddenly, peak within 20 minutes, and are accompanied by four or more of the following symptoms:  Pounding heart or fast heart rate (palpitations).  Sweating.  Trembling or shaking.  Shortness of breath or feeling smothered.  Feeling choked.  Chest pain or discomfort.  Nausea or strange feeling in your stomach.  Dizziness, light-headedness, or feeling like you will faint.  Chills or hot flushes.  Numbness or tingling in your lips or hands and feet.  Feeling that things are not real or feeling that you are not yourself.  Fear of losing control or going crazy.  Fear of dying. Some of these symptoms can mimic serious medical conditions. For example, you may think you are having a heart attack. Although panic attacks can be very scary, they are not life threatening. DIAGNOSIS  Panic attacks  are diagnosed through an assessment by your health care provider. Your health care provider will ask questions about your symptoms, such as where and when they occurred. Your health care provider will also ask about your medical history and use of alcohol and drugs, including prescription medicines. Your health care provider may order blood tests or other studies to rule out a serious medical condition. Your health care provider may refer you to a mental health professional for further evaluation. TREATMENT   Most healthy people who have one or two panic attacks in an extreme, life-threatening situation will not require treatment.  The treatment for panic attacks associated with anxiety disorders or other mental illness typically involves counseling with a mental health professional, medicine, or a combination of both. Your health care provider will help determine what treatment is best for you.  Panic attacks due to physical illness usually go away with treatment of the illness. If prescription medicine is causing panic attacks, talk with your health care provider about stopping the medicine, decreasing the dose, or substituting another medicine.  Panic attacks due to alcohol or drug abuse go away with abstinence. Some adults need professional help in order to stop drinking or using drugs. HOME CARE INSTRUCTIONS   Take all medicines as directed by your health care provider.   Schedule and attend follow-up visits as directed by your health care provider. It is important to keep all your appointments. SEEK MEDICAL CARE IF:  You are not able to take your medicines as prescribed.  Your symptoms do not improve or get worse. SEEK IMMEDIATE MEDICAL CARE IF:   You experience panic attack symptoms that are different than  your usual symptoms.  You have serious thoughts about hurting yourself or others.  You are taking medicine for panic attacks and have a serious side effect. MAKE SURE  YOU:  Understand these instructions.  Will watch your condition.  Will get help right away if you are not doing well or get worse.   This information is not intended to replace advice given to you by your health care provider. Make sure you discuss any questions you have with your health care provider.   Document Released: 10/12/2005 Document Revised: 10/17/2013 Document Reviewed: 05/26/2013 Elsevier Interactive Patient Education [employer]

## 2015-08-27 NOTE — Progress Notes (Signed)
Patient ID: Kathy [name], female   DOB: [birth year], 49 y.o.   MRN: [id number]    Subjective: CC: f/u panic attacks HPI: Patient is a 49 y.o. female with a past medical history of RA, anemia, DJD  presenting to clinic today for a follow up on panic attacks.    Panic disorder: Was prescribed citalopram 10mg  daily a month ago. Initially states she was not happy and doesn't feel things have been going well since the "last doctor changed the prescription from what I was giving her." I have never treated the patient for GAD or panic attacks. When I dig further she seems to mean the lorazepam that she was prescribed from the ED. I note that I have never prescribed this medication for her. She denies any panic attacks since citalopram was started, when pointing out how great this is she does seem surprised by how well she's done.  She's sleeping okay, has some pain from RA and recent knee surgery that does interfere. Feels general anxiety has improved significantly since starting Celexa.   GAD 7 score 6, somewhat difficult  Now seeing Dr. with rheumatology for RA (Dr. Kathi Ludwig moved). She's also undergoing physical therapy today as she is s/p total R knee surgery  Social History: never smoker  ROS: All other systems reviewed and are negative.  Past Medical History Patient Active Problem List   Diagnosis Date Noted  . Panic disorder 07/23/2015  . Rheumatoid arthritis of right knee 06/18/2015  . Status post total right knee replacement 06/18/2015  . Tachycardia 04/04/2015  . 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 Tingling 03/15/2015  . Malnutrition of moderate degree (HCC) 09/20/2014  . Rheumatoid arthritis flare (HCC) 09/18/2014  . Hypokalemia 09/18/2014  . Loss of weight 09/18/2014  . Anorexia 09/18/2014  . Anemia 09/18/2014  . TROCHANTERIC BURSITIS, RIGHT 05/20/2010  . VITAMIN D DEFICIENCY 02/16/2010  . COUGH 02/04/2010  . RHEUMATOID ARTHRITIS 09/15/2009  .  DEGENERATIVE JOINT DISEASE, GENERALIZED 07/28/2007    Medications- reviewed and updated Current Outpatient Prescriptions  Medication Sig Dispense Refill  . albuterol (PROVENTIL HFA;VENTOLIN HFA) 108 (90 BASE) MCG/ACT inhaler Inhale 1-2 puffs into the lungs every 6 (six) hours as needed for wheezing or shortness of breath.    . citalopram (CELEXA) 10 MG tablet Take 1-2 tablets (10-20 mg total) by mouth daily. Start 1 tab daily for 1-2 weeks, if not effective, increase to 2 tabs (20mg ) daily. 60 tablet 1  . ferrous sulfate 324 (65 FE) MG TBEC Take 1 tablet (324 mg total) by mouth daily. 30 tablet 2  . folic acid (FOLVITE) 1 MG tablet Take 1 mg by mouth daily.    . hydroxychloroquine (PLAQUENIL) 200 MG tablet Take 200 mg by mouth daily.    . Multiple Vitamins-Minerals (MULTIVITAMIN PO) Take 1 tablet by mouth daily.    09/27/2007 omeprazole (PRILOSEC) 20 MG capsule Take 20 mg by mouth daily.    oxyCODONE (ROXICODONE) 5 MG immediate release tablet Take 1-2 tablets (5-10 mg total) by mouth every 4 (four) hours as needed for severe pain. 60 tablet 0  . predniSONE (DELTASONE) 5 MG tablet Take 5 mg by mouth 2 (two) times daily with a meal.     . ranitidine (ZANTAC) 150 MG tablet Take 150 mg by mouth 2 (two) times daily.    . ranitidine (ZANTAC) 150 MG tablet TAKE ONE (1) TABLET BY MOUTH TWO (2) TIMES DAILY AS NEEDED 60 tablet 2  . rivaroxaban (XARELTO) 10 MG  TABS tablet Take 1 tablet (10 mg total) by mouth daily with breakfast. 12 tablet 0  . senna (SENOKOT) 8.6 MG TABS tablet Take 1 tablet (8.6 mg total) by mouth daily as needed for mild constipation. 120 each 0  . sulfaSALAzine (AZULFIDINE) 500 MG EC tablet Take 1,000 mg by mouth 2 (two) times daily.    Marland Kitchen tiZANidine (ZANAFLEX) 4 MG tablet Take 1 tablet (4 mg total) by mouth every 6 (six) hours as needed for muscle spasms. 60 tablet 0  . traMADol (ULTRAM) 50 MG tablet Take 50 mg by mouth every 6 (six) hours as needed (pain).    . valACYclovir (VALTREX) 1000  MG tablet Take 1 tablet (1,000 mg total) by mouth 3 (three) times daily. 30 tablet 0   No current facility-administered medications for this visit.    Objective: Office vital signs reviewed. BP 103/65 mmHg  Pulse 79  Temp(Src) 98.3 F (36.8 C) (Oral)  Ht 5\' 4"  (1.626 m)  Wt 160 lb (72.576 kg)  BMI 27.45 kg/m2   Physical Examination:  General: Awake, alert, well- nourished, NAD Cardio: RRR, no m/r/g noted.  Pulm: No increased WOB.  CTAB, without wheezes, rhonchi or crackles noted.  MSK: Well healed scar over R knee without erythema. No calf size discrepancy or Bakers cyst noted.  Skin: dry, intact, no rashes or lesions.  Neuro: Strength and sensation grossly intact.  Psych: Appropriate mood and affect. No SI/HI, or AVH. Does not appear anxious.   Assessment/Plan: Panic disorder After discussing while lorazepam is good for stopping a panic attack, the goal is to prevent them in the first place which it seems the citalopram has been doing well with.  The patient does note significant stressors in her life at that time (such as family/friend deaths). I am less concerned about seeking behavior after talking with her today however she was not accompanied by her sister who may have been a driving force.  - continue citalopram 10mg  daily - was seen with integrated care, Alex, who provided behavioral health counseling if wanted in the future.  - RTC in 4 weeks to assess response, at that time may consider UDS to ensure the patient is not receiving benzos from an different outlet after concerns from previous MD.     No orders of the defined types were placed in this encounter.    Meds ordered this encounter  Medications  . ferrous sulfate 324 (65 FE) MG TBEC    Sig: Take 1 tablet (324 mg total) by mouth daily.    Dispense:  30 tablet    Refill:  2    PGY-2, [facility]\

## 2015-08-27 NOTE — Assessment & Plan Note (Signed)
After discussing while lorazepam is good for stopping a panic attack, the goal is to prevent them in the first place which it seems the citalopram has been doing well with.  The patient does note significant stressors in her life at that time (such as family/friend deaths). I am less concerned about seeking behavior after talking with her today however she was not accompanied by her sister who may have been a driving force.  - continue citalopram 10mg  daily - was seen with integrated care, Alex, who provided behavioral health counseling if wanted in the future.  - RTC in 4 weeks to assess response, at that time may consider UDS to ensure the patient is not receiving benzos from an different outlet after concerns from previous MD.

## 2015-09-10 ENCOUNTER — Ambulatory Visit: Payer: Medicaid Other | Attending: Orthopaedic Surgery | Admitting: Physical Therapy

## 2015-09-10 DIAGNOSIS — R29898 Other symptoms and signs involving the musculoskeletal system: Secondary | ICD-10-CM

## 2015-09-10 DIAGNOSIS — M25661 Stiffness of right knee, not elsewhere classified: Secondary | ICD-10-CM

## 2015-09-10 DIAGNOSIS — R269 Unspecified abnormalities of gait and mobility: Secondary | ICD-10-CM

## 2015-09-10 DIAGNOSIS — M25461 Effusion, right knee: Secondary | ICD-10-CM | POA: Diagnosis present

## 2015-09-10 DIAGNOSIS — M25561 Pain in right knee: Secondary | ICD-10-CM | POA: Insufficient documentation

## 2015-09-10 NOTE — Patient Instructions (Signed)
   Zelina Jimerson PT, DPT, LAT, ATC   Outpatient Rehabilitation Phone: [phone]     

## 2015-09-10 NOTE — Therapy (Signed)
Kathy [name], [place], [address] Phone: [phone]   Fax:  [phone]  Physical Therapy Treatment / Re-certification  Patient Details  Name: Kathy [name] MRN: [id number] Date of Birth: 1966/02/07 Referring Provider: Dr. Mcarthur Rossetti  Encounter Date: 09/10/2015      PT End of Session - 09/10/15 1926    Visit Number 3   Number of Visits 5   Date for PT Re-Evaluation 10/22/15   Authorization Type Medicaid    Authorization - Visit Number 2   Authorization - Number of Visits 4   PT Start Time 1415   PT Stop Time 1500   PT Time Calculation (min) 45 min   Activity Tolerance Patient tolerated treatment well   Behavior During Therapy [facility] for tasks assessed/performed      Past Medical History  Diagnosis Date  . Rheumatoid arteritis   . Asthma     seasonal  . GERD (gastroesophageal reflux disease)     Past Surgical History  Procedure Laterality Date  . Abdominal hysterectomy    . Total knee arthroplasty Right 06/18/2015    Procedure: RIGHT TOTAL KNEE ARTHROPLASTY;  Surgeon: Mcarthur Rossetti, MD;  Location: [place];  Service: Orthopedics;  Laterality: Right;    There were no vitals filed for this visit.  Visit Diagnosis:  Right knee pain - Plan: PT plan of care cert/re-cert  Knee swelling, right - Plan: PT plan of care cert/re-cert  Weakness of right leg - Plan: PT plan of care cert/re-cert  Decreased ROM of right knee - Plan: PT plan of care cert/re-cert  Abnormality of gait - Plan: PT plan of care cert/re-cert      Subjective Assessment - 09/10/15 1423    Subjective " I feel like I am able to get more bending out of my knee and have been keeping up with my HEP"   Currently in Pain? Yes   Pain Score 4    Pain Location Knee   Pain Orientation Right   Pain Descriptors / Indicators Aching;Burning   Pain Type Surgical pain   Pain Onset More than a month ago   Pain Frequency  Constant   Aggravating Factors  walking, standing bending   Pain Relieving Factors pain meds, ice            [facility] PT Assessment - 09/10/15 1429    Assessment   Medical Diagnosis R TKA   Referring Provider Dr. Mcarthur Rossetti   AROM   Right Knee Extension -30   Right Knee Flexion 95   Strength   Right/Left Knee Right   Right Knee Flexion 3+/5  in available ROM   Right Knee Extension 3+/5  in available ROM                     [facility] Adult PT Treatment/Exercise - 09/10/15 1424    Knee/Hip Exercises: Stretches   Passive Hamstring Stretch 2 reps;30 seconds   Quad Stretch Right;5 reps;20 seconds  with strap   Knee/Hip Exercises: Aerobic   Nustep L5 x 6 min   Knee/Hip Exercises: Seated   Long Arc Quad AROM;Strengthening;Right;2 sets;15 reps   Long Arc Quad Weight 4 lbs.   Sit to Sand 10 reps  2 x with table lowered between sets   Knee/Hip Exercises: Supine   Quad Sets 20 reps  with manual overpressure to increase extension   Heel Slides 10 reps   Straight Leg Raises 15 reps;Strengthening;AROM;Right;1  set   Manual Therapy   Manual Therapy Myofascial release   Soft tissue mobilization cross friction massage over proximal incision, retrograde/anterograde massage   Myofascial Release manual trigger point release over R Quad                PT Education - 09/10/15 1926    Education provided Yes   Education Details updated HEP    Person(s) Educated Patient   Methods Explanation   Comprehension Verbalized understanding          PT Short Term Goals - 09/10/15 1934    PT SHORT TERM GOAL #1   Title pt will be I with inital HEP (08/09/2015)   Baseline requires cues   Time 4   Period Weeks   Status Achieved   PT SHORT TERM GOAL #2   Title pt will increase knee mobility by > 15 degrees in all planes to assist with functional progression (08/09/2015)   Baseline right knee AROM ext -48 and flexion 68 degees   Time 4   Period Weeks   Status  Achieved   PT SHORT TERM GOAL #3   Title pt will be able to verbalie and demonstrate techniques to reduce right knee swelling and pain via RICE and HEP (08/09/2015)   Baseline no previous knowledge on how to control pain/inflammation   Time 4   Period Weeks   Status Achieved   PT SHORT TERM GOAL #4   Title pt will be able to stand for >10 minutes with < 5/10 pain to help promote functional endurance (08/09/2015)   Baseline pt reports being able to stand for 5 min   Time 4   Period Weeks   Status Achieved           PT Long Term Goals - 09/10/15 1942    PT LONG TERM GOAL #1   Title pt will be I with all HEP given throughout therapy (09/04/2015)   Baseline no previous HEP    Time 8   Period Weeks   Status On-going   PT LONG TERM GOAL #2   Title pt will increase her knee flexion to > 90 degrees and extension to less than -15 degrees to assist with funcitonal and efficient gait (09/04/2015)   Baseline right knee AROM ext -38 and flexion 45 degees   Time 8   Period Weeks   Status Partially Met   PT LONG TERM GOAL #3   Title pt will demonstrate R knee strength to >4-/5 in her available ROM to assist with community ambulations and safety while walking (09/04/2015)   Baseline unable to assess right knee strength due to pain and limited ROM   Time 8   Period Weeks   Status On-going   PT LONG TERM GOAL #4   Title pt will be able to walk/stand > 30 minutes with LRAD with < 3/10 pain to assist with ADLS (75/03/4331)   Baseline standing 5 min, walking 10-15 min   Time 8   Period Weeks   Status On-going               Plan - 09/10/15 1927    Clinical Impression Statement Kathy [name] reports that shehas been consistent with her HEP and has made improvemement with her R knee mobility. she met all STG this visit. She continues to exhibit limited R knee flexion/ extension due to pain and tightness. She completed all exercises today with minimal complaint of pain that was fleeting following  exercise  has ceased. Plan to assess if can extend her date for treatment through medicaid due to missing some visits.   Pt will benefit from skilled therapeutic intervention in order to improve on the following deficits Abnormal gait;Decreased activity tolerance;Decreased balance;Decreased endurance;Decreased mobility;Difficulty walking;Decreased range of motion;Hypomobility;Increased edema;Decreased strength;Postural dysfunction;Improper body mechanics;Impaired sensation;Pain   Rehab Potential Good   PT Frequency Biweekly   PT Duration 6 weeks   PT Treatment/Interventions ADLs/Self Care Home Management;Cryotherapy;Electrical Stimulation;Iontophoresis 4mg /ml Dexamethasone;Moist Heat;Ultrasound;Functional mobility training;Therapeutic activities;Therapeutic exercise;Balance training;Neuromuscular re-education;Manual techniques;Passive range of motion;Scar mobilization;Dry needling;Taping;Vasopneumatic Device   PT Next Visit Plan  knee stretching/mobiliztions, hip strengthening, modalities PRN   PT Home Exercise Plan hamstring curls, knee extension, sit to stand,    Consulted and Agree with Plan of Care Patient        Problem List Patient Active Problem List   Diagnosis Date Noted  . Panic disorder 07/23/2015  . Rheumatoid arthritis of right knee 06/18/2015  . Status post total right knee replacement 06/18/2015  . Tachycardia 04/04/2015  . 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 Tingling 03/15/2015  . Malnutrition of moderate degree ([place]) 09/20/2014  . Rheumatoid arthritis flare ([place]) 09/18/2014  . Hypokalemia 09/18/2014  . Loss of weight 09/18/2014  . Anorexia 09/18/2014  . Anemia 09/18/2014  . TROCHANTERIC BURSITIS, RIGHT 05/20/2010  . VITAMIN D DEFICIENCY 02/16/2010  . COUGH 02/04/2010  . RHEUMATOID ARTHRITIS 09/15/2009  . DEGENERATIVE JOINT DISEASE, GENERALIZED 07/28/2007   Starr Lake PT, DPT, LAT, ATC  09/10/2015  7:54 PM    [facility] [address] [place], [place], [address] Phone: [phone]   Fax:  [phone]  Name: Kathy [name] MRN: [id number] Date of Birth: 09-Jun-1966

## 2015-09-18 ENCOUNTER — Other Ambulatory Visit: Payer: Self-pay | Admitting: *Deleted

## 2015-09-18 NOTE — Telephone Encounter (Signed)
I have never prescribed this patient tramadol, ortho did. She needs to call them for this medication or come into clinic for an evaluation.  Thanks, Joanna Puff, MD [facility] Family Medicine Resident  09/18/2015, 4:49 PM

## 2015-09-23 ENCOUNTER — Other Ambulatory Visit: Payer: Self-pay | Admitting: *Deleted

## 2015-09-23 MED ORDER — TIZANIDINE HCL 4 MG PO TABS: 4.0000 mg | ORAL_TABLET | Freq: Four times a day (QID) | ORAL | Status: DC | PRN

## 2015-09-23 MED ORDER — SENNA 8.6 MG PO TABS: 1.0000 | ORAL_TABLET | Freq: Every day | ORAL | Status: DC | PRN

## 2015-09-23 NOTE — Telephone Encounter (Signed)
Senna and Zanaflex sent to pharmacy.  Thanks, Joanna Puff, MD [facility] Family Medicine Resident  09/23/2015, 1:51 PM

## 2015-09-24 ENCOUNTER — Ambulatory Visit: Payer: Medicaid Other | Admitting: Physical Therapy

## 2015-09-24 DIAGNOSIS — M25561 Pain in right knee: Secondary | ICD-10-CM | POA: Diagnosis not present

## 2015-09-24 DIAGNOSIS — M25461 Effusion, right knee: Secondary | ICD-10-CM

## 2015-09-24 DIAGNOSIS — R29898 Other symptoms and signs involving the musculoskeletal system: Secondary | ICD-10-CM

## 2015-09-24 DIAGNOSIS — R269 Unspecified abnormalities of gait and mobility: Secondary | ICD-10-CM

## 2015-09-24 DIAGNOSIS — M25661 Stiffness of right knee, not elsewhere classified: Secondary | ICD-10-CM

## 2015-09-24 NOTE — Therapy (Signed)
[place], [place], [address] Phone: [phone]   Fax:  [phone]  Physical Therapy Treatment  Patient Details  Name: Kathy [name] MRN: [id number] Date of Birth: 06/22/1966 Referring Provider: Dr. Mcarthur Rossetti  Encounter Date: 09/24/2015      PT End of Session - 09/24/15 1428    Visit Number 4   Number of Visits 5   Date for PT Re-Evaluation 10/22/15   Authorization Type Medicaid    Authorization - Visit Number 3   Authorization - Number of Visits 4   PT Start Time 1344   PT Stop Time 1430   PT Time Calculation (min) 46 min   Activity Tolerance Patient tolerated treatment well   Behavior During Therapy [facility] for tasks assessed/performed      Past Medical History  Diagnosis Date  . Rheumatoid arteritis   . Asthma     seasonal  . GERD (gastroesophageal reflux disease)     Past Surgical History  Procedure Laterality Date  . Abdominal hysterectomy    . Total knee arthroplasty Right 06/18/2015    Procedure: RIGHT TOTAL KNEE ARTHROPLASTY;  Surgeon: Mcarthur Rossetti, MD;  Location: [place];  Service: Orthopedics;  Laterality: Right;    There were no vitals filed for this visit.  Visit Diagnosis:  Right knee pain  Knee swelling, right  Weakness of right leg  Decreased ROM of right knee  Abnormality of gait      Subjective Assessment - 09/24/15 1347    Subjective " things are going well, I am a little sore today but I think it is due to the rain"   Currently in Pain? Yes   Pain Score 6    Pain Location Knee   Pain Orientation Right   Pain Descriptors / Indicators Aching;Sore   Pain Onset More than a month ago   Pain Frequency Constant   Aggravating Factors  prolonged walking/ standing   Pain Relieving Factors pain medication, ice            [facility] PT Assessment - 09/24/15 0001    AROM   Right Knee Extension -29   Right Knee Flexion 102                      [facility] Adult PT Treatment/Exercise - 09/24/15 1356    Ambulation/Gait   Stairs Yes   Stair Management Technique Two rails;Alternating pattern;Other (comment)  pain with descending   Number of Stairs 24   Height of Stairs 4   Knee/Hip Exercises: Stretches   Active Hamstring Stretch 2 reps;30 seconds   Quad Stretch Right;5 reps;20 seconds   Knee/Hip Exercises: Aerobic   Nustep L6 x 10 min   Knee/Hip Exercises: Standing   Forward Step Up Both;2 sets;Step Height: 4";10 reps   Step Down Both;Step Height: 4";1 set;10 reps   Wall Squat 1 set;10 reps   Knee/Hip Exercises: Seated   Long Arc Quad AROM;Strengthening;Right;2 sets;15 reps   Sit to [employer] 10 reps   Knee/Hip Exercises: Supine   Short Arc Quad Sets AROM;Strengthening;Right;2 sets;10 reps   Heel Slides 10 reps   Straight Leg Raises  Heel raises 15 reps;Strengthening;AROM;Right;1 set  X 20 with bil handhold on counter for stability   Manual Therapy   Manual Therapy Joint mobilization   Joint Mobilization grade 3 tibofemoral mobs mobs A>P   Myofascial Release manual trigger point release over R Quad  PT Education - 09/24/15 1428    Education provided Yes   Education Details updated HEP   Person(s) Educated Patient   Methods Explanation   Comprehension Verbalized understanding          PT Short Term Goals - 09/10/15 1934    PT SHORT TERM GOAL #1   Title pt will be I with inital HEP (08/09/2015)   Baseline requires cues   Time 4   Period Weeks   Status Achieved   PT SHORT TERM GOAL #2   Title pt will increase knee mobility by > 15 degrees in all planes to assist with functional progression (08/09/2015)   Baseline right knee AROM ext -48 and flexion 68 degees   Time 4   Period Weeks   Status Achieved   PT SHORT TERM GOAL #3   Title pt will be able to verbalie and demonstrate techniques to reduce right knee swelling and pain via RICE and HEP (08/09/2015)    Baseline no previous knowledge on how to control pain/inflammation   Time 4   Period Weeks   Status Achieved   PT SHORT TERM GOAL #4   Title pt will be able to stand for >10 minutes with < 5/10 pain to help promote functional endurance (08/09/2015)   Baseline pt reports being able to stand for 5 min   Time 4   Period Weeks   Status Achieved           PT Long Term Goals - 09/10/15 1942    PT LONG TERM GOAL #1   Title pt will be I with all HEP given throughout therapy (09/04/2015)   Baseline no previous HEP    Time 8   Period Weeks   Status On-going   PT LONG TERM GOAL #2   Title pt will increase her knee flexion to > 90 degrees and extension to less than -15 degrees to assist with funcitonal and efficient gait (09/04/2015)   Baseline right knee AROM ext -38 and flexion 45 degees   Time 8   Period Weeks   Status Partially Met   PT LONG TERM GOAL #3   Title pt will demonstrate R knee strength to >4-/5 in her available ROM to assist with community ambulations and safety while walking (09/04/2015)   Baseline unable to assess right knee strength due to pain and limited ROM   Time 8   Period Weeks   Status On-going   PT LONG TERM GOAL #4   Title pt will be able to walk/stand > 30 minutes with LRAD with < 3/10 pain to assist with ADLS (12/31/486)   Baseline standing 5 min, walking 10-15 min   Time 8   Period Weeks   Status On-going               Plan - 09/24/15 1429    Clinical Impression Statement Kathy [name] arrived early to todays session. She continues to demonstrate improved AROM of the R knee with tightness noted at end ranges of flexion/ extension. She was able to do all exercises without complaint of increased pain or soreness except for descending the stairs. she stated that she felt pretty good after todays session. Discussed with pt that next visit is her last covered visit, and to bring in her exercises for review.    PT Next Visit Plan  knee stretching/mobiliztions,  hip strengthening, modalities PRN, balance, review goals, D/c next visit   PT Home Exercise Plan wall squats, step downs, corner  balance with tandem and SLS.    Consulted and Agree with Plan of Care Patient        Problem List Patient Active Problem List   Diagnosis Date Noted  . Panic disorder 07/23/2015  . Rheumatoid arthritis of right knee 06/18/2015  . Status post total right knee replacement 06/18/2015  . Tachycardia 04/04/2015  . 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 Tingling 03/15/2015  . Malnutrition of moderate degree ([place]) 09/20/2014  . Rheumatoid arthritis flare ([place]) 09/18/2014  . Hypokalemia 09/18/2014  . Loss of weight 09/18/2014  . Anorexia 09/18/2014  . Anemia 09/18/2014  . TROCHANTERIC BURSITIS, RIGHT 05/20/2010  . VITAMIN D DEFICIENCY 02/16/2010  . COUGH 02/04/2010  . RHEUMATOID ARTHRITIS 09/15/2009  . DEGENERATIVE JOINT DISEASE, GENERALIZED 07/28/2007   Starr Lake PT, DPT, LAT, ATC  09/24/2015  2:37 PM      [place] [facility] [address] [place], [place], [address] Phone: [phone]   Fax:  [phone]  Name: Kathy [name] MRN: [id number] Date of Birth: [birth year]

## 2015-09-24 NOTE — Patient Instructions (Signed)
   Dessirae Scarola PT, DPT, LAT, ATC  [place] [facility] Phone: [phone]     

## 2015-09-27 ENCOUNTER — Telehealth: Payer: Self-pay | Admitting: Family Medicine

## 2015-09-27 DIAGNOSIS — M069 Rheumatoid arthritis, unspecified: Secondary | ICD-10-CM

## 2015-09-27 NOTE — Telephone Encounter (Signed)
Pt would like to have a new referral for a different Rheumatologist. She is hoping to find one in the [facility]. jw

## 2015-09-29 NOTE — Telephone Encounter (Signed)
New referral made, our referral specialist will attempt to find a new rheumatologist.   Thanks, Joanna Puff, MD [facility] Family Medicine Resident  09/29/2015, 1:05 PM

## 2015-10-08 ENCOUNTER — Ambulatory Visit: Payer: Medicaid Other | Attending: Orthopaedic Surgery | Admitting: Physical Therapy

## 2015-10-08 DIAGNOSIS — R269 Unspecified abnormalities of gait and mobility: Secondary | ICD-10-CM | POA: Diagnosis present

## 2015-10-08 DIAGNOSIS — M25561 Pain in right knee: Secondary | ICD-10-CM | POA: Diagnosis present

## 2015-10-08 DIAGNOSIS — M25461 Effusion, right knee: Secondary | ICD-10-CM | POA: Diagnosis present

## 2015-10-08 DIAGNOSIS — R29898 Other symptoms and signs involving the musculoskeletal system: Secondary | ICD-10-CM

## 2015-10-08 DIAGNOSIS — M25661 Stiffness of right knee, not elsewhere classified: Secondary | ICD-10-CM

## 2015-10-08 NOTE — Therapy (Addendum)
[place], [place], [address] Phone: [phone]   Fax:  [phone]  Physical Therapy Treatment  Patient Details  Name: Kathy [name] MRN: [id number] Date of Birth: 08-05-1966 Referring Provider: Dr. Mcarthur Rossetti  Encounter Date: 10/08/2015      PT End of Session - 10/08/15 1412    Visit Number 5   Number of Visits 5   Date for PT Re-Evaluation 10/22/15   Authorization Type Medicaid    PT Start Time 0211   PT Stop Time 0240  requests to leave early for dentist appt   PT Time Calculation (min) 29 min      Past Medical History  Diagnosis Date  . Rheumatoid arteritis   . Asthma     seasonal  . GERD (gastroesophageal reflux disease)     Past Surgical History  Procedure Laterality Date  . Abdominal hysterectomy    . Total knee arthroplasty Right 06/18/2015    Procedure: RIGHT TOTAL KNEE ARTHROPLASTY;  Surgeon: Mcarthur Rossetti, MD;  Location: [facility];  Service: Orthopedics;  Laterality: Right;    There were no vitals filed for this visit.  Visit Diagnosis:  Right knee pain  Knee swelling, right  Weakness of right leg  Decreased ROM of right knee  Abnormality of gait          [facility] PT Assessment - 10/08/15 1416    AROM   Right Knee Extension -29   Right Knee Flexion 102   Strength   Right Knee Flexion 4+/5   Right Knee Extension 4+/5                     [facility] Adult PT Treatment/Exercise - 10/08/15 1416    Ambulation/Gait   Stairs Yes   Stair Management Technique One rail Right;Alternating pattern   Number of Stairs 24   Height of Stairs 6   Gait Comments no increased pain with stairs today. Also performed 10 6 inch step ups, 4 inch lateral step ups x 10 and step downs/back up x 10 with cues for knee alignment throughout.    Knee/Hip Exercises: Stretches   Active Hamstring Stretch 2 reps;20 seconds   Active Hamstring Stretch Limitations supine  and seated with quad set   Knee/Hip Exercises: Aerobic   Nustep L6 x 5 min   Knee/Hip Exercises: Standing   Other Standing Knee Exercises review of standing 3 way hip bilateral   Knee/Hip Exercises: Supine   Quad Sets 10 reps                  PT Short Term Goals - 09/10/15 1934    PT SHORT TERM GOAL #1   Title pt will be I with inital HEP (08/09/2015)   Baseline requires cues   Time 4   Period Weeks   Status Achieved   PT SHORT TERM GOAL #2   Title pt will increase knee mobility by > 15 degrees in all planes to assist with functional progression (08/09/2015)   Baseline right knee AROM ext -48 and flexion 68 degees   Time 4   Period Weeks   Status Achieved   PT SHORT TERM GOAL #3   Title pt will be able to verbalie and demonstrate techniques to reduce right knee swelling and pain via RICE and HEP (08/09/2015)   Baseline no previous knowledge on how to control pain/inflammation   Time 4   Period Weeks   Status Achieved  PT SHORT TERM GOAL #4   Title pt will be able to stand for >10 minutes with < 5/10 pain to help promote functional endurance (08/09/2015)   Baseline pt reports being able to stand for 5 min   Time 4   Period Weeks   Status Achieved           PT Long Term Goals - 10/08/15 1452    PT LONG TERM GOAL #1   Title pt will be I with all HEP given throughout therapy (09/04/2015)   Time 8   Period Weeks   Status Achieved   PT LONG TERM GOAL #2   Title pt will increase her knee flexion to > 90 degrees and extension to less than -15 degrees to assist with funcitonal and efficient gait (09/04/2015)   Baseline 29-102   Time 8   Period Weeks   Status Partially Met   PT LONG TERM GOAL #3   Title pt will demonstrate R knee strength to >4-/5 in her available ROM to assist with community ambulations and safety while walking (09/04/2015)   Time 8   Period Weeks   Status Achieved   PT LONG TERM GOAL #4   Title pt will be able to walk/stand > 30 minutes with  LRAD with < 3/10 pain to assist with ADLS (19/10/6604)   Time 8   Period Weeks   Status Not Met               Plan - 10/08/15 1414    Clinical Impression Statement Limited treatment today due to pt needing to leave early for another appointment. She is considering a manipulation to gain additional knee extension. Reviewed important knee extension exercises to focus on should she chose to have a manipulation. She feels rushed and wants to try on her own to straighten her knee. This is her last covered visit. Pain is less today 3-4/10. Walking/standing 30 minutes and pain increases to 6-7/10. She is independent with her HEP. Her ROM is unchanged since last visit. Her strength in 4+/5 right knee in available ROM.    PT Next Visit Plan DC this visit to HEP, possible referral to [facility] clinic by MD if pt has manipulation        Problem List Patient Active Problem List   Diagnosis Date Noted  . Panic disorder 07/23/2015  . Rheumatoid arthritis of right knee 06/18/2015  . Status post total right knee replacement 06/18/2015  . Tachycardia 04/04/2015  . 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 Tingling 03/15/2015  . Malnutrition of moderate degree ([place]) 09/20/2014  . Rheumatoid arthritis flare ([place]) 09/18/2014  . Hypokalemia 09/18/2014  . Loss of weight 09/18/2014  . Anorexia 09/18/2014  . Anemia 09/18/2014  . TROCHANTERIC BURSITIS, RIGHT 05/20/2010  . VITAMIN D DEFICIENCY 02/16/2010  . COUGH 02/04/2010  . RHEUMATOID ARTHRITIS 09/15/2009  . DEGENERATIVE JOINT DISEASE, GENERALIZED 07/28/2007    Dorene Ar, PTA 10/08/2015, 2:56 PM  [facility] [address] [place], [place], [address] Phone: [phone]   Fax:  [phone]  Name: Kathy [name] MRN: [id number] Date of Birth: February 09, 1966      PHYSICAL THERAPY DISCHARGE SUMMARY  Visits from Start of Care: 5  Current functional level related to  goals / functional outcomes: See goals   Remaining deficits: Limited Right knee AROM with both flexion/ extension. Mild defecit of strength within available ROM. Pain and tightness in the R knee, and limited endurance with prolonged  weight bearing activities.    Education / Equipment: HEP, theraband for strengthening, information regarding [place] clinic.   Plan: Patient agrees to discharge.  Patient goals were partially met. Patient is being discharged due to financial reasons.  ?????        Kristoffer Leamon PT, DPT, LAT, ATC  10/09/2015  7:47 AM

## 2015-10-09 ENCOUNTER — Encounter: Payer: Self-pay | Admitting: Family Medicine

## 2015-10-09 ENCOUNTER — Ambulatory Visit (INDEPENDENT_AMBULATORY_CARE_PROVIDER_SITE_OTHER): Payer: Medicaid Other | Admitting: Family Medicine

## 2015-10-09 VITALS — BP 95/67 | HR 93 | Temp 98.3°F | Ht 61.0 in | Wt 172.4 lb

## 2015-10-09 DIAGNOSIS — F410 Panic disorder [episodic paroxysmal anxiety] without agoraphobia: Secondary | ICD-10-CM

## 2015-10-09 DIAGNOSIS — M069 Rheumatoid arthritis, unspecified: Secondary | ICD-10-CM

## 2015-10-09 MED ORDER — TRAMADOL HCL 50 MG PO TABS: 50.0000 mg | ORAL_TABLET | Freq: Four times a day (QID) | ORAL | Status: DC | PRN

## 2015-10-09 MED ORDER — CITALOPRAM HYDROBROMIDE 10 MG PO TABS: 10.0000 mg | ORAL_TABLET | Freq: Every day | ORAL | Status: DC

## 2015-10-09 NOTE — Assessment & Plan Note (Signed)
Patient doing well post surgery. Stressed continuing stretches and exercises she learned at PT.

## 2015-10-09 NOTE — Assessment & Plan Note (Signed)
Doing much better on Celexa. Will continue this for now. In 6 months could consider a trial off to see if her panic disorders were bought on by something stressful during her life.

## 2015-10-09 NOTE — Progress Notes (Signed)
Patient ID: Kathy [name], female   DOB: 1966-03-27, 49 y.o.   MRN: [id number]    Subjective: CC: f/u RA HPI: Patient is a 49 y.o. female with a past medical history of RA, anemia, DJD  presenting to clinic today for a follow up on RA and panic disorder.    Rheumatoid arthritis: Dr. Nickola Major, now seeing Dr. Kathi Ludwig with rheumatology. Patient asked for referral recently to find another rheumatologist. Has been referred to another person however needs her old records from Dr. Toni Amend office. Still on Plaquenil,  prednisone, and sulfasalazine. Ran out of tramadol for her chronic pain. Had last appt with physical therapy yesterday as from her R knee surgery. Feels that she can appropriate do stretches at home without more PT.   Panic disorder: continues to do well on Celexa. No panic attacks. Feels less anxious.   Patient is going to see a dentist, Dr. Burr Medico who apparently needs records from all her providers per report. Suggested asking the front office to complete a request for her records.   Health maintenence: patient declines TDAP as she feels she's had it with her rheumatologist in the past.   Social History: never smoker  ROS: All other systems reviewed and are negative.  Past Medical History Patient Active Problem List   Diagnosis Date Noted  . Panic disorder 07/23/2015  . Rheumatoid arthritis of right knee 06/18/2015  . Status post total right knee replacement 06/18/2015  . Tachycardia 04/04/2015  . 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 Tingling 03/15/2015  . Malnutrition of moderate degree ([facility]) 09/20/2014  . Rheumatoid arthritis flare ([facility]) 09/18/2014  . Hypokalemia 09/18/2014  . Loss of weight 09/18/2014  . Anorexia 09/18/2014  . Anemia 09/18/2014  . TROCHANTERIC BURSITIS, RIGHT 05/20/2010  . VITAMIN D DEFICIENCY 02/16/2010  . COUGH 02/04/2010  . RHEUMATOID ARTHRITIS 09/15/2009  . DEGENERATIVE JOINT DISEASE, GENERALIZED 07/28/2007     Medications- reviewed and updated Current Outpatient Prescriptions  Medication Sig Dispense Refill  . albuterol (PROVENTIL HFA;VENTOLIN HFA) 108 (90 BASE) MCG/ACT inhaler Inhale 1-2 puffs into the lungs every 6 (six) hours as needed for wheezing or shortness of breath.    . citalopram (CELEXA) 10 MG tablet Take 1-2 tablets (10-20 mg total) by mouth daily. Start 1 tab daily for 1-2 weeks, if not effective, increase to 2 tabs (20mg ) daily. 60 tablet 1  . ferrous sulfate 324 (65 FE) MG TBEC Take 1 tablet (324 mg total) by mouth daily. 30 tablet 2  . folic acid (FOLVITE) 1 MG tablet Take 1 mg by mouth daily.    . hydroxychloroquine (PLAQUENIL) 200 MG tablet Take 200 mg by mouth daily.    . Multiple Vitamins-Minerals (MULTIVITAMIN PO) Take 1 tablet by mouth daily.    omeprazole (PRILOSEC) 20 MG capsule Take 20 mg by mouth daily.    . predniSONE (DELTASONE) 5 MG tablet Take 5 mg by mouth 2 (two) times daily with a meal.     . ranitidine (ZANTAC) 150 MG tablet Take 150 mg by mouth 2 (two) times daily.    . ranitidine (ZANTAC) 150 MG tablet TAKE ONE (1) TABLET BY MOUTH TWO (2) TIMES DAILY AS NEEDED 60 tablet 2  . senna (SENOKOT) 8.6 MG TABS tablet Take 1 tablet (8.6 mg total) by mouth daily as needed for mild constipation. 120 each 0  . sulfaSALAzine (AZULFIDINE) 500 MG EC tablet Take 1,000 mg by mouth 2 (two) times daily.    Marland Kitchen tiZANidine (ZANAFLEX) 4 MG  tablet Take 1 tablet (4 mg total) by mouth every 6 (six) hours as needed for muscle spasms. 60 tablet 0  . traMADol (ULTRAM) 50 MG tablet Take 1 tablet (50 mg total) by mouth every 6 (six) hours as needed (pain). 30 tablet 0  . valACYclovir (VALTREX) 1000 MG tablet Take 1 tablet (1,000 mg total) by mouth 3 (three) times daily. 30 tablet 0   No current facility-administered medications for this visit.    Objective: Office vital signs reviewed. BP 95/67 mmHg  Pulse 93  Temp(Src) 98.3 F (36.8 C) (Oral)  Ht 5\' 1"  (1.549 m)  Wt 172 lb 6.4 oz  (78.2 kg)  BMI 32.59 kg/m2   Physical Examination:  General: Awake, alert, well- nourished, pleasant in NAD HEENT: Very poor dentition.  Cardio: RRR, no m/r/g noted. No pitting edema Pulm: No increased WOB.  CTAB, without wheezes, rhonchi or crackles noted.  MSK: Well healed scar over R knee without erythema or warmth. No calf size discrepancy or Bakers cyst noted.  Skin: dry, intact, no rashes or other lesions.  Neuro: Strength 5/5 in knee flexion, 4/5 in knee extension. 5/5 in hip flexion and extension bilaterally.   Assessment/Plan: RHEUMATOID ARTHRITIS Per patient, in transition between rheumatologist. Instructed to continue to f/u with Dr. for now until she can get established somewhere else. - Rx for tramadol (rheum normally does this but given she's in transition I will resume this for now).   Rheumatoid arthritis of right knee Patient doing well post surgery. Stressed continuing stretches and exercises she learned at PT.  Panic disorder Doing much better on Celexa. Will continue this for now. In 6 months could consider a trial off to see if her panic disorders were bought on by something stressful during her life.     Orders Placed This Encounter  Procedures  . Comprehensive metabolic panel    Order Specific Question:  Has the patient fasted?    Answer:  No  . CBC    Meds ordered this encounter  Medications  . traMADol (ULTRAM) 50 MG tablet    Sig: Take 1 tablet (50 mg total) by mouth every 6 (six) hours as needed (pain).    Dispense:  30 tablet    Refill:  0  . citalopram (CELEXA) 10 MG tablet    Sig: Take 1-2 tablets (10-20 mg total) by mouth daily. Start 1 tab daily for 1-2 weeks, if not effective, increase to 2 tabs (20mg ) daily.    Dispense:  60 tablet    Refill:  1    Kathi Ludwig PGY-2, [facility]

## 2015-10-09 NOTE — Patient Instructions (Addendum)
It was good to see you again.  I have referred you to a new rheumatologist, Dr. Corliss Skains. Let them know at the front office that you need a copy of the most recent progress note from me for the dentist (it will be done this evening).  It looks like you are up to date on all your other immunizations. Please follow up with me in 3 months or sooner as needed.

## 2015-10-09 NOTE — Assessment & Plan Note (Signed)
Per patient, in transition between rheumatologist. Instructed to continue to f/u with Dr. Kathi Ludwig for now until she can get established somewhere else. - Rx for tramadol (rheum normally does this but given she's in transition I will resume this for now).

## 2015-10-10 ENCOUNTER — Encounter: Payer: Self-pay | Admitting: Family Medicine

## 2015-10-10 LAB — COMPREHENSIVE METABOLIC PANEL
ALT: 16 U/L (ref 6–29)
AST: 18 U/L (ref 10–35)
Albumin: 4 g/dL (ref 3.6–5.1)
Alkaline Phosphatase: 76 U/L (ref 33–115)
BILIRUBIN TOTAL: 0.4 mg/dL (ref 0.2–1.2)
BUN: 13 mg/dL (ref 7–25)
CALCIUM: 9.2 mg/dL (ref 8.6–10.2)
CO2: 27 mmol/L (ref 20–31)
Chloride: 104 mmol/L (ref 98–110)
Creat: 0.65 mg/dL (ref 0.50–1.10)
GLUCOSE: 77 mg/dL (ref 65–99)
POTASSIUM: 4 mmol/L (ref 3.5–5.3)
Sodium: 138 mmol/L (ref 135–146)
Total Protein: 8.1 g/dL (ref 6.1–8.1)

## 2015-10-10 LAB — CBC
HEMATOCRIT: 34.6 % — AB (ref 36.0–46.0)
HEMOGLOBIN: 11.3 g/dL — AB (ref 12.0–15.0)
MCH: 30.1 pg (ref 26.0–34.0)
MCHC: 32.7 g/dL (ref 30.0–36.0)
MCV: 92.3 fL (ref 78.0–100.0)
MPV: 9.8 fL (ref 8.6–12.4)
PLATELETS: 342 10*3/uL (ref 150–400)
RBC: 3.75 MIL/uL — ABNORMAL LOW (ref 3.87–5.11)
RDW: 14.8 % (ref 11.5–15.5)
WBC: 7.9 10*3/uL (ref 4.0–10.5)

## 2015-10-22 ENCOUNTER — Other Ambulatory Visit: Payer: Self-pay | Admitting: Family Medicine

## 2015-10-22 ENCOUNTER — Encounter: Payer: Medicaid Other | Admitting: Physical Therapy

## 2015-10-22 MED ORDER — TRAMADOL HCL 50 MG PO TABS: 50.0000 mg | ORAL_TABLET | Freq: Four times a day (QID) | ORAL | Status: DC | PRN

## 2015-10-22 NOTE — Telephone Encounter (Signed)
Pt called because she lost her prescription for Tramadol and would like a new one faxed to her pharmacy. She is also getting some medications from her old Rheumatologist and would like [place] to refill those medications. She didn't know the proper names of the medication and would like to speak to a nurse to discuss. jw

## 2015-10-22 NOTE — Telephone Encounter (Signed)
Called in refill for tramadol for [facility]. Will not do this again for a controlled substance. Please let the patient know that I cannot refill those rheumatology medications.  Thanks, Joanna Puff, MD [facility] Resident  10/22/2015, 5:17 PM

## 2015-10-23 NOTE — Telephone Encounter (Signed)
Pt informed. Deseree Blount, CMA  

## 2015-10-31 ENCOUNTER — Encounter: Payer: Self-pay | Admitting: Family Medicine

## 2015-10-31 ENCOUNTER — Ambulatory Visit (INDEPENDENT_AMBULATORY_CARE_PROVIDER_SITE_OTHER): Payer: Medicaid Other | Admitting: Family Medicine

## 2015-10-31 VITALS — BP 124/51 | HR 84 | Temp 97.6°F | Ht 64.0 in | Wt 174.0 lb

## 2015-10-31 DIAGNOSIS — M0579 Rheumatoid arthritis with rheumatoid factor of multiple sites without organ or systems involvement: Secondary | ICD-10-CM

## 2015-10-31 MED ORDER — OMEPRAZOLE 20 MG PO CPDR: 20.0000 mg | DELAYED_RELEASE_CAPSULE | Freq: Every day | ORAL | Status: DC

## 2015-10-31 NOTE — Patient Instructions (Signed)
It was good to see you again. I have sent the prescription for the omeprazole to the pharmacy. Please let me know if I can help you find a new rheumatologist.

## 2015-10-31 NOTE — Progress Notes (Signed)
Patient ID: Kathy [name], female   DOB: 09-Sep-1966, 50 y.o.   MRN: [id number]    Subjective: CC: f/u RA HPI: Patient is a 50 y.o. female with a past medical history of RA, anemia, DJD  presenting to clinic today because she states she was told to by the front office (we had discussed f/u in 3 months from her previous visit).  No new complaints.    Rheumatoid arthritis: Was seeing Dr. Nickola Major who left, Dr. Kathi Ludwig took over however the patient does not wish to f/u with him. Still waiting to see a new rheumatologist, she's waiting on an appt. She's been exercising which helps with the pain. She has not needed the tramadol that was previously prescribed. She has appt with Dr. Magnus Ivan on the 9th with ortho.  Still on Plaquenil,  prednisone, and sulfasalazine.   Panic disorder: continues to do well on Celexa. No panic attacks. No side effects from the medication.   Social History: never smoker  ROS: All other systems reviewed and are negative.  Past Medical History Patient Active Problem List   Diagnosis Date Noted  . Panic disorder 07/23/2015  . Rheumatoid arthritis of right knee 06/18/2015  . Status post total right knee replacement 06/18/2015  . Tachycardia 04/04/2015  . 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 Tingling 03/15/2015  . Malnutrition of moderate degree ([facility]) 09/20/2014  . Rheumatoid arthritis flare ([facility]) 09/18/2014  . Hypokalemia 09/18/2014  . Loss of weight 09/18/2014  . Anorexia 09/18/2014  . Anemia 09/18/2014  . TROCHANTERIC BURSITIS, RIGHT 05/20/2010  . VITAMIN D DEFICIENCY 02/16/2010  . COUGH 02/04/2010  . RHEUMATOID ARTHRITIS 09/15/2009  . DEGENERATIVE JOINT DISEASE, GENERALIZED 07/28/2007    Medications- reviewed and updated Current Outpatient Prescriptions  Medication Sig Dispense Refill  . albuterol (PROVENTIL HFA;VENTOLIN HFA) 108 (90 BASE) MCG/ACT inhaler Inhale 1-2 puffs into the lungs every 6 (six) hours as needed for wheezing or  shortness of breath.    . citalopram (CELEXA) 10 MG tablet Take 1-2 tablets (10-20 mg total) by mouth daily. Start 1 tab daily for 1-2 weeks, if not effective, increase to 2 tabs (20mg ) daily. 60 tablet 1  . ferrous sulfate 324 (65 FE) MG TBEC Take 1 tablet (324 mg total) by mouth daily. 30 tablet 2  . folic acid (FOLVITE) 1 MG tablet Take 1 mg by mouth daily.    . hydroxychloroquine (PLAQUENIL) 200 MG tablet Take 200 mg by mouth daily.    . Multiple Vitamins-Minerals (MULTIVITAMIN PO) Take 1 tablet by mouth daily.    omeprazole (PRILOSEC) 20 MG capsule Take 1 capsule (20 mg total) by mouth daily. 90 capsule 1  . predniSONE (DELTASONE) 5 MG tablet Take 5 mg by mouth 2 (two) times daily with a meal.     . ranitidine (ZANTAC) 150 MG tablet Take 150 mg by mouth 2 (two) times daily.    . ranitidine (ZANTAC) 150 MG tablet TAKE ONE (1) TABLET BY MOUTH TWO (2) TIMES DAILY AS NEEDED 60 tablet 2  . senna (SENOKOT) 8.6 MG TABS tablet Take 1 tablet (8.6 mg total) by mouth daily as needed for mild constipation. 120 each 0  . sulfaSALAzine (AZULFIDINE) 500 MG EC tablet Take 1,000 mg by mouth 2 (two) times daily.    Marland Kitchen tiZANidine (ZANAFLEX) 4 MG tablet Take 1 tablet (4 mg total) by mouth every 6 (six) hours as needed for muscle spasms. 60 tablet 0  . traMADol (ULTRAM) 50 MG tablet Take 1 tablet (50  mg total) by mouth every 6 (six) hours as needed (pain). 30 tablet 0  . valACYclovir (VALTREX) 1000 MG tablet Take 1 tablet (1,000 mg total) by mouth 3 (three) times daily. 30 tablet 0   No current facility-administered medications for this visit.    Objective: Office vital signs reviewed. BP 124/51 mmHg  Pulse 84  Temp(Src) 97.6 F (36.4 C) (Oral)  Ht 5\' 4"  (1.626 m)  Wt 174 lb (78.926 kg)  BMI 29.85 kg/m2   Physical Examination:  General: Awake, alert, well- nourished, pleasant in NAD.  Cardio: RRR, no m/r/g noted. No pitting edema Pulm: No increased WOB.  CTAB, without wheezes, rhonchi or crackles  noted.  MSK: Well healed scar over R knee without erythema or warmth. No calf size discrepancy or Bakers cyst noted.  Skin: dry, intact, no rashes or other lesions.  Neuro: Strength 5/5 in left knee flexion and extension, 4/5 in right knee flexion and extension. 5/5 in hip flexion and extension bilaterally.   Assessment/Plan: RHEUMATOID ARTHRITIS Patient doing well. Has f/u with ortho for continued knee pain. Still doing exercises and stretches that she learned from PT. - continue currently regimen - advised patient to contact our office if she has any issues with getting in with a rheumatologist. - f/u with me in 3 months or sooner as needed.     No orders of the defined types were placed in this encounter.    Meds ordered this encounter  Medications  . omeprazole (PRILOSEC) 20 MG capsule    Sig: Take 1 capsule (20 mg total) by mouth daily.    Dispense:  90 capsule    Refill:  1    PGY-2, [facility]

## 2015-11-01 NOTE — Assessment & Plan Note (Signed)
Patient doing well. Has f/u with ortho for continued knee pain. Still doing exercises and stretches that she learned from PT. - continue currently regimen - advised patient to contact our office if she has any issues with getting in with a rheumatologist. - f/u with me in 3 months or sooner as needed.

## 2015-12-13 ENCOUNTER — Other Ambulatory Visit: Payer: Self-pay | Admitting: Family Medicine

## 2015-12-13 NOTE — Telephone Encounter (Signed)
Refill on Citalopram and iron supplement.  Thanks, Joanna Puff, MD [facility] Resident  12/13/2015, 7:53 PM

## 2015-12-16 ENCOUNTER — Telehealth: Payer: Self-pay | Admitting: *Deleted

## 2015-12-16 ENCOUNTER — Telehealth: Payer: Self-pay | Admitting: Family Medicine

## 2015-12-16 NOTE — Telephone Encounter (Signed)
Pt called because the Rheumatologist we referred her too has declined to see her. She would like [place] to find another one for her. jw

## 2015-12-16 NOTE — Telephone Encounter (Signed)
Molly from [facility] called needing clarification on the Celexa.  Last Rx sent in from 10/2015 stated Take 1-2 tablets (10-20 mg total) by mouth daily. Start 1 tab daily for 1-2 weeks, if not effective, increase to 2 tabs (20mg ) daily.  Patient is now taking 2 tabs daily.  Most recent Rx sent in was for 1 tab daily.  Please clarify with [facility].  , RN

## 2015-12-17 NOTE — Telephone Encounter (Signed)
Tia,  Is there any other rheumatologist we can send this patient to?  Thanks, Joanna Puff, MD [facility] Resident  12/17/2015, 7:49 PM

## 2015-12-19 NOTE — Telephone Encounter (Signed)
Ms. Livingston would like to have a referral to a rheumatologist in [facility].

## 2015-12-19 NOTE — Telephone Encounter (Signed)
There are NO other rheumatologist in [place] that accept Medicaid. Will call patient to see if she would prefer [facility] or [place].

## 2015-12-19 NOTE — Telephone Encounter (Signed)
Referral sent to Dr. Herma Carson at [facility].

## 2015-12-19 NOTE — Telephone Encounter (Signed)
Molly from [facility] is calling again regarding the Celexa.  Please see previous message.  Clovis Pu, RN

## 2015-12-20 MED ORDER — CITALOPRAM HYDROBROMIDE 10 MG PO TABS: 20.0000 mg | ORAL_TABLET | Freq: Every day | ORAL | Status: DC

## 2015-12-20 NOTE — Telephone Encounter (Signed)
Called pharmacy. They were a little confused why I was calling stating that the patient picked up the Rx on 2/18. Patient increased Celexa to 20mg  daily. Sent in new Rx. Tia has already been notified about the patient's need for a rheumatologist and is in contact with her.  , MD [facility] Resident  12/20/2015, 11:44 AM

## 2015-12-26 ENCOUNTER — Telehealth: Payer: Self-pay | Admitting: Family Medicine

## 2015-12-26 NOTE — Telephone Encounter (Signed)
[facility], Dr. Sharmon Revere. She will see patient on Tuesday, Feb 25, 2016 at 12:15PM.  [facility] [address] [place], [place] [address] [phone]  Renard Hamper will call patient and advise her of this appt.

## 2015-12-26 NOTE — Telephone Encounter (Signed)
Left message on patient voicemail with appointment details.

## 2015-12-26 NOTE — Telephone Encounter (Signed)
Pt called and would like to speak to Dr. Leonides Schanz because the Rheumatologist said that they would not see her at a patient. She wants to know what to do since she is in pain and needs her medication. jw

## 2015-12-27 ENCOUNTER — Other Ambulatory Visit: Payer: Self-pay | Admitting: Family Medicine

## 2015-12-27 MED ORDER — PREDNISONE 5 MG PO TABS: 5.0000 mg | ORAL_TABLET | Freq: Two times a day (BID) | ORAL | Status: DC

## 2015-12-27 MED ORDER — METHOTREXATE (ANTI-RHEUMATIC) 2.5 MG PO TABS: 25.0000 mg | ORAL_TABLET | ORAL | Status: DC

## 2015-12-27 NOTE — Telephone Encounter (Signed)
Patient now has a new rheum appt scheduled for Tuesday, Feb 25, 2016 at 12:15PM with Dr. Sharmon Revere. Patient states she will need meds to last her until she can see her new rheum. Please advise.

## 2015-12-27 NOTE — Telephone Encounter (Signed)
Touched base with the patient's pharmacy. She was on Plaquenil 200mg  but last filled on 08/26/15.  She was also on Sulfasalazine 500mg  that was last refilled on 09/27/15. She was last on methotrexate 2.5mg  (10 tablets) once weekly last refilled on 10/31/15.  As she was only on methotrexate recently, this is the only one that I will refill. I will refill it x 4 weeks. She must come in for a CBC and CMET to assess LFTs in 4 weeks.   Thanks, 14/2/16, MD [facility] Resident  12/27/2015, 6:00 PM   ** I precepted this patient with Dr. Joanna Puff.

## 2015-12-30 ENCOUNTER — Other Ambulatory Visit: Payer: Self-pay | Admitting: Family Medicine

## 2015-12-30 NOTE — Telephone Encounter (Signed)
Left message on voicemail that methotrexate was called in and that she must schedule a lab appointment in 4 weeks. Asked that she call back with any questions.

## 2015-12-31 NOTE — Telephone Encounter (Signed)
As previously documented, patient has not had Plaquenil or sulfasalazine in several months. I ill not refill these medications. Refilled Zantac and zanaflex.  Joanna Puff, MD [facility] Resident  12/31/2015, 10:07 AM

## 2016-01-01 ENCOUNTER — Other Ambulatory Visit: Payer: Self-pay | Admitting: Family Medicine

## 2016-01-04 NOTE — Telephone Encounter (Signed)
Patient requires an appt for a refill of a controlled substance.  Thanks, Joanna Puff, MD [facility] Resident

## 2016-01-21 ENCOUNTER — Other Ambulatory Visit: Payer: Self-pay | Admitting: Family Medicine

## 2016-01-22 ENCOUNTER — Telehealth: Payer: Self-pay | Admitting: Family Medicine

## 2016-01-22 NOTE — Telephone Encounter (Signed)
As was discussed last month when I refilled her methothrexate (she had not had prescriptions filled for Plaquenil or sulfasalazine in >3 months so those were NOT prescribed myself). I also noted that the patient would need to follow up in 1 month prior to refilling all her medications because she needs a CBC with differential for that mediation.  Please let the patient know this. She does not have a pain contract with me but needs to be seen in our clinic and have labs prior to any medication refills.  I am unsure why my previous note was not saved in the patient's chart- will place a "telephone call" encounter stating this as well to ensure it is saved to her chart.  Thanks, Joanna Puff, MD [facility] Family Medicine Resident  01/22/2016, 10:11 AM

## 2016-01-22 NOTE — Telephone Encounter (Signed)
Needs refill on her arthritis pills-sulzidineAdministrator Pharmacy

## 2016-01-22 NOTE — Telephone Encounter (Signed)
Patient requested refill on sulfasalzine and tramadol. I have refused sulfsalazine and Plaquenil several times over the last 1.5 months:  As was discussed last month when I refilled her methothrexate (she had not had prescriptions filled for Plaquenil or sulfasalazine in >3 months so those were NOT prescribed myself). I also noted that the patient would need to follow up in 1 month prior to refilling all her medications because she needs a CBC with differential for that mediation.  Please let the patient know this. She does not have a pain contract with me but needs to be seen in our clinic and have labs prior to any medication refills.  I am unsure why my previous note was not saved in the patient's chart- will place a "telephone call" encounter stating this as well to ensure it is saved to her chart.  Thanks, Joanna Puff, MD [facility] Family Medicine Resident  01/22/2016, 10:11 AM

## 2016-01-29 ENCOUNTER — Other Ambulatory Visit: Payer: Self-pay | Admitting: Family Medicine

## 2016-01-29 ENCOUNTER — Telehealth: Payer: Self-pay | Admitting: Family Medicine

## 2016-01-29 NOTE — Telephone Encounter (Signed)
Received Rx requests for Plaquenil, sulfasalazine, and tramadol.  As was previously documented in a "patient call note" on 3/317, however I am unable to find saved to her chart, since she hadn't had sulfasalazine or Plaquenil refilled from her rheumatologist in quite some time and had been off these for >2 months and > 5 months, these medications were not prescribed. At the time of the methotrexate refill she was advised she must follow up in our clinic within 1 month to have a CBC with diff ordered.   As such, none of the above medications were prescribed today. Please have the patient make an appt with our clinic (if I am not available she may f/u with another provider as she is not a chronic pain patient with a contract at this time).  Thank you, Joanna Puff, MD [facility] Family Medicine Resident  01/29/2016, 11:29 AM

## 2016-01-29 NOTE — Telephone Encounter (Signed)
Left message on voicemail for patient to return call. 

## 2016-02-04 ENCOUNTER — Other Ambulatory Visit: Payer: Self-pay | Admitting: Family Medicine

## 2016-02-06 ENCOUNTER — Other Ambulatory Visit: Payer: Self-pay | Admitting: Family Medicine

## 2016-02-10 ENCOUNTER — Other Ambulatory Visit: Payer: Self-pay | Admitting: Family Medicine

## 2016-02-10 NOTE — Telephone Encounter (Signed)
Patient is asking for refill on her sulfa Saladine.  Won't see her rheumatologist until May 2nd.  Out of medication and need this for pain from her arthritis.

## 2016-02-10 NOTE — Telephone Encounter (Signed)
Refilled folic acid and prednisone x 1 month supply. Please have pt follow up in our clinic.  Thanks, [employer]

## 2016-02-10 NOTE — Telephone Encounter (Signed)
As was previously noted in telephone encounters, I have never prescribed sulfasalazine for this patient and will not do it as she had been off this medication for quite sometime prior to her requesting this medication from me in early March. I have prescribed her methotrexate and prednisone which are also for rheumatoid arthritis, however she has not followed up in clinic as indicated. Please ask the patient to follow up in our clinic as soon as possible so that we can get blood work (CBC with diff) to determine if we can continue to prescribe methotrexate.    Thank you, Joanna Puff, MD [facility] Family Medicine Resident  02/10/2016, 4:38 PM

## 2016-02-10 NOTE — Telephone Encounter (Signed)
Citalopram refilled.  Thanks, [employer]

## 2016-02-21 NOTE — Telephone Encounter (Signed)
Pt will be seeing dr Leonides Schanz next week for follow up appt. Deseree Bruna Potter, CMA

## 2016-02-25 ENCOUNTER — Encounter: Payer: Self-pay | Admitting: Family Medicine

## 2016-02-25 ENCOUNTER — Ambulatory Visit (INDEPENDENT_AMBULATORY_CARE_PROVIDER_SITE_OTHER): Payer: Medicaid Other | Admitting: Family Medicine

## 2016-02-25 VITALS — BP 132/94 | HR 74 | Temp 98.1°F | Ht 64.0 in | Wt 189.0 lb

## 2016-02-25 DIAGNOSIS — F410 Panic disorder [episodic paroxysmal anxiety] without agoraphobia: Secondary | ICD-10-CM | POA: Diagnosis present

## 2016-02-25 MED ORDER — TRAMADOL HCL 50 MG PO TABS: 50.0000 mg | ORAL_TABLET | Freq: Four times a day (QID) | ORAL | Status: DC | PRN

## 2016-02-25 MED ORDER — CITALOPRAM HYDROBROMIDE 10 MG PO TABS: 30.0000 mg | ORAL_TABLET | Freq: Every day | ORAL | Status: DC

## 2016-02-25 MED ORDER — LORAZEPAM 1 MG PO TABS: 1.0000 mg | ORAL_TABLET | Freq: Two times a day (BID) | ORAL | Status: DC | PRN

## 2016-02-25 NOTE — Progress Notes (Signed)
Patient ID: Kathy [name], female   DOB: 23-Jul-1966, 50 y.o.   MRN: [id number]    Subjective: CC: f/u RA HPI: Patient is a 50 y.o. female with a past medical history of RA, anemia, DJD  presenting to clinic today because she states she was told to by the front office (we had discussed f/u in 3 months from her previous visit).  No new complaints.    Rheumatoid arthritis: She saw Dr. Sharmon Revere yesterday and they did blood work.  She sees her back in 3 months and she will get an injection. She stopped sulfasalazine. She is still on Plaquenil, methrotrexate, and prednisone. Having significant pain in her hands and left knee. Needs a refill on tramadol.  Panic disorder: Having panic attacks daily starting last month. Patient notes several deaths and illness in the family. She has racing heart, difficulty catching breath, anxious, tachypneic. She can't tell me when her symptoms peak but it is completely resolved in 20 minutes. No chest pain during all this.  Previously she was on Lorazepam 1mg  PRN but was then controlled with Celexa 10-20mg  daily.   Social History: never smoker  ROS: All other systems reviewed and are negative besides that noted in HPI  Past Medical History Patient Active Problem List   Diagnosis Date Noted  . Panic disorder 07/23/2015  . Rheumatoid arthritis of right knee 06/18/2015  . Status post total right knee replacement 06/18/2015  . Tachycardia 04/04/2015  . 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 Tingling 03/15/2015  . Malnutrition of moderate degree (HCC) 09/20/2014  . Rheumatoid arthritis flare (HCC) 09/18/2014  . Hypokalemia 09/18/2014  . Loss of weight 09/18/2014  . Anorexia 09/18/2014  . Anemia 09/18/2014  . TROCHANTERIC BURSITIS, RIGHT 05/20/2010  . VITAMIN D DEFICIENCY 02/16/2010  . COUGH 02/04/2010  . Rheumatoid arthritis(714.0) 09/15/2009  . DEGENERATIVE JOINT DISEASE, GENERALIZED 07/28/2007    Medications- reviewed and  updated Current Outpatient Prescriptions  Medication Sig Dispense Refill  . albuterol (PROVENTIL HFA;VENTOLIN HFA) 108 (90 BASE) MCG/ACT inhaler Inhale 1-2 puffs into the lungs every 6 (six) hours as needed for wheezing or shortness of breath.    . citalopram (CELEXA) 10 MG tablet Take 3 tablets (30 mg total) by mouth daily. 90 tablet 3  . ferrous sulfate 325 (65 FE) MG tablet TAKE ONE (1) TABLET BY MOUTH EVERY DAY 90 tablet 0  . folic acid (FOLVITE) 1 MG tablet TAKE ONE (1) TABLET BY MOUTH EVERY DAY 30 tablet 0  . hydroxychloroquine (PLAQUENIL) 200 MG tablet Take 200 mg by mouth daily.    09/27/2007 LORazepam (ATIVAN) 1 MG tablet Take 1 tablet (1 mg total) by mouth every 12 (twelve) hours as needed for anxiety. 15 tablet 0  . methotrexate (RHEUMATREX) 2.5 MG tablet Take 10 tablets (25 mg total) by mouth once a week. 40 tablet 0  . Multiple Vitamins-Minerals (MULTIVITAMIN PO) Take 1 tablet by mouth daily.    Marland Kitchen omeprazole (PRILOSEC) 20 MG capsule Take 1 capsule (20 mg total) by mouth daily. 90 capsule 1  . predniSONE (DELTASONE) 5 MG tablet TAKE ONE (1) TABLET BY MOUTH TWO (2) TIMES DAILY WITH A MEAL 60 tablet 0  . ranitidine (ZANTAC) 150 MG tablet Take 150 mg by mouth 2 (two) times daily.    . ranitidine (ZANTAC) 150 MG tablet TAKE ONE (1) TABLET BY MOUTH TWO (2) TIMES DAILY AS NEEDED 60 tablet 1  . senna (SENOKOT) 8.6 MG TABS tablet Take 1 tablet (8.6 mg total) by mouth  daily as needed for mild constipation. 120 each 0  . tiZANidine (ZANAFLEX) 4 MG tablet TAKE ONE (1) TABLET BY MOUTH EVERY 6 HOURS AS NEEDED FOR MUSCLE SPASMS 60 tablet 0  . traMADol (ULTRAM) 50 MG tablet Take 1 tablet (50 mg total) by mouth every 6 (six) hours as needed (pain). 30 tablet 2   No current facility-administered medications for this visit.    Objective: Office vital signs reviewed. BP 132/94 mmHg  Pulse 74  Temp(Src) 98.1 F (36.7 C) (Oral)  Ht 5\' 4"  (1.626 m)  Wt 189 lb (85.73 kg)  BMI 32.43 kg/m2 Repeat  132/94  Physical Examination:  General: Awake, alert, well- nourished, pleasant in NAD.  Cardio: RRR, no m/r/g noted. No pitting edema Pulm: No increased WOB.  CTAB, without wheezes, rhonchi or crackles noted.  MSK: Well healed scar over R knee without erythema or warmth. No calf size discrepancy or Bakers cyst noted. Hands: ulnar deviation. Significant effusions and bony prominences noted at the MCP and PIP joints of the hands bilaterally.  Skin: dry, intact, no rashes or other lesions.  Neuro: Strength 5/5 in left knee flexion and extension, 4/5 in right knee flexion and extension. 5/5 in hip flexion and extension bilaterally.   Assessment/Plan: Rheumatoid arthritis(714.0) Patient has not established with rheumatology. Is currently off sulfasalazine.  - rx for tramadol  - continue to monitor - pt has f/u in 3 months with rheumatology.  Panic disorder Patient with increased frequency of panic disorders, most likely due to acute stressors. - advised to increase Cymbalta to 30mg  daily - PRN Lorazepam #15. Discussed that this was a short term thing as it is not indicated for long term and has addictive properties.  - f/u with me in 1 month if symptoms not improving, 3 months if everything is stable.    No orders of the defined types were placed in this encounter.    Meds ordered this encounter  Medications  . citalopram (CELEXA) 10 MG tablet    Sig: Take 3 tablets (30 mg total) by mouth daily.    Dispense:  90 tablet    Refill:  3  . traMADol (ULTRAM) 50 MG tablet    Sig: Take 1 tablet (50 mg total) by mouth every 6 (six) hours as needed (pain).    Dispense:  30 tablet    Refill:  2  . LORazepam (ATIVAN) 1 MG tablet    Sig: Take 1 tablet (1 mg total) by mouth every 12 (twelve) hours as needed for anxiety.    Dispense:  15 tablet    Refill:  0    PGY-2, [facility] Family Medicine

## 2016-02-25 NOTE — Patient Instructions (Addendum)
Take Celexa 3 tablets in the morning (30mg  total) every day Lorazepam: take twice daily as needed for severe panic attack. This medication can make you sleep so be careful taking during the daytime.  I have refilled your tramadol as well.

## 2016-02-25 NOTE — Assessment & Plan Note (Addendum)
Patient with increased frequency of panic disorders, most likely due to acute stressors. - advised to increase Cymbalta to 30mg  daily - PRN Lorazepam #15. Discussed that this was a short term thing as it is not indicated for long term and has addictive properties.  - f/u with me in 1 month if symptoms not improving, 3 months if everything is stable.

## 2016-02-25 NOTE — Assessment & Plan Note (Signed)
Patient has not established with rheumatology. Is currently off sulfasalazine.  - rx for tramadol  - continue to monitor - pt has f/u in 3 months with rheumatology.

## 2016-04-02 ENCOUNTER — Other Ambulatory Visit: Payer: Self-pay | Admitting: Family Medicine

## 2016-04-06 ENCOUNTER — Other Ambulatory Visit: Payer: Self-pay | Admitting: Family Medicine

## 2016-04-14 ENCOUNTER — Telehealth: Payer: Self-pay | Admitting: Family Medicine

## 2016-04-14 NOTE — Telephone Encounter (Signed)
Pt would like a prescription called in for a cough. She is allergic to aspirin and doesn't want it to affect her other medications.  [employer] .

## 2016-04-15 MED ORDER — GUAIFENESIN ER 600 MG PO TB12: 600.0000 mg | ORAL_TABLET | Freq: Two times a day (BID) | ORAL | Status: DC

## 2016-04-15 NOTE — Telephone Encounter (Signed)
Please let the patient know that I called in guaifenesin for her- it helps with cough, does not contain aspirin, and will not interact with her other medications.  Thanks, [employer]

## 2016-04-16 MED ORDER — GUAIFENESIN ER 600 MG PO TB12: 600.0000 mg | ORAL_TABLET | Freq: Two times a day (BID) | ORAL | Status: DC

## 2016-04-16 NOTE — Telephone Encounter (Signed)
Initial rx not received by pharmacy, rx re-sent, patient informed.

## 2016-04-16 NOTE — Addendum Note (Signed)
Addended by: Garen Grams F on: 04/16/2016 09:49 AM   Modules accepted: Orders

## 2016-04-27 ENCOUNTER — Other Ambulatory Visit: Payer: Self-pay | Admitting: Family Medicine

## 2016-04-27 NOTE — Telephone Encounter (Signed)
Refilled prednisone for 1 month supply, please let the pt know that her rheumatologist should be filling this medication as well in the future.  Thanks, [employer]

## 2016-05-05 NOTE — Telephone Encounter (Signed)
Left message on vm for patient ?

## 2016-05-19 ENCOUNTER — Emergency Department (HOSPITAL_COMMUNITY): Payer: Medicaid Other

## 2016-05-19 ENCOUNTER — Emergency Department (HOSPITAL_COMMUNITY)
Admission: EM | Admit: 2016-05-19 | Discharge: 2016-05-19 | Disposition: A | Discharge status: Home / Self Care | Payer: Medicaid Other | Attending: Dermatology | Admitting: Dermatology

## 2016-05-19 DIAGNOSIS — Z5321 Procedure and treatment not carried out due to patient leaving prior to being seen by health care provider: Secondary | ICD-10-CM | POA: Insufficient documentation

## 2016-05-19 DIAGNOSIS — M25562 Pain in left knee: Secondary | ICD-10-CM | POA: Diagnosis present

## 2016-05-19 DIAGNOSIS — M069 Rheumatoid arthritis, unspecified: Secondary | ICD-10-CM | POA: Insufficient documentation

## 2016-05-19 DIAGNOSIS — J45909 Unspecified asthma, uncomplicated: Secondary | ICD-10-CM | POA: Insufficient documentation

## 2016-05-19 DIAGNOSIS — Z7951 Long term (current) use of inhaled steroids: Secondary | ICD-10-CM | POA: Diagnosis not present

## 2016-05-19 NOTE — ED Notes (Signed)
Pt. Left AMA with family , to notify MD

## 2016-05-19 NOTE — ED Triage Notes (Signed)
Pt states that she has a hx of rheumatoid arthritis and has had a knee replacement on her R knee but her L knee has started bothering this weekend. Alert and oriented.

## 2016-05-26 ENCOUNTER — Other Ambulatory Visit: Payer: Self-pay | Admitting: Family Medicine

## 2016-05-26 NOTE — Telephone Encounter (Signed)
Attempted to call patient however no answer.   If she returns call please let her know when I last refilled her prednisone on 7/3 I stated it was no longer appropriate for me to fill this medication and she should be getting it from her rheumatologist at this time.  Additionally, Ativan and tramadol are controlled substances and she needs to come in for evaluation to see if they are still warranted (as the Ativan was given a small quantity in May and she was supposed to f/u in 1 month).  Thanks, Joanna Puff, MD [facility] Family Medicine Resident  05/26/2016, 12:45 PM

## 2016-06-05 NOTE — Telephone Encounter (Signed)
Called patient, no answer, no vm

## 2016-06-19 ENCOUNTER — Other Ambulatory Visit: Payer: Self-pay | Admitting: *Deleted

## 2016-06-20 ENCOUNTER — Other Ambulatory Visit: Payer: Self-pay | Admitting: Family Medicine

## 2016-06-22 ENCOUNTER — Other Ambulatory Visit: Payer: Self-pay | Admitting: *Deleted

## 2016-06-22 ENCOUNTER — Other Ambulatory Visit: Payer: Self-pay | Admitting: Family Medicine

## 2016-06-22 NOTE — Telephone Encounter (Signed)
Refills for controlled substances like tramadol and Ativan are not appropriate over the phone. Please have the pt make an appt to be evaluated (as she has not had a prescription since May).  Thanks, Joanna Puff, MD [facility] Family Medicine Resident  06/22/2016, 12:24 PM

## 2016-06-22 NOTE — Telephone Encounter (Signed)
Tramadol is a controlled substance. She should follow up in clinic if she needs a refill on this medication.  I did refill her muscle relaxer and medication for anxiety/panic.   Thanks, Joanna Puff, MD [facility] Family Medicine Resident

## 2016-06-24 NOTE — Telephone Encounter (Signed)
Patient informed, appointment scheduled with PCP for 9/6. 

## 2016-06-24 NOTE — Telephone Encounter (Signed)
Patient informed, appointment scheduled with PCP for 9/6.

## 2016-07-01 ENCOUNTER — Ambulatory Visit (INDEPENDENT_AMBULATORY_CARE_PROVIDER_SITE_OTHER): Payer: Medicaid Other | Admitting: Family Medicine

## 2016-07-01 VITALS — BP 123/76 | HR 101 | Temp 99.2°F | Ht 64.0 in | Wt 204.6 lb

## 2016-07-01 DIAGNOSIS — F410 Panic disorder [episodic paroxysmal anxiety] without agoraphobia: Secondary | ICD-10-CM | POA: Diagnosis not present

## 2016-07-01 DIAGNOSIS — Z1211 Encounter for screening for malignant neoplasm of colon: Secondary | ICD-10-CM | POA: Diagnosis present

## 2016-07-01 DIAGNOSIS — M25562 Pain in left knee: Secondary | ICD-10-CM

## 2016-07-01 MED ORDER — CITALOPRAM HYDROBROMIDE 40 MG PO TABS: 40.0000 mg | ORAL_TABLET | Freq: Every day | ORAL | 1 refills | Status: DC

## 2016-07-01 MED ORDER — TRAMADOL HCL 50 MG PO TABS: 50.0000 mg | ORAL_TABLET | Freq: Four times a day (QID) | ORAL | 1 refills | Status: DC | PRN

## 2016-07-01 NOTE — Progress Notes (Signed)
Patient ID: Kathy [name], female   DOB: 1965-11-03, 50 y.o.   MRN: [id number]    Subjective: CC: f/u RA HPI: Patient is a 50 y.o. female with a past medical history of RA, anemia, DJD  presenting to clinic today to follow up on her pain.   Rheumatoid arthritis: She will Dr. Sharmon Revere this Friday. She stopped sulfasalazine. She is still on Plaquenil, methrotrexate, and prednisone. Having significant pain in her hands and left knee, left knee pain is the most prominent complaint. No hand swelling.  Needs a refill on tramadol.  Left knee pain: patient notes that she has been bothered by her L knee since R knee replacement (>37yr). She's note worsening swelling over the last week. No erythema. No fevers.  No popping, catching, locking, or giving out. Does note some crepitus with movement.  Dr. Magnus Ivan went out of town and she did not want to see someone else to see them.  Pt's weight up to 204 from 189. She feels she has still been able to walk.   Panic disorder: moderately controlled on cymbalta 30mg  daily.   Social History: never smoker  ROS: All other systems reviewed and are negative besides that noted in HPI  Past Medical History Patient Active Problem List   Diagnosis Date Noted  . Left knee pain 07/06/2016  . Panic disorder 07/23/2015  . Rheumatoid arthritis of right knee 06/18/2015  . Status post total right knee replacement 06/18/2015  . Tachycardia 04/04/2015  . 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 Tingling 03/15/2015  . Malnutrition of moderate degree (HCC) 09/20/2014  . Rheumatoid arthritis flare (HCC) 09/18/2014  . Hypokalemia 09/18/2014  . Loss of weight 09/18/2014  . Anorexia 09/18/2014  . Anemia 09/18/2014  . TROCHANTERIC BURSITIS, RIGHT 05/20/2010  . VITAMIN D DEFICIENCY 02/16/2010  . COUGH 02/04/2010  . Rheumatoid arthritis(714.0) 09/15/2009  . DEGENERATIVE JOINT DISEASE, GENERALIZED 07/28/2007    Medications- reviewed and updated Current  Outpatient Prescriptions  Medication Sig Dispense Refill  . albuterol (PROVENTIL HFA;VENTOLIN HFA) 108 (90 BASE) MCG/ACT inhaler Inhale 1-2 puffs into the lungs every 6 (six) hours as needed for wheezing or shortness of breath.    . citalopram (CELEXA) 40 MG tablet Take 1 tablet (40 mg total) by mouth daily. 30 tablet 1  . ferrous sulfate 325 (65 FE) MG tablet TAKE ONE (1) TABLET BY MOUTH EVERY DAY 90 tablet 0  . folic acid (FOLVITE) 1 MG tablet TAKE ONE (1) TABLET BY MOUTH EVERY DAY 30 tablet 0  . guaiFENesin (MUCINEX) 600 MG 12 hr tablet Take 1 tablet (600 mg total) by mouth 2 (two) times daily. 30 tablet 0  . hydroxychloroquine (PLAQUENIL) 200 MG tablet Take 200 mg by mouth daily.    09/27/2007 LORazepam (ATIVAN) 1 MG tablet Take 1 tablet (1 mg total) by mouth every 12 (twelve) hours as needed for anxiety. 15 tablet 0  . methotrexate (RHEUMATREX) 2.5 MG tablet Take 10 tablets (25 mg total) by mouth once a week. 40 tablet 0  . Multiple Vitamins-Minerals (MULTIVITAMIN PO) Take 1 tablet by mouth daily.    Marland Kitchen omeprazole (PRILOSEC) 20 MG capsule Take 1 capsule (20 mg total) by mouth daily. 90 capsule 1  . predniSONE (DELTASONE) 5 MG tablet TAKE ONE (1) TABLET BY MOUTH TWO (2) TIMES DAILY WITH A MEAL 60 tablet 0  . ranitidine (ZANTAC) 150 MG tablet Take 150 mg by mouth 2 (two) times daily.    . ranitidine (ZANTAC) 150 MG tablet TAKE ONE (  1) TABLET BY MOUTH TWO (2) TIMES DAILY AS NEEDED 60 tablet 1  . SENNA LAX 8.6 MG tablet TAKE ONE (1) TABLET BY MOUTH EVERY DAY AS NEEDED FOR MILD CONSTIPATION 100 tablet 0  . tiZANidine (ZANAFLEX) 4 MG tablet TAKE 1 TABLET BY MOUTH EVERY 6 HOURS AS NEEDED FOR MUSCLE SPASMS 60 tablet 1  . traMADol (ULTRAM) 50 MG tablet Take 1 tablet (50 mg total) by mouth every 6 (six) hours as needed (pain). 30 tablet 1   No current facility-administered medications for this visit.     Objective: Office vital signs reviewed. BP 123/76 (BP Location: Left Arm, Patient Position: Sitting,  Cuff Size: Normal)   Pulse (!) 101   Temp 99.2 F (37.3 C) (Oral)   Ht 5\' 4"  (1.626 m)   Wt 204 lb 9.6 oz (92.8 kg)   SpO2 95%   BMI 35.12 kg/m  Repeat 132/94  Physical Examination:  General: Awake, alert, well- nourished, pleasant in NAD.  Cardio: RRR, no m/r/g noted. No pitting edema Pulm: No increased WOB.  CTAB, without wheezes, rhonchi or crackles noted.  Left knee: Normal to inspection without erythema, ecchymoses, effusion or obvious bony abnormalities.  No obvious Baker's cysts Palpation normal with no warmth or joint line tenderness or patellar tenderness or condyle tenderness.  No TTP along infrapatellar or pes anserine bursas.   ROM normal in flexion (135 degrees) and extension (0 degrees) and lower leg rotation with significant crepitus.  Ligaments with solid consistent endpoints including ACL, PCL, LCL, MCL.  Negative Anterior Drawer/Lachman/Pivot Shift Negative Mcmurray's and Thessaly. Non painful patellar compression.  Normal Patellar glide.  No apprehension  Patellar and quadriceps tendons unremarkable. Hamstring and quadriceps strength is normal. Neuro: Strength 5/5 in left knee flexion and extension, 4/5 in right knee flexion and extension. 5/5 in hip flexion and extension bilaterally.   Assessment/Plan: Panic disorder Stable. Will go to Cymbalta 40mg  daily.   Left knee pain Most likely 2/2 RA. She has a h/o R total knee replacement previously. X-ray of the L knee a few months ago revealed osteopenia with advanced osteoarthritic changes, but no acute fractures. I discussed that I am unsure how beneficial a steroid injection would be for her at this time.  - pt to go back to Dr. office.  - refilled tramadol - patient to f/u PRN   Health maintenance: referral to GI for colonoscopy. Flu vaccine not available.   Orders Placed This Encounter  Procedures  . Ambulatory referral to Gastroenterology    Referral Priority:   Routine    Referral Type:    Consultation    Referral Reason:   Specialty Services Required    Number of Visits Requested:   1    Meds ordered this encounter  Medications  . citalopram (CELEXA) 40 MG tablet    Sig: Take 1 tablet (40 mg total) by mouth daily.    Dispense:  30 tablet    Refill:  1  . traMADol (ULTRAM) 50 MG tablet    Sig: Take 1 tablet (50 mg total) by mouth every 6 (six) hours as needed (pain).    Dispense:  30 tablet    Refill:  1    PGY-3, [facility] Family Medicine

## 2016-07-01 NOTE — Patient Instructions (Addendum)
Ask your rheumatologist to forward me your records, also ask her about getting the flu vaccine with the potential medications that she may start. Please also ask her about the TDAP vaccine as you're also due for this.   Make an appt with Dr. Magnus Ivan. I have refilled your tramadol. If you're having to take this more regularly, we may switch you to a different pain medication so that it doesn't interfere with the Celexa.  You are now due for a colonoscopy, I have referred you to GI to discuss this.   Follow up with me in 3 months or sooner as needed.

## 2016-07-06 DIAGNOSIS — M25562 Pain in left knee: Secondary | ICD-10-CM | POA: Insufficient documentation

## 2016-07-06 NOTE — Assessment & Plan Note (Signed)
Most likely 2/2 RA. She has a h/o R total knee replacement previously. X-ray of the L knee a few months ago revealed osteopenia with advanced osteoarthritic changes, but no acute fractures. I discussed that I am unsure how beneficial a steroid injection would be for her at this time.  - pt to go back to Dr. Eliberto Ivory office.  - refilled tramadol - patient to f/u PRN

## 2016-07-06 NOTE — Assessment & Plan Note (Signed)
Stable. Will go to Cymbalta 40mg  daily.

## 2016-07-10 ENCOUNTER — Emergency Department (HOSPITAL_COMMUNITY): Payer: Medicaid Other

## 2016-07-10 ENCOUNTER — Emergency Department (HOSPITAL_COMMUNITY)
Admission: EM | Admit: 2016-07-10 | Discharge: 2016-07-10 | Disposition: A | Discharge status: Home / Self Care | Payer: Medicaid Other | Attending: Emergency Medicine | Admitting: Emergency Medicine

## 2016-07-10 ENCOUNTER — Encounter (HOSPITAL_COMMUNITY): Payer: Self-pay | Admitting: *Deleted

## 2016-07-10 DIAGNOSIS — Z91010 Allergy to peanuts: Secondary | ICD-10-CM | POA: Insufficient documentation

## 2016-07-10 DIAGNOSIS — Y939 Activity, unspecified: Secondary | ICD-10-CM | POA: Insufficient documentation

## 2016-07-10 DIAGNOSIS — Y92410 Unspecified street and highway as the place of occurrence of the external cause: Secondary | ICD-10-CM | POA: Insufficient documentation

## 2016-07-10 DIAGNOSIS — S8992XA Unspecified injury of left lower leg, initial encounter: Secondary | ICD-10-CM | POA: Diagnosis present

## 2016-07-10 DIAGNOSIS — S86812A Strain of other muscle(s) and tendon(s) at lower leg level, left leg, initial encounter: Secondary | ICD-10-CM | POA: Diagnosis not present

## 2016-07-10 DIAGNOSIS — Z96651 Presence of right artificial knee joint: Secondary | ICD-10-CM | POA: Insufficient documentation

## 2016-07-10 DIAGNOSIS — J45909 Unspecified asthma, uncomplicated: Secondary | ICD-10-CM | POA: Insufficient documentation

## 2016-07-10 DIAGNOSIS — Y999 Unspecified external cause status: Secondary | ICD-10-CM | POA: Insufficient documentation

## 2016-07-10 DIAGNOSIS — S86912A Strain of unspecified muscle(s) and tendon(s) at lower leg level, left leg, initial encounter: Secondary | ICD-10-CM
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
## 2016-07-24 ENCOUNTER — Other Ambulatory Visit: Payer: Self-pay | Admitting: Family Medicine

## 2016-07-27 NOTE — Telephone Encounter (Signed)
Pt is calling for a refill on her Prednisone to be called in at 1 tablet in the AM and 1 tablet in the PM. jw

## 2016-07-31 ENCOUNTER — Other Ambulatory Visit: Payer: Self-pay | Admitting: Family Medicine

## 2016-08-12 ENCOUNTER — Ambulatory Visit (INDEPENDENT_AMBULATORY_CARE_PROVIDER_SITE_OTHER): Payer: Medicaid Other | Admitting: Family Medicine

## 2016-08-12 ENCOUNTER — Encounter: Payer: Self-pay | Admitting: Family Medicine

## 2016-08-12 DIAGNOSIS — G8929 Other chronic pain: Secondary | ICD-10-CM | POA: Diagnosis not present

## 2016-08-12 DIAGNOSIS — S39012A Strain of muscle, fascia and tendon of lower back, initial encounter: Secondary | ICD-10-CM

## 2016-08-12 DIAGNOSIS — M25562 Pain in left knee: Secondary | ICD-10-CM

## 2016-08-12 MED ORDER — ALBUTEROL SULFATE HFA 108 (90 BASE) MCG/ACT IN AERS: 1.0000 | INHALATION_SPRAY | Freq: Four times a day (QID) | RESPIRATORY_TRACT | 2 refills | Status: DC | PRN

## 2016-08-12 MED ORDER — DICLOFENAC SODIUM 1 % TD GEL: 2.0000 g | Freq: Four times a day (QID) | TRANSDERMAL | 0 refills | Status: DC

## 2016-08-12 MED ORDER — CYCLOBENZAPRINE HCL 10 MG PO TABS: 10.0000 mg | ORAL_TABLET | Freq: Three times a day (TID) | ORAL | 0 refills | Status: DC | PRN

## 2016-08-12 MED ORDER — PREDNISONE 5 MG PO TABS: ORAL_TABLET | ORAL | 1 refills | Status: DC
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
## 2016-08-13 DIAGNOSIS — S39012A Strain of muscle, fascia and tendon of lower back, initial encounter: Secondary | ICD-10-CM | POA: Insufficient documentation

## 2016-08-13 NOTE — Assessment & Plan Note (Signed)
Seems stable without acute flare. Following with rheumatology.  - refilled prednisone  - continue methotrexate and plaquenil per rheum (will need an annual eye exam if not done). - discussed staying active, I believe PT will be helpful.

## 2016-08-13 NOTE — Assessment & Plan Note (Signed)
Degenerative changes noted on most recent imaging. Pt will ultimately need a knee replacement in the future. Agree with PT for symptomatic treatment right now. - continue to f/u with Dr. Magnus Ivan - PT - continue voltaren gel PRN  - f/u with me PRN for this.
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
## 2016-08-17 ENCOUNTER — Ambulatory Visit (INDEPENDENT_AMBULATORY_CARE_PROVIDER_SITE_OTHER): Payer: Self-pay | Admitting: Physician Assistant

## 2016-08-19 ENCOUNTER — Other Ambulatory Visit: Payer: Self-pay | Admitting: Family Medicine

## 2016-08-20 ENCOUNTER — Telehealth: Payer: Self-pay | Admitting: *Deleted

## 2016-08-20 MED ORDER — CITALOPRAM HYDROBROMIDE 40 MG PO TABS: 40.0000 mg | ORAL_TABLET | Freq: Every day | ORAL | 1 refills | Status: DC

## 2016-08-20 NOTE — Telephone Encounter (Signed)
Kathy [name] from [place] called to verify dosage of Celexa.  Pt was previously on 10mg  TID but appears to be changed to 40mg  daily.  But last refill that was sent in was for 10mg  BID.  Is pt to take 70mg ?  Will forward to MD. Fleeger, , CMA

## 2016-08-20 NOTE — Telephone Encounter (Signed)
Patient received a Rx for tramadol with 1 refill on 9/6, not appropriate to get a refill on this today. 30 pills should last her 30 days as she should not be taking it daily.  I refilled her citalopram.  Thanks, Joanna Puff, MD [facility] Resident  08/20/2016, 1:59 PM

## 2016-08-20 NOTE — Telephone Encounter (Signed)
Called Darl Pikes back to clarify. The patient was previously on Celexa 30mg  daily, we increased her to 40mg  daily. Unfortunately, the confusion came from the pharmacy requesting a refill on the 30mg  daily (celexa 10mg  tablets, 3 daily).   D/c'd order for the 30mg  daily to prevent this confusion in the future. Re-ordered Celexa 40mg  daily. Dicussed change with pharmacy.  , MD [facility] Resident  08/20/2016, 4:20 PM

## 2016-09-03 ENCOUNTER — Other Ambulatory Visit: Payer: Self-pay | Admitting: Family Medicine

## 2016-09-29 ENCOUNTER — Other Ambulatory Visit: Payer: Self-pay | Admitting: *Deleted

## 2016-09-29 NOTE — Telephone Encounter (Signed)
Patient needs to be evaluated prior to refill Ultram. Either she can make an appt with [place] or she can request it from her rheumatologist at her appt later this week.  Joanna Puff, MD [facility] Resident  09/29/2016, 4:02 PM

## 2016-09-30 NOTE — Telephone Encounter (Signed)
Called patient and voicemail came on. Left message for patient to call office back. If patient does call please relay information and schedule appointment if she does not want to have Rx refilled via Rheumatologist. Maryjean Morn, CMA

## 2016-10-07 ENCOUNTER — Encounter: Payer: Self-pay | Admitting: Family Medicine

## 2016-10-07 ENCOUNTER — Ambulatory Visit (INDEPENDENT_AMBULATORY_CARE_PROVIDER_SITE_OTHER): Payer: Medicaid Other | Admitting: Family Medicine

## 2016-10-07 VITALS — BP 140/94 | HR 105 | Temp 98.8°F | Ht 64.0 in | Wt 214.8 lb

## 2016-10-07 DIAGNOSIS — G8929 Other chronic pain: Secondary | ICD-10-CM

## 2016-10-07 DIAGNOSIS — M25562 Pain in left knee: Secondary | ICD-10-CM

## 2016-10-07 DIAGNOSIS — Z1211 Encounter for screening for malignant neoplasm of colon: Secondary | ICD-10-CM | POA: Diagnosis not present

## 2016-10-07 MED ORDER — TIZANIDINE HCL 4 MG PO TABS: 4.0000 mg | ORAL_TABLET | Freq: Three times a day (TID) | ORAL | 2 refills | Status: DC | PRN

## 2016-10-07 MED ORDER — CITALOPRAM HYDROBROMIDE 40 MG PO TABS: 40.0000 mg | ORAL_TABLET | Freq: Every day | ORAL | 1 refills | Status: DC

## 2016-10-07 MED ORDER — TRAMADOL HCL 50 MG PO TABS: 50.0000 mg | ORAL_TABLET | Freq: Four times a day (QID) | ORAL | 1 refills | Status: DC | PRN

## 2016-10-07 MED ORDER — PREDNISONE 5 MG PO TABS: ORAL_TABLET | ORAL | 1 refills | Status: DC

## 2016-10-07 NOTE — Assessment & Plan Note (Signed)
Degenerative changes noted on most recent imaging. Discussed symptomatic treatment and PT for now. - continue to f/u with ortho - continue PT - continue voltaren gel and tramadol PRN.
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
## 2016-10-07 NOTE — Patient Instructions (Signed)
I have refilled all your prescriptions.  Please talk to Dr. Herma Carson about prescribing the prednisone if she thinks it is still necessary.  Make sure to follow up with PT.  Follow up with me in 2 months or sooner as needed.

## 2016-10-07 NOTE — Assessment & Plan Note (Signed)
Stable without flare. - refilled prednisone, stressed importance of having rheum fill this medication. - continue methotrexate and plaquenil per rheum (assume she'll have an annual eye exam).

## 2016-10-15 ENCOUNTER — Other Ambulatory Visit: Payer: Self-pay | Admitting: *Deleted

## 2016-10-15 NOTE — Telephone Encounter (Signed)
Patient received a Rx for tramadol on 12/13 with 1 refill. Declined electronic request for tramadol.  Joanna Puff, MD [facility] Family Medicine Resident  10/15/2016, 1:35 PM

## 2016-10-30 IMAGING — DX DG ANKLE COMPLETE 3+V*R*
3 series · 3 of 3 positions shown · non-contrast
Comparison: None.

CLINICAL DATA: Patient status post injury to the lower leg. Pain in
the anterior ankle and anterior knee.

EXAM:
RIGHT ANKLE - COMPLETE 3+ VIEW

[ankle ap]
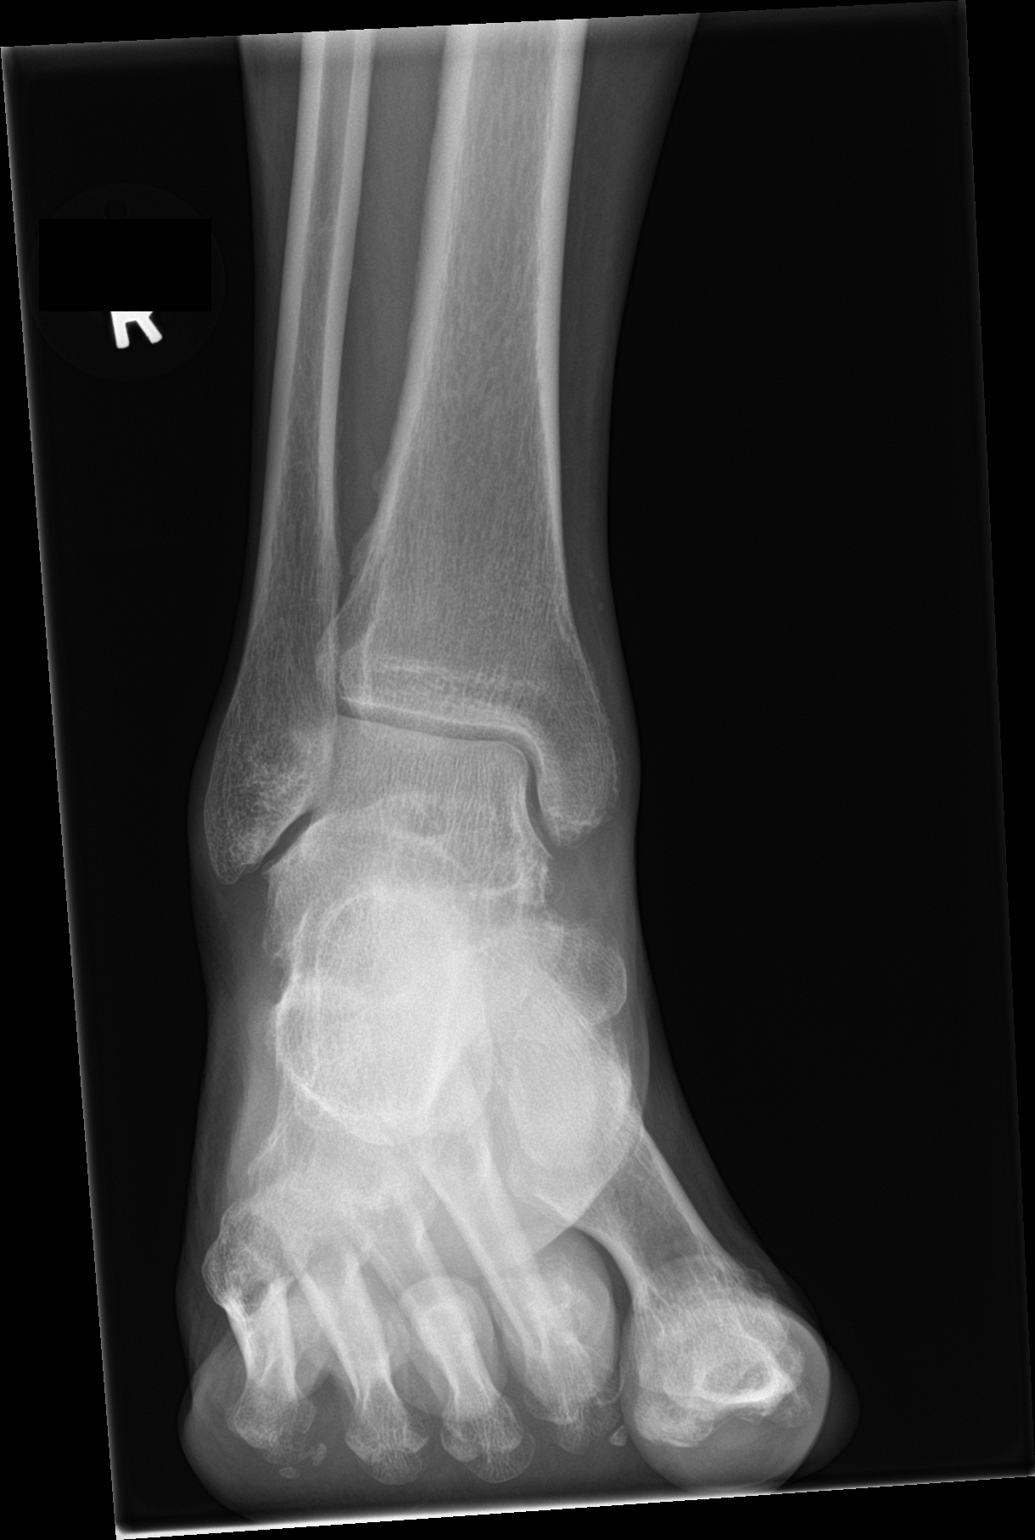

[ankle obl]
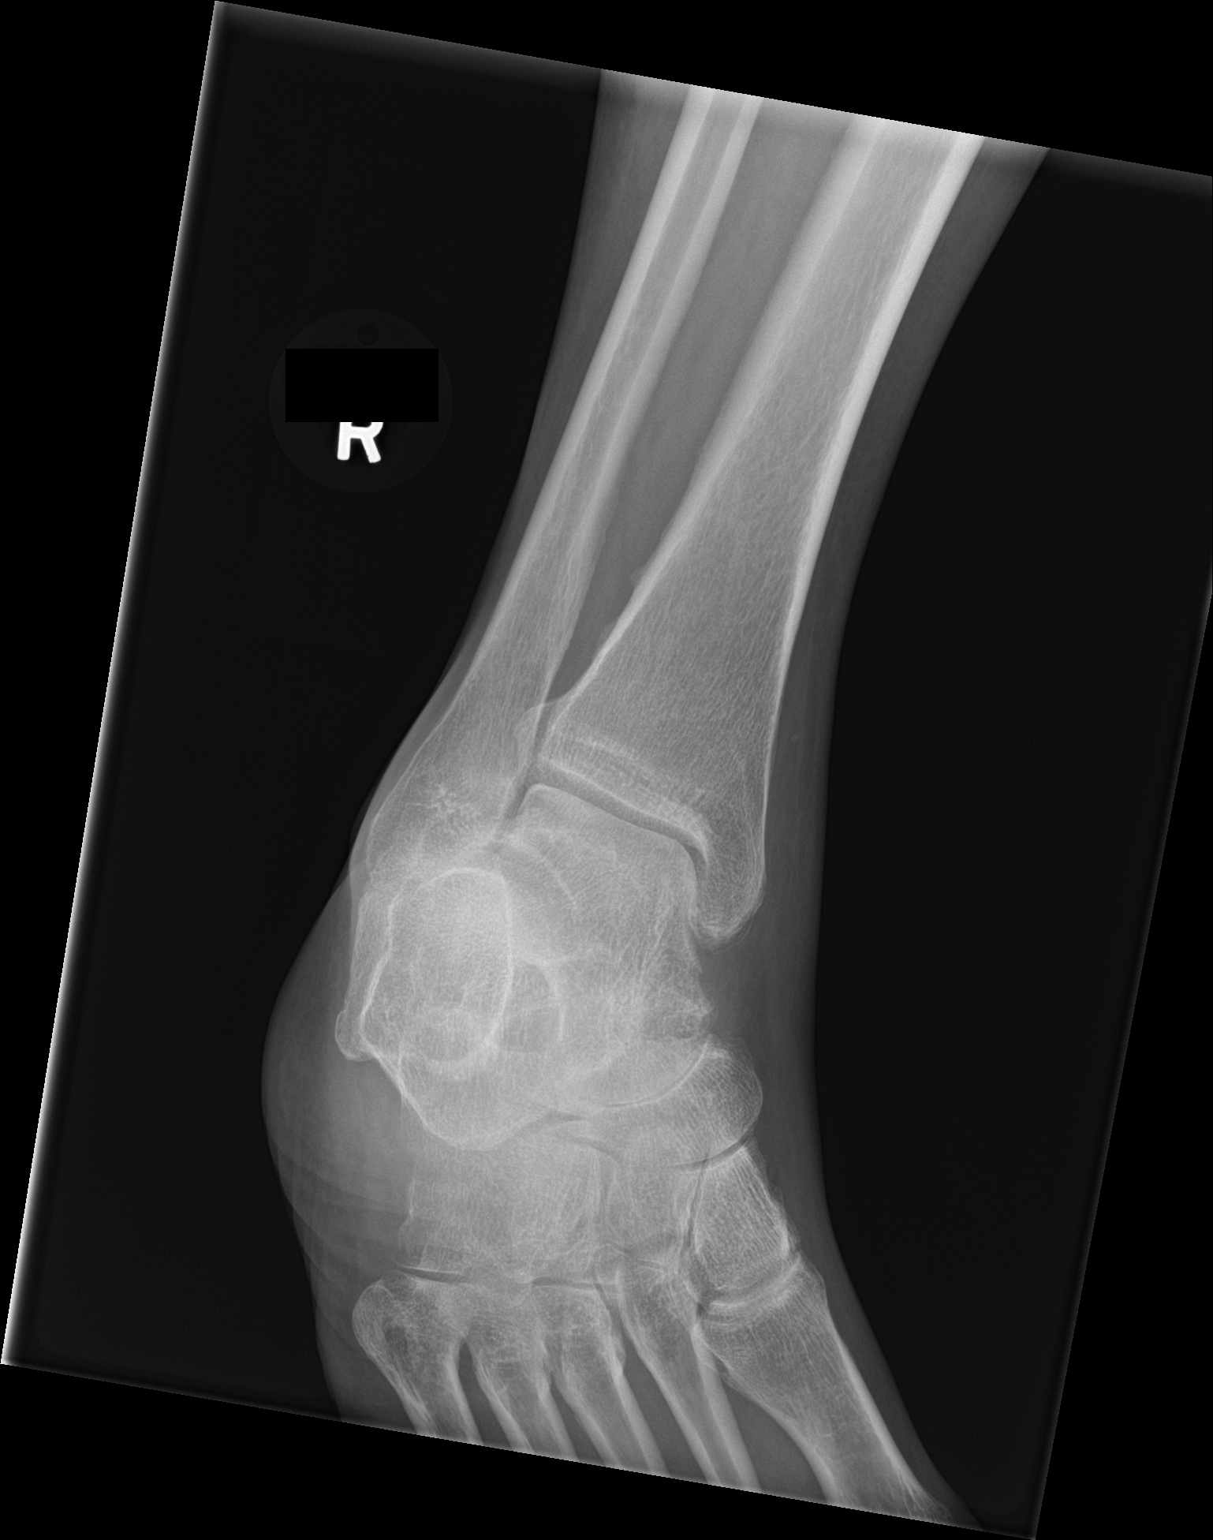

[ankle lat]
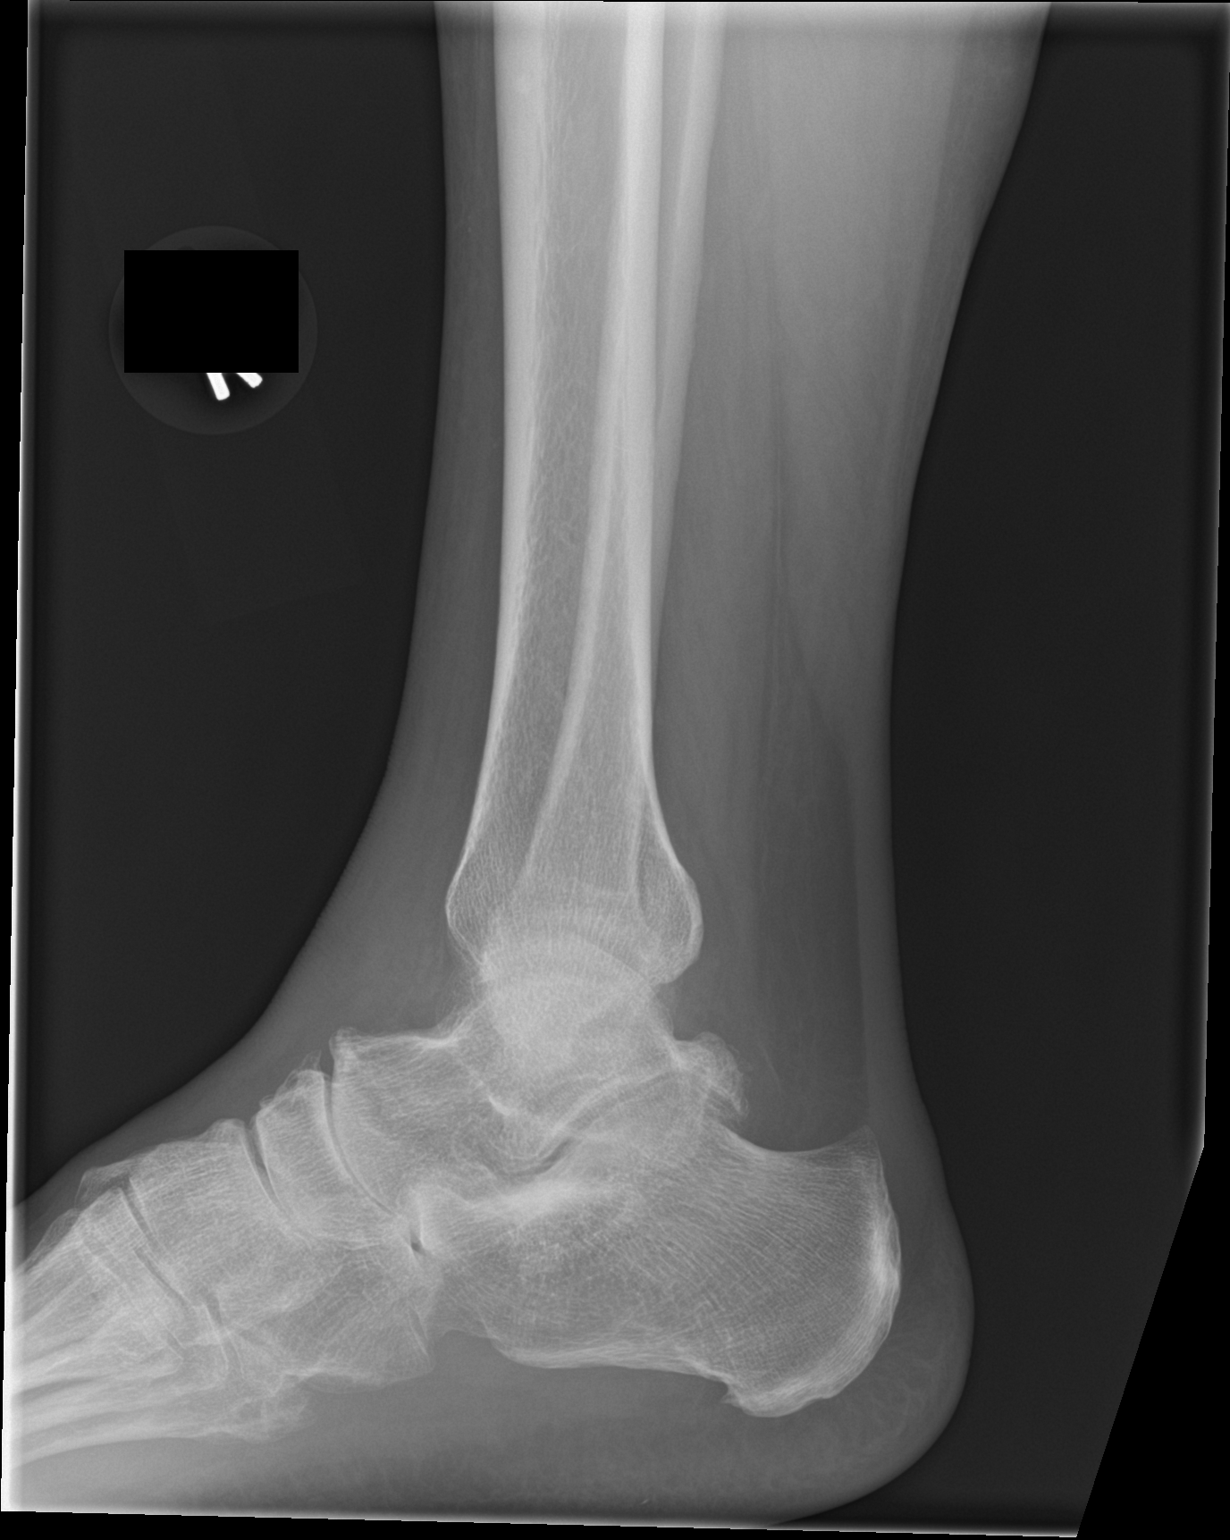

[3 of 3 positions shown; findings below may reference images not displayed]

FINDINGS: Normal anatomic alignment. No evidence for acute fracture or
dislocation. Talar dome is intact. No overlying soft tissue
swelling. Marked midfoot and hindfoot degenerative changes.
Posterior and plantar calcaneal spurring.
IMPRESSION: No acute osseous abnormality.

Marked degenerative changes.

## 2016-11-02 ENCOUNTER — Other Ambulatory Visit: Payer: Self-pay | Admitting: Family Medicine

## 2016-11-09 ENCOUNTER — Encounter: Payer: Self-pay | Admitting: Family Medicine

## 2016-11-26 ENCOUNTER — Other Ambulatory Visit: Payer: Self-pay | Admitting: Family Medicine

## 2016-12-22 ENCOUNTER — Other Ambulatory Visit: Payer: Self-pay | Admitting: Family Medicine

## 2016-12-22 NOTE — Telephone Encounter (Signed)
Please let the patient know her refill request for prednisone was not refilled by me. As we discussed at her last visit, her rheumatologist, Dr. Herma Carson, should be managing this medication.  Thank you, Joanna Puff, MD [facility] Family Medicine Resident  12/22/2016, 4:22 PM

## 2016-12-25 ENCOUNTER — Telehealth (INDEPENDENT_AMBULATORY_CARE_PROVIDER_SITE_OTHER): Payer: Self-pay | Admitting: Orthopaedic Surgery

## 2016-12-28 ENCOUNTER — Telehealth (INDEPENDENT_AMBULATORY_CARE_PROVIDER_SITE_OTHER): Payer: Self-pay | Admitting: Orthopaedic Surgery

## 2016-12-28 NOTE — Telephone Encounter (Signed)
Patient scheduled 01/11/17 @ 3:15pm with Dr Magnus Ivan

## 2016-12-31 NOTE — Telephone Encounter (Signed)
Call routed  

## 2017-01-03 IMAGING — DX DG KNEE 1-2V PORT*R*
2 series · 2 of 2 positions shown · non-contrast
Comparison: 04/14/2015

CLINICAL DATA: Status post total knee replacement.

EXAM:
PORTABLE RIGHT KNEE - 1-2 VIEW

[knee ap]
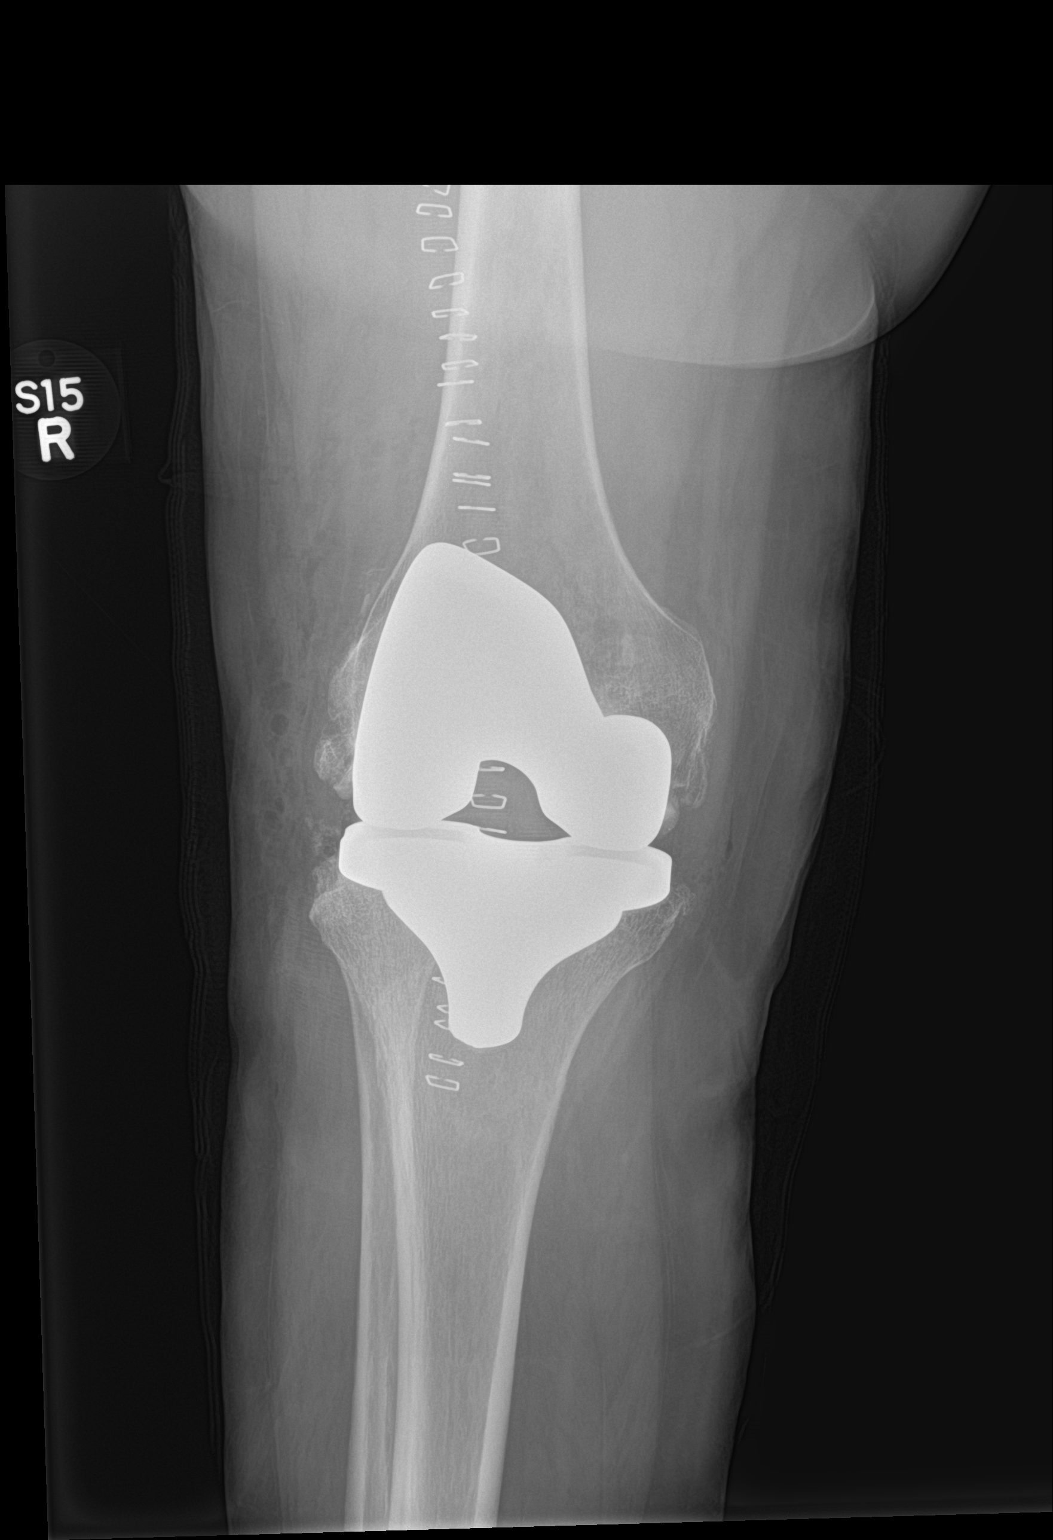

[knee lat]
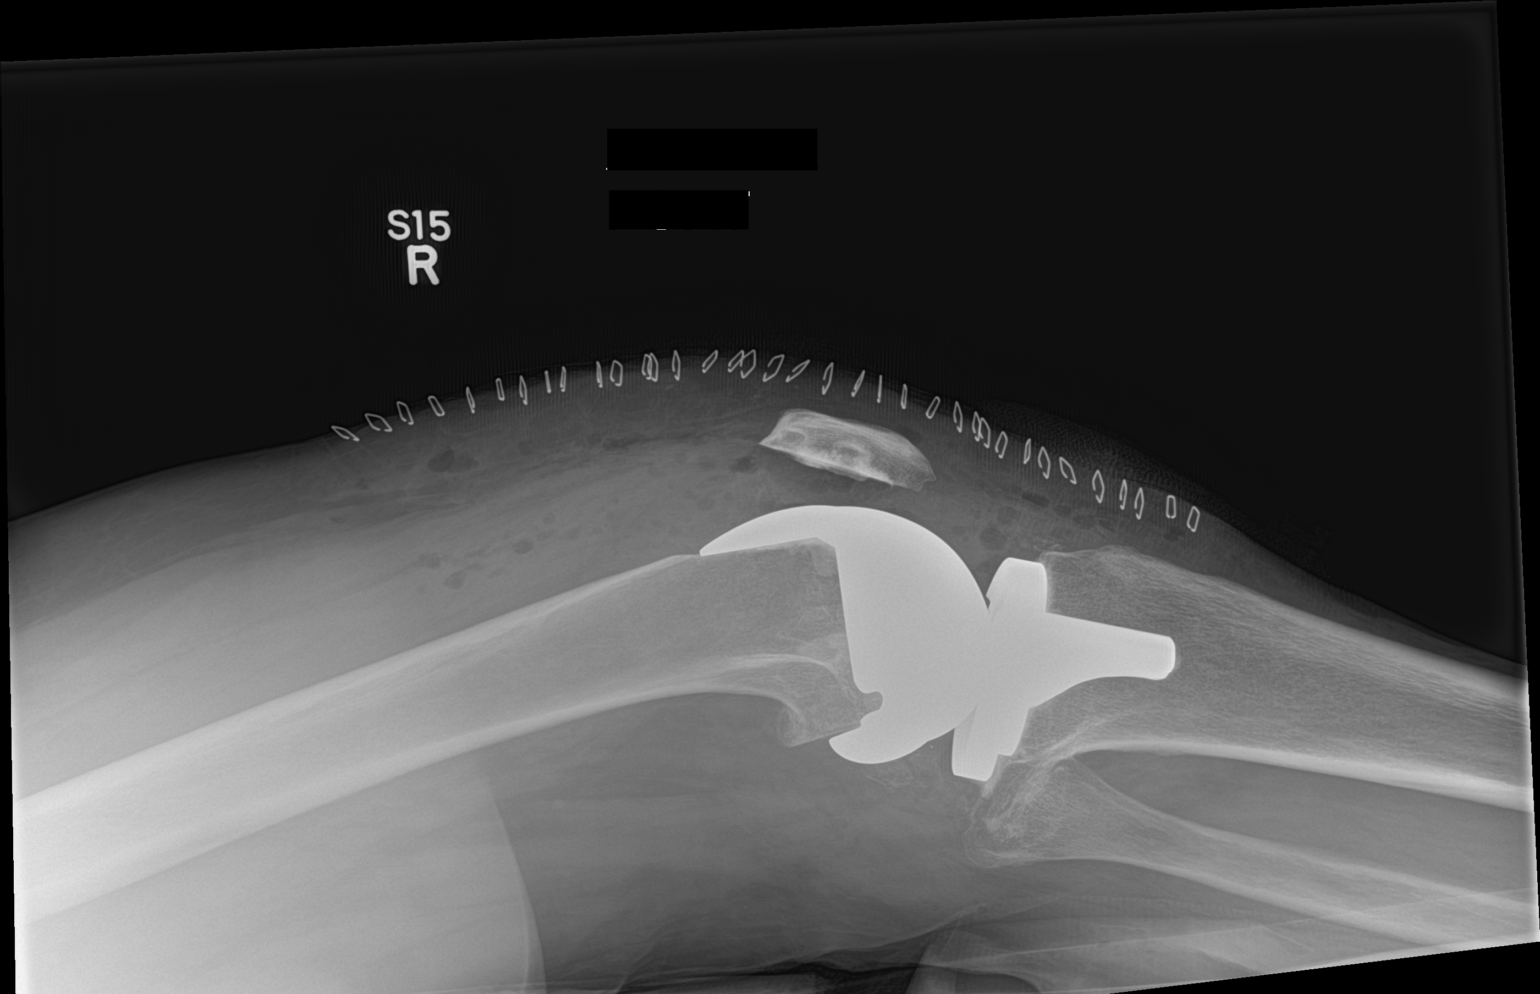

[2 of 2 positions shown; findings below may reference images not displayed]

FINDINGS: Total knee arthroplasty. Soft tissue changes compatible with recent
surgery. Anterior skin staples are present. The knee arthroplasty is
located. No evidence for a hardware complication. Small densities or
tiny bone fragments along the lateral aspect of the knee.
IMPRESSION: Right total knee arthroplasty with expected postsurgical changes.

## 2017-01-11 ENCOUNTER — Ambulatory Visit (INDEPENDENT_AMBULATORY_CARE_PROVIDER_SITE_OTHER): Payer: Medicaid Other | Admitting: Orthopaedic Surgery

## 2017-01-11 ENCOUNTER — Other Ambulatory Visit: Payer: Self-pay | Admitting: Family Medicine

## 2017-01-11 ENCOUNTER — Ambulatory Visit (INDEPENDENT_AMBULATORY_CARE_PROVIDER_SITE_OTHER): Payer: Medicaid Other

## 2017-01-11 DIAGNOSIS — M25562 Pain in left knee: Secondary | ICD-10-CM | POA: Diagnosis not present

## 2017-01-11 DIAGNOSIS — M1712 Unilateral primary osteoarthritis, left knee: Secondary | ICD-10-CM | POA: Diagnosis not present

## 2017-01-11 DIAGNOSIS — G8929 Other chronic pain: Secondary | ICD-10-CM | POA: Diagnosis not present

## 2017-01-11 NOTE — Progress Notes (Signed)
Office Visit Note   Patient: Kathy [name]           Date of Birth: Aug 08, 1966           MRN: [id number] Visit Date: 01/11/2017              Requested by: Joanna Puff, MD [address] [place], [place] [address] PCP: Rodrigo Ran, MD   Assessment & Plan: Visit Diagnoses:  1. Chronic pain of left knee   2. Unilateral primary osteoarthritis, left knee     Plan: Her posture arthritis of her left knee is profound and severe. There is no joint space left at all. This was similar to her right knee that we replaced in August 2016. She would like to consider knee replacement in the summer. I'll try some tramadol for now. She is already on prednisone. We'll see her back in 2 months to see when she also schedule surgery. No x-rays of be needed at visit. Obviously if things worsen before then she'll let us know. She'll continue her over-the-counter knee brace that she is using his well.  Follow-Up Instructions: Return in about 2 months (around 03/13/2017).   Orders:  Orders Placed This Encounter  Procedures  . XR Knee 1-2 Views Left   No orders of the defined types were placed in this encounter.     Procedures: No procedures performed   Clinical Data: No additional findings.   Subjective: No chief complaint on file. The patient is well-known to me. We performed a right total knee replacement in 2016 on her. She comes in with chief complaint of left knee pain. It is severe. It is 10 out of 10. It is affecting detrimentally her activity is daily living, her quality of life, and her mobility. She feels that this summer she would be ready for a left total knee replacement. She is artery tried and failed all forms conservative treatment. She is using a knee brace is got a significant way worse for her.  HPI  Review of Systems He currently denies any chest pain, headache, chest pain, shortness of breath, fever, chills, nausea, vomiting.  Objective: Vital Signs: There  were no vitals taken for this visit.  Physical Exam His alert and oriented 3 in no acute distress. She walks with significant limp favoring her left knee. She walks very slow as a result of this. Ortho Exam She has global tenderness around her left knee. She lacks full extension by at least 5 and I can flex her to barely past 90. She has pain over the pes bursa as well as the medial lateral joint lines. There severe patellofemoral crepitation. Specialty Comments:  No specialty comments available.  Imaging: Xr Knee 1-2 Views Left  Result Date: 01/11/2017 2 views of her left knee show profound end-stage arthritis. There is complete loss of the joint space in all 3 compartments. There sclerotic and cystic changes was para-articular osteophytes.    PMFS History: Patient Active Problem List   Diagnosis Date Noted  . Back strain 08/13/2016  . Left knee pain 07/06/2016  . Panic disorder 07/23/2015  . Rheumatoid arthritis of right knee 06/18/2015  . Status post total right knee replacement 06/18/2015  . Tachycardia 04/04/2015  . 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 Tingling 03/15/2015  . Malnutrition of moderate degree (HCC) 09/20/2014  . Rheumatoid arthritis flare (HCC) 09/18/2014  . Hypokalemia 09/18/2014  . Loss of weight 09/18/2014  . Anorexia 09/18/2014  . Anemia 09/18/2014  .  TROCHANTERIC BURSITIS, RIGHT 05/20/2010  . VITAMIN D DEFICIENCY 02/16/2010  . COUGH 02/04/2010  . Rheumatoid arthritis(714.0) 09/15/2009  . DEGENERATIVE JOINT DISEASE, GENERALIZED 07/28/2007   Past Medical History:  Diagnosis Date  . Asthma    seasonal  . GERD (gastroesophageal reflux disease)   . Rheumatoid arteritis     Family History  Problem Relation Age of Onset  . Asthma Mother   . Diabetes Mother   . Hypertension Mother   . Heart disease Mother   . Asthma Father   . Cancer Father     lung  . Cancer Sister     breast at 52  . Cancer Brother     unknown    Past  Surgical History:  Procedure Laterality Date  . ABDOMINAL HYSTERECTOMY    . TOTAL KNEE ARTHROPLASTY Right 06/18/2015   Procedure: RIGHT TOTAL KNEE ARTHROPLASTY;  Surgeon: Kathryne Hitch, MD;  Location: [facility] OR;  Service: Orthopedics;  Laterality: Right;   Social History   Occupational History  . Not on file.   Social History Main Topics  . Smoking status: Never Smoker  . Smokeless tobacco: Never Used  . Alcohol use No  . Drug use: No  . Sexual activity: Not on file

## 2017-02-10 ENCOUNTER — Other Ambulatory Visit: Payer: Self-pay | Admitting: Family Medicine

## 2017-03-11 ENCOUNTER — Encounter (INDEPENDENT_AMBULATORY_CARE_PROVIDER_SITE_OTHER): Payer: Self-pay

## 2017-03-11 ENCOUNTER — Encounter (INDEPENDENT_AMBULATORY_CARE_PROVIDER_SITE_OTHER): Payer: Self-pay | Admitting: Orthopaedic Surgery

## 2017-03-11 ENCOUNTER — Other Ambulatory Visit: Payer: Self-pay | Admitting: *Deleted

## 2017-03-11 ENCOUNTER — Ambulatory Visit (INDEPENDENT_AMBULATORY_CARE_PROVIDER_SITE_OTHER): Payer: Medicaid Other | Admitting: Orthopaedic Surgery

## 2017-03-11 VITALS — Ht 64.0 in | Wt 214.0 lb

## 2017-03-11 DIAGNOSIS — M25562 Pain in left knee: Secondary | ICD-10-CM

## 2017-03-11 DIAGNOSIS — M1712 Unilateral primary osteoarthritis, left knee: Secondary | ICD-10-CM | POA: Diagnosis not present

## 2017-03-11 DIAGNOSIS — G8929 Other chronic pain: Secondary | ICD-10-CM

## 2017-03-11 MED ORDER — CITALOPRAM HYDROBROMIDE 40 MG PO TABS: ORAL_TABLET | ORAL | 1 refills | Status: DC

## 2017-03-11 NOTE — Progress Notes (Signed)
The patient is well-known to me. She has severe rheumatologic disease. She is status post a right total knee arthroplasty done in 2016. I've been seeing her for her left knee and it is severely arthritic. She lacks full extension by about 5-10 and has flexion only 90. We try to steroid injection are needed helps some. She also consider a knee replacement sometime around July. She'll me to check her out today to make sure she had not had a blood clot following a long plane flight this point but was done sometime in about March.  On examination other than her knee that are examined her calf is soft there is no palpable cords there is no tenderness. Her foot is not swollen. There is no evidence on clinical exam of a blood clot.  At this point I gave her our surgery scheduler's card. If she decides to have her left knee replacement give [place] a call in a total release give [place] plenty heads-up to have this done in July that she should at least called sometime in early June. I would not need to see her for an extensive preoperative appointment since she is well-known to [place] if she fully understands the risks and benefits of surgery as well as what it involves having US performed a right knee on her before.

## 2017-03-12 ENCOUNTER — Other Ambulatory Visit: Payer: Self-pay | Admitting: Family Medicine

## 2017-03-23 ENCOUNTER — Other Ambulatory Visit: Payer: Self-pay | Admitting: Family Medicine

## 2017-04-12 ENCOUNTER — Other Ambulatory Visit: Payer: Self-pay | Admitting: Family Medicine

## 2017-05-03 ENCOUNTER — Other Ambulatory Visit (INDEPENDENT_AMBULATORY_CARE_PROVIDER_SITE_OTHER): Payer: Self-pay | Admitting: Orthopaedic Surgery

## 2017-05-04 ENCOUNTER — Other Ambulatory Visit (INDEPENDENT_AMBULATORY_CARE_PROVIDER_SITE_OTHER): Payer: Self-pay | Admitting: Physician Assistant

## 2017-05-05 NOTE — Pre-Procedure Instructions (Signed)
CRYSTA GULICK  05/05/2017      [facility] - [place], [place] - [address] [place] [address] Phone: [phone] Fax: [phone]    Your procedure is scheduled on July 17  Report to [facility] Admitting at [facility] A.M.  Call this number if you have problems the morning of surgery:  [phone]   Remember:  Do not eat food or drink liquids after midnight.   Take these medicines the morning of surgery with A SIP OF WATER acetaminophen (TYLENOL) if needed, albuterol (PROVENTIL HFA;VENTOLIN HFA) if needed, citalopram (CELEXA), omeprazole (PRILOSEC),  predniSONE (DELTASONE) if still taking on the regular basis, ranitidine (ZANTAC), tiZANidine (ZANAFLEX), traMADol (ULTRAM)  7 days prior to surgery STOP taking any Aspirin, Aleve, Naproxen, Ibuprofen, Motrin, Advil, Goody's, BC's, all herbal medications, fish oil, and all vitamins    Do not wear jewelry, make-up or nail polish.  Do not wear lotions, powders, or perfumes, or deoderant.  Do not shave 48 hours prior to surgery.    Do not bring valuables to the hospital.  [facility] is not responsible for any belongings or valuables.  Contacts, dentures or bridgework may not be worn into surgery.  Leave your suitcase in the car.  After surgery it may be brought to your room.  For patients admitted to the hospital, discharge time will be determined by your treatment team.  Patients discharged the day of surgery will not be allowed to drive home.    Special instructions:   [place]- Preparing For Surgery  Before surgery, you can play an important role. Because skin is not sterile, your skin needs to be as free of germs as possible. You can reduce the number of germs on your skin by washing with CHG (chlorahexidine gluconate) Soap before surgery.  CHG is an antiseptic cleaner which kills germs and bonds with the skin to continue killing germs even after  washing.  Please do not use if you have an allergy to CHG or antibacterial soaps. If your skin becomes reddened/irritated stop using the CHG.  Do not shave (including legs and underarms) for at least 48 hours prior to first CHG shower. It is OK to shave your face.  Please follow these instructions carefully.   1. Shower the NIGHT BEFORE SURGERY and the MORNING OF SURGERY with CHG.   2. If you chose to wash your hair, wash your hair first as usual with your normal shampoo.  3. After you shampoo, rinse your hair and body thoroughly to remove the shampoo.  4. Use CHG as you would any other liquid soap. You can apply CHG directly to the skin and wash gently with a scrungie or a clean washcloth.   5. Apply the CHG Soap to your body ONLY FROM THE NECK DOWN.  Do not use on open wounds or open sores. Avoid contact with your eyes, ears, mouth and genitals (private parts). Wash genitals (private parts) with your normal soap.  6. Wash thoroughly, paying special attention to the area where your surgery will be performed.  7. Thoroughly rinse your body with warm water from the neck down.  8. DO NOT shower/wash with your normal soap after using and rinsing off the CHG Soap.  9. Pat yourself dry with a CLEAN TOWEL.   10. Wear CLEAN PAJAMAS   11. Place CLEAN SHEETS on your bed the night of your first shower and DO NOT SLEEP WITH  PETS.    Day of Surgery: Do not apply any deodorants/lotions. Please wear clean clothes to the hospital/surgery center.      Please read over the following fact sheets that you were given.

## 2017-05-06 ENCOUNTER — Encounter (HOSPITAL_COMMUNITY): Payer: Self-pay

## 2017-05-06 ENCOUNTER — Encounter (HOSPITAL_COMMUNITY)
Admission: RE | Admit: 2017-05-06 | Discharge: 2017-05-06 | Disposition: A | Discharge status: Home / Self Care | Payer: Medicaid Other | Source: Ambulatory Visit | Attending: Orthopaedic Surgery | Admitting: Orthopaedic Surgery

## 2017-05-06 DIAGNOSIS — Z01812 Encounter for preprocedural laboratory examination: Secondary | ICD-10-CM | POA: Insufficient documentation

## 2017-05-06 LAB — BASIC METABOLIC PANEL
ANION GAP: 9 (ref 5–15)
BUN: 10 mg/dL (ref 6–20)
CO2: 25 mmol/L (ref 22–32)
Calcium: 9 mg/dL (ref 8.9–10.3)
Chloride: 103 mmol/L (ref 101–111)
Creatinine, Ser: 0.75 mg/dL (ref 0.44–1.00)
Glucose, Bld: 83 mg/dL (ref 65–99)
POTASSIUM: 4.1 mmol/L (ref 3.5–5.1)
SODIUM: 137 mmol/L (ref 135–145)

## 2017-05-06 LAB — CBC
HEMATOCRIT: 37.4 % (ref 36.0–46.0)
HEMOGLOBIN: 12.3 g/dL (ref 12.0–15.0)
MCH: 30.3 pg (ref 26.0–34.0)
MCHC: 32.9 g/dL (ref 30.0–36.0)
MCV: 92.1 fL (ref 78.0–100.0)
Platelets: 330 10*3/uL (ref 150–400)
RBC: 4.06 MIL/uL (ref 3.87–5.11)
RDW: 14.3 % (ref 11.5–15.5)
WBC: 7.5 10*3/uL (ref 4.0–10.5)

## 2017-05-06 LAB — SURGICAL PCR SCREEN
MRSA, PCR: NEGATIVE
STAPHYLOCOCCUS AUREUS: NEGATIVE

## 2017-05-06 NOTE — Progress Notes (Signed)
JASMEEN FRITSCH            05/05/2017                          [facility] - [place], [place] - [address] [place] [address] Phone: [phone] Fax: [phone]              Your procedure is scheduled on July 17            Report to [facility] Admitting at [facility] A.M.            Call this number if you have problems the morning of surgery:            [phone]             Remember:            Do not eat food or drink liquids after midnight.             Take these medicines the morning of surgery with A SIP OF WATER acetaminophen (TYLENOL) if needed, albuterol (PROVENTIL HFA;VENTOLIN HFA) if needed, citalopram (CELEXA), omeprazole (PRILOSEC),  predniSONE (DELTASONE) if still taking on the regular basis, ranitidine (ZANTAC), tiZANidine (ZANAFLEX), traMADol (ULTRAM)  7 days prior to surgery STOP taking any Aspirin, Aleve, Naproxen, Ibuprofen, Motrin, Advil, Goody's, BC's, all herbal medications, fish oil, and all vitamins              Do not wear jewelry, make-up or nail polish.            Do not wear lotions, powders, or perfumes, or deoderant.            Do not shave 48 hours prior to surgery.              Do not bring valuables to the hospital.            [facility] is not responsible for any belongings or valuables.  Contacts, dentures or bridgework may not be worn into surgery.  Leave your suitcase in the car.  After surgery it may be brought to your room.  For patients admitted to the hospital, discharge time will be determined by your treatment team.  Patients discharged the day of surgery will not be allowed to drive home.    Special instructions:   [place]- Preparing For Surgery  Before surgery, you can play an important role. Because skin is not sterile, your skin needs to be as free of germs as possible. You can reduce the number of germs on your skin by washing with CHG  (chlorahexidine gluconate) Soap before surgery.  CHG is an antiseptic cleaner which kills germs and bonds with the skin to continue killing germs even after washing.  Please do not use if you have an allergy to CHG or antibacterial soaps. If your skin becomes reddened/irritated stop using the CHG.  Do not shave (including legs and underarms) for at least 48 hours prior to first CHG shower. It is OK to shave your face.  Please follow these instructions carefully.  1. Shower the NIGHT BEFORE SURGERY and the MORNING OF SURGERY with CHG.   2. If you chose to wash your hair, wash your hair first as usual with your normal shampoo.  3. After you shampoo, rinse your hair and body thoroughly to remove the shampoo.  4. Use CHG as you would any other liquid soap. You can apply CHG directly to the skin and wash gently with a scrungie or a clean washcloth.   5. Apply the CHG Soap to your body ONLY FROM THE NECK DOWN.  Do not use on open wounds or open sores. Avoid contact with your eyes, ears, mouth and genitals (private parts). Wash genitals (private parts) with your normal soap.  6. Wash thoroughly, paying special attention to the area where your surgery will be performed.  7. Thoroughly rinse your body with warm water from the neck down.  8. DO NOT shower/wash with your normal soap after using and rinsing off the CHG Soap.  9. Pat yourself dry with a CLEAN TOWEL.   10. Wear CLEAN PAJAMAS   11. Place CLEAN SHEETS on your bed the night of your first shower and DO NOT SLEEP WITH PETS.    Day of Surgery: Do not apply any deodorants/lotions. Please wear clean clothes to the hospital/surgery center.     Please read over the following fact sheets that you were given.

## 2017-05-06 NOTE — Progress Notes (Addendum)
PCP: Dr. Rodrigo Ran  Cardiologist: pt denies  EKG: denies past year  Stress test: pt denies ever  ECHO: pt denies ever  Cardiac Cath: pt denies ever  Chest x-ray: denies past year-no respiratory complications

## 2017-05-10 MED ORDER — TRANEXAMIC ACID 1000 MG/10ML IV SOLN
1000.0000 mg | INTRAVENOUS | Status: AC
  Administered 2017-05-11: 1000 mg via INTRAVENOUS
  Filled 2017-05-10: qty 1100

## 2017-05-10 MED ORDER — CEFAZOLIN SODIUM-DEXTROSE 2-4 GM/100ML-% IV SOLN
2.0000 g | INTRAVENOUS | Status: AC
  Administered 2017-05-11: 2 g via INTRAVENOUS
  Filled 2017-05-10: qty 100

## 2017-05-11 ENCOUNTER — Encounter (HOSPITAL_COMMUNITY)
Admission: RE | Disposition: A | Discharge status: Home / Self Care | Payer: Self-pay | Source: Ambulatory Visit | Attending: Orthopaedic Surgery

## 2017-05-11 ENCOUNTER — Inpatient Hospital Stay (HOSPITAL_COMMUNITY): Payer: Medicaid Other

## 2017-05-11 ENCOUNTER — Inpatient Hospital Stay (HOSPITAL_COMMUNITY): Payer: Medicaid Other | Admitting: Anesthesiology

## 2017-05-11 ENCOUNTER — Inpatient Hospital Stay (HOSPITAL_COMMUNITY)
Admission: RE | Admit: 2017-05-11 | Discharge: 2017-05-13 | DRG: 470 | Disposition: A | Discharge status: Home / Self Care | Payer: Medicaid Other | Source: Ambulatory Visit | Attending: Orthopaedic Surgery | Admitting: Orthopaedic Surgery

## 2017-05-11 ENCOUNTER — Encounter (HOSPITAL_COMMUNITY): Payer: Self-pay | Admitting: Urology

## 2017-05-11 DIAGNOSIS — Z79899 Other long term (current) drug therapy: Secondary | ICD-10-CM

## 2017-05-11 DIAGNOSIS — M069 Rheumatoid arthritis, unspecified: Secondary | ICD-10-CM | POA: Diagnosis present

## 2017-05-11 DIAGNOSIS — J45909 Unspecified asthma, uncomplicated: Secondary | ICD-10-CM | POA: Diagnosis present

## 2017-05-11 DIAGNOSIS — Z96651 Presence of right artificial knee joint: Secondary | ICD-10-CM | POA: Diagnosis present

## 2017-05-11 DIAGNOSIS — K219 Gastro-esophageal reflux disease without esophagitis: Secondary | ICD-10-CM | POA: Diagnosis present

## 2017-05-11 DIAGNOSIS — M1712 Unilateral primary osteoarthritis, left knee: Secondary | ICD-10-CM | POA: Diagnosis present

## 2017-05-11 DIAGNOSIS — D62 Acute posthemorrhagic anemia: Secondary | ICD-10-CM | POA: Diagnosis not present

## 2017-05-11 DIAGNOSIS — M25562 Pain in left knee: Secondary | ICD-10-CM | POA: Diagnosis present

## 2017-05-11 DIAGNOSIS — Z96652 Presence of left artificial knee joint: Secondary | ICD-10-CM

## 2017-05-11 HISTORY — PX: TOTAL KNEE ARTHROPLASTY: SHX125

## 2017-05-11 SURGERY — ARTHROPLASTY, KNEE, TOTAL
Anesthesia: General | Site: Knee | Laterality: Left

## 2017-05-11 MED ORDER — SUGAMMADEX SODIUM 200 MG/2ML IV SOLN
INTRAVENOUS | Status: DC | PRN
  Administered 2017-05-11: 193.8 mg via INTRAVENOUS

## 2017-05-11 MED ORDER — ONDANSETRON HCL 4 MG/2ML IJ SOLN
INTRAMUSCULAR | Status: DC | PRN
  Administered 2017-05-11: 4 mg via INTRAVENOUS

## 2017-05-11 MED ORDER — METOCLOPRAMIDE HCL 5 MG/ML IJ SOLN: 5.0000 mg | Freq: Three times a day (TID) | INTRAMUSCULAR | Status: DC | PRN

## 2017-05-11 MED ORDER — MEPERIDINE HCL 25 MG/ML IJ SOLN: 6.2500 mg | INTRAMUSCULAR | Status: DC | PRN

## 2017-05-11 MED ORDER — FUROSEMIDE 20 MG PO TABS
20.0000 mg | ORAL_TABLET | Freq: Every day | ORAL | Status: DC
  Administered 2017-05-12 – 2017-05-13 (×2): 20 mg via ORAL
  Filled 2017-05-11 (×2): qty 1

## 2017-05-11 MED ORDER — SODIUM CHLORIDE 0.9 % IV SOLN: INTRAVENOUS | Status: DC

## 2017-05-11 MED ORDER — LIDOCAINE HCL (CARDIAC) 20 MG/ML IV SOLN
INTRAVENOUS | Status: AC
  Filled 2017-05-11: qty 5

## 2017-05-11 MED ORDER — CITALOPRAM HYDROBROMIDE 40 MG PO TABS
40.0000 mg | ORAL_TABLET | Freq: Every day | ORAL | Status: DC
  Administered 2017-05-12 – 2017-05-13 (×3): 40 mg via ORAL
  Filled 2017-05-11 (×3): qty 1

## 2017-05-11 MED ORDER — HYDROXYCHLOROQUINE SULFATE 200 MG PO TABS
200.0000 mg | ORAL_TABLET | Freq: Every day | ORAL | Status: DC
  Administered 2017-05-12 – 2017-05-13 (×3): 200 mg via ORAL
  Filled 2017-05-11 (×3): qty 1

## 2017-05-11 MED ORDER — ONDANSETRON HCL 4 MG/2ML IJ SOLN
4.0000 mg | Freq: Four times a day (QID) | INTRAMUSCULAR | Status: DC | PRN
  Administered 2017-05-12: 4 mg via INTRAVENOUS
  Filled 2017-05-11: qty 2

## 2017-05-11 MED ORDER — PROMETHAZINE HCL 25 MG/ML IJ SOLN: 6.2500 mg | INTRAMUSCULAR | Status: DC | PRN

## 2017-05-11 MED ORDER — ACETAMINOPHEN 325 MG PO TABS
650.0000 mg | ORAL_TABLET | Freq: Four times a day (QID) | ORAL | Status: DC | PRN
  Administered 2017-05-11 – 2017-05-13 (×4): 650 mg via ORAL
  Filled 2017-05-11 (×4): qty 2

## 2017-05-11 MED ORDER — FENTANYL CITRATE (PF) 250 MCG/5ML IJ SOLN
INTRAMUSCULAR | Status: AC
  Filled 2017-05-11: qty 5

## 2017-05-11 MED ORDER — HYDROMORPHONE HCL 1 MG/ML IJ SOLN
INTRAMUSCULAR | Status: AC
  Administered 2017-05-11: 0.5 mg via INTRAVENOUS
  Filled 2017-05-11: qty 1

## 2017-05-11 MED ORDER — 0.9 % SODIUM CHLORIDE (POUR BTL) OPTIME
TOPICAL | Status: DC | PRN
  Administered 2017-05-11: 1000 mL

## 2017-05-11 MED ORDER — MIDAZOLAM HCL 2 MG/2ML IJ SOLN
INTRAMUSCULAR | Status: AC
  Filled 2017-05-11: qty 2

## 2017-05-11 MED ORDER — FENTANYL CITRATE (PF) 100 MCG/2ML IJ SOLN
50.0000 ug | Freq: Once | INTRAMUSCULAR | Status: AC
  Administered 2017-05-11: 50 ug via INTRAVENOUS

## 2017-05-11 MED ORDER — FENTANYL CITRATE (PF) 100 MCG/2ML IJ SOLN
INTRAMUSCULAR | Status: DC | PRN
Start: 2017-05-11 — End: 2017-05-11
  Administered 2017-05-11: 150 ug via INTRAVENOUS
  Administered 2017-05-11 (×2): 50 ug via INTRAVENOUS
  Administered 2017-05-11 (×2): 100 ug via INTRAVENOUS
  Administered 2017-05-11: 50 ug via INTRAVENOUS

## 2017-05-11 MED ORDER — MENTHOL 3 MG MT LOZG: 1.0000 | LOZENGE | OROMUCOSAL | Status: DC | PRN

## 2017-05-11 MED ORDER — POLYETHYLENE GLYCOL 3350 17 G PO PACK: 17.0000 g | PACK | Freq: Every day | ORAL | Status: DC | PRN

## 2017-05-11 MED ORDER — OXYCODONE HCL 5 MG PO TABS
5.0000 mg | ORAL_TABLET | ORAL | Status: DC | PRN
  Administered 2017-05-11 – 2017-05-13 (×8): 10 mg via ORAL
  Filled 2017-05-11 (×7): qty 2

## 2017-05-11 MED ORDER — FAMOTIDINE 20 MG PO TABS
20.0000 mg | ORAL_TABLET | Freq: Two times a day (BID) | ORAL | Status: DC
  Administered 2017-05-11 – 2017-05-13 (×4): 20 mg via ORAL
  Filled 2017-05-11 (×4): qty 1

## 2017-05-11 MED ORDER — MIDAZOLAM HCL 2 MG/2ML IJ SOLN
INTRAMUSCULAR | Status: AC
  Administered 2017-05-11: 2 mg via INTRAVENOUS
  Filled 2017-05-11: qty 2

## 2017-05-11 MED ORDER — ONDANSETRON HCL 4 MG PO TABS: 4.0000 mg | ORAL_TABLET | Freq: Four times a day (QID) | ORAL | Status: DC | PRN

## 2017-05-11 MED ORDER — FOLIC ACID 1 MG PO TABS
1.0000 mg | ORAL_TABLET | Freq: Every day | ORAL | Status: DC
  Administered 2017-05-12 – 2017-05-13 (×3): 1 mg via ORAL
  Filled 2017-05-11 (×3): qty 1

## 2017-05-11 MED ORDER — DOCUSATE SODIUM 100 MG PO CAPS
100.0000 mg | ORAL_CAPSULE | Freq: Two times a day (BID) | ORAL | Status: DC
  Administered 2017-05-11 – 2017-05-13 (×4): 100 mg via ORAL
  Filled 2017-05-11 (×4): qty 1

## 2017-05-11 MED ORDER — ROCURONIUM BROMIDE 100 MG/10ML IV SOLN
INTRAVENOUS | Status: DC | PRN
  Administered 2017-05-11: 10 mg via INTRAVENOUS
  Administered 2017-05-11: 40 mg via INTRAVENOUS

## 2017-05-11 MED ORDER — MIDAZOLAM HCL 2 MG/2ML IJ SOLN
2.0000 mg | Freq: Once | INTRAMUSCULAR | Status: AC
  Administered 2017-05-11: 2 mg via INTRAVENOUS

## 2017-05-11 MED ORDER — PREDNISONE 5 MG PO TABS
5.0000 mg | ORAL_TABLET | Freq: Two times a day (BID) | ORAL | Status: DC
  Administered 2017-05-12 – 2017-05-13 (×3): 5 mg via ORAL
  Filled 2017-05-11 (×3): qty 1

## 2017-05-11 MED ORDER — OXYCODONE HCL 5 MG/5ML PO SOLN: 5.0000 mg | Freq: Once | ORAL | Status: DC | PRN

## 2017-05-11 MED ORDER — HYDROMORPHONE HCL 1 MG/ML IJ SOLN
0.2500 mg | INTRAMUSCULAR | Status: DC | PRN
  Administered 2017-05-11 (×3): 0.5 mg via INTRAVENOUS

## 2017-05-11 MED ORDER — SODIUM CHLORIDE 0.9 % IR SOLN
Status: DC | PRN
  Administered 2017-05-11: 3000 mL

## 2017-05-11 MED ORDER — LIDOCAINE HCL (CARDIAC) 20 MG/ML IV SOLN
INTRAVENOUS | Status: DC | PRN
  Administered 2017-05-11: 50 mg via INTRAVENOUS

## 2017-05-11 MED ORDER — FENTANYL CITRATE (PF) 100 MCG/2ML IJ SOLN
INTRAMUSCULAR | Status: AC
  Administered 2017-05-11: 50 ug via INTRAVENOUS
  Filled 2017-05-11: qty 2

## 2017-05-11 MED ORDER — METHOCARBAMOL 500 MG PO TABS
500.0000 mg | ORAL_TABLET | Freq: Four times a day (QID) | ORAL | Status: DC | PRN
Start: 2017-05-11 — End: 2017-05-13
  Administered 2017-05-11 – 2017-05-13 (×5): 500 mg via ORAL
  Filled 2017-05-11 (×4): qty 1

## 2017-05-11 MED ORDER — ALUM & MAG HYDROXIDE-SIMETH 200-200-20 MG/5ML PO SUSP: 30.0000 mL | ORAL | Status: DC | PRN

## 2017-05-11 MED ORDER — RIVAROXABAN 10 MG PO TABS
10.0000 mg | ORAL_TABLET | Freq: Every day | ORAL | Status: DC
  Administered 2017-05-12 – 2017-05-13 (×2): 10 mg via ORAL
  Filled 2017-05-11 (×2): qty 1

## 2017-05-11 MED ORDER — CHLORHEXIDINE GLUCONATE 4 % EX LIQD: 60.0000 mL | Freq: Once | CUTANEOUS | Status: DC

## 2017-05-11 MED ORDER — ROCURONIUM BROMIDE 50 MG/5ML IV SOLN
INTRAVENOUS | Status: AC
  Filled 2017-05-11: qty 1

## 2017-05-11 MED ORDER — OXYCODONE HCL 5 MG PO TABS: 5.0000 mg | ORAL_TABLET | Freq: Once | ORAL | Status: DC | PRN

## 2017-05-11 MED ORDER — LACTATED RINGERS IV SOLN
INTRAVENOUS | Status: DC | PRN
Start: 2017-05-11 — End: 2017-05-11
  Administered 2017-05-11 (×2): via INTRAVENOUS

## 2017-05-11 MED ORDER — PROPOFOL 10 MG/ML IV BOLUS
INTRAVENOUS | Status: AC
  Filled 2017-05-11: qty 20

## 2017-05-11 MED ORDER — ONDANSETRON HCL 4 MG/2ML IJ SOLN
INTRAMUSCULAR | Status: AC
Start: 2017-05-11 — End: 2017-05-11
  Filled 2017-05-11: qty 2

## 2017-05-11 MED ORDER — PROPOFOL 10 MG/ML IV BOLUS
INTRAVENOUS | Status: DC | PRN
  Administered 2017-05-11: 200 mg via INTRAVENOUS

## 2017-05-11 MED ORDER — SUGAMMADEX SODIUM 200 MG/2ML IV SOLN
INTRAVENOUS | Status: AC
  Filled 2017-05-11: qty 2

## 2017-05-11 MED ORDER — ACETAMINOPHEN 650 MG RE SUPP: 650.0000 mg | Freq: Four times a day (QID) | RECTAL | Status: DC | PRN

## 2017-05-11 MED ORDER — ALBUTEROL SULFATE (2.5 MG/3ML) 0.083% IN NEBU: 3.0000 mL | INHALATION_SOLUTION | Freq: Four times a day (QID) | RESPIRATORY_TRACT | Status: DC | PRN

## 2017-05-11 MED ORDER — METOCLOPRAMIDE HCL 5 MG PO TABS: 5.0000 mg | ORAL_TABLET | Freq: Three times a day (TID) | ORAL | Status: DC | PRN

## 2017-05-11 MED ORDER — METHOCARBAMOL 1000 MG/10ML IJ SOLN
500.0000 mg | Freq: Four times a day (QID) | INTRAVENOUS | Status: DC | PRN
  Filled 2017-05-11: qty 5

## 2017-05-11 MED ORDER — HYDROMORPHONE HCL 1 MG/ML IJ SOLN
INTRAMUSCULAR | Status: AC
  Administered 2017-05-11: 0.5 mg via INTRAVENOUS
  Filled 2017-05-11: qty 0.5

## 2017-05-11 MED ORDER — MIDAZOLAM HCL 5 MG/5ML IJ SOLN
INTRAMUSCULAR | Status: DC | PRN
  Administered 2017-05-11: 2 mg via INTRAVENOUS

## 2017-05-11 MED ORDER — DIPHENHYDRAMINE HCL 12.5 MG/5ML PO ELIX: 12.5000 mg | ORAL_SOLUTION | ORAL | Status: DC | PRN

## 2017-05-11 MED ORDER — LACTATED RINGERS IV SOLN
INTRAVENOUS | Status: DC
  Administered 2017-05-11: 12:00:00 via INTRAVENOUS

## 2017-05-11 MED ORDER — METHOCARBAMOL 500 MG PO TABS
ORAL_TABLET | ORAL | Status: AC
  Filled 2017-05-11: qty 1

## 2017-05-11 MED ORDER — CEFAZOLIN SODIUM-DEXTROSE 1-4 GM/50ML-% IV SOLN
1.0000 g | Freq: Four times a day (QID) | INTRAVENOUS | Status: AC
  Administered 2017-05-11 – 2017-05-12 (×2): 1 g via INTRAVENOUS
  Filled 2017-05-11 (×2): qty 50

## 2017-05-11 MED ORDER — HYDROMORPHONE HCL 1 MG/ML IJ SOLN
1.0000 mg | INTRAMUSCULAR | Status: DC | PRN
  Administered 2017-05-12 – 2017-05-13 (×5): 1 mg via INTRAVENOUS
  Filled 2017-05-11 (×5): qty 1

## 2017-05-11 MED ORDER — PHENOL 1.4 % MT LIQD: 1.0000 | OROMUCOSAL | Status: DC | PRN

## 2017-05-11 MED ORDER — ROPIVACAINE HCL 5 MG/ML IJ SOLN
INTRAMUSCULAR | Status: DC | PRN
  Administered 2017-05-11: 20 mL via PERINEURAL

## 2017-05-11 MED ORDER — OXYCODONE HCL 5 MG PO TABS
ORAL_TABLET | ORAL | Status: AC
  Filled 2017-05-11: qty 2

## 2017-05-11 MED ORDER — FERROUS SULFATE 325 (65 FE) MG PO TABS
325.0000 mg | ORAL_TABLET | Freq: Every day | ORAL | Status: DC
  Administered 2017-05-12 – 2017-05-13 (×2): 325 mg via ORAL
  Filled 2017-05-11 (×2): qty 1

## 2017-05-11 SURGICAL SUPPLY — 62 items
BANDAGE ACE 6X5 VEL STRL LF (GAUZE/BANDAGES/DRESSINGS) ×2 IMPLANT
BANDAGE ELASTIC 6 VELCRO ST LF (GAUZE/BANDAGES/DRESSINGS) ×2 IMPLANT
BANDAGE ESMARK 6X9 LF (GAUZE/BANDAGES/DRESSINGS) ×1 IMPLANT
BLADE SAG 18X100X1.27 (BLADE) ×2 IMPLANT
BLADE SURG 10 STRL SS (BLADE) ×4 IMPLANT
BNDG ESMARK 6X9 LF (GAUZE/BANDAGES/DRESSINGS) ×2
BOWL SMART MIX CTS (DISPOSABLE) ×2 IMPLANT
CAPT KNEE TOTAL 3 ×2 IMPLANT
CEMENT BONE SIMPLEX SPEEDSET (Cement) ×4 IMPLANT
COVER SURGICAL LIGHT HANDLE (MISCELLANEOUS) ×2 IMPLANT
CUFF TOURNIQUET SINGLE 34IN LL (TOURNIQUET CUFF) ×2 IMPLANT
CUFF TOURNIQUET SINGLE 44IN (TOURNIQUET CUFF) IMPLANT
DRAPE EXTREMITY T 121X128X90 (DRAPE) ×2 IMPLANT
DRAPE HALF SHEET 40X57 (DRAPES) ×2 IMPLANT
DRAPE U-SHAPE 47X51 STRL (DRAPES) ×2 IMPLANT
DRSG PAD ABDOMINAL 8X10 ST (GAUZE/BANDAGES/DRESSINGS) ×4 IMPLANT
DURAPREP 26ML APPLICATOR (WOUND CARE) ×2 IMPLANT
ELECT CAUTERY BLADE 6.4 (BLADE) ×2 IMPLANT
ELECT REM PT RETURN 9FT ADLT (ELECTROSURGICAL) ×2
ELECTRODE REM PT RTRN 9FT ADLT (ELECTROSURGICAL) ×1 IMPLANT
FACESHIELD WRAPAROUND (MASK) ×6 IMPLANT
GAUZE SPONGE 4X4 12PLY STRL (GAUZE/BANDAGES/DRESSINGS) ×2 IMPLANT
GAUZE SPONGE 4X4 12PLY STRL LF (GAUZE/BANDAGES/DRESSINGS) ×2 IMPLANT
GAUZE XEROFORM 1X8 LF (GAUZE/BANDAGES/DRESSINGS) ×2 IMPLANT
GLOVE BIOGEL PI IND STRL 8 (GLOVE) ×2 IMPLANT
GLOVE BIOGEL PI INDICATOR 8 (GLOVE) ×2
GLOVE ORTHO TXT STRL SZ7.5 (GLOVE) ×2 IMPLANT
GLOVE SURG ORTHO 8.0 STRL STRW (GLOVE) ×2 IMPLANT
GOWN STRL REUS W/ TWL LRG LVL3 (GOWN DISPOSABLE) IMPLANT
GOWN STRL REUS W/ TWL XL LVL3 (GOWN DISPOSABLE) ×2 IMPLANT
GOWN STRL REUS W/TWL LRG LVL3 (GOWN DISPOSABLE)
GOWN STRL REUS W/TWL XL LVL3 (GOWN DISPOSABLE) ×2
HANDPIECE INTERPULSE COAX TIP (DISPOSABLE) ×1
IMMOBILIZER KNEE 22 UNIV (SOFTGOODS) ×2 IMPLANT
KIT BASIN OR (CUSTOM PROCEDURE TRAY) ×2 IMPLANT
KIT ROOM TURNOVER OR (KITS) ×2 IMPLANT
MANIFOLD NEPTUNE II (INSTRUMENTS) ×2 IMPLANT
NDL SAFETY ECLIPSE 18X1.5 (NEEDLE) IMPLANT
NEEDLE HYPO 18GX1.5 SHARP (NEEDLE)
NS IRRIG 1000ML POUR BTL (IV SOLUTION) ×2 IMPLANT
PACK TOTAL JOINT (CUSTOM PROCEDURE TRAY) ×2 IMPLANT
PAD ABD 8X10 STRL (GAUZE/BANDAGES/DRESSINGS) ×2 IMPLANT
PAD ARMBOARD 7.5X6 YLW CONV (MISCELLANEOUS) ×2 IMPLANT
PADDING CAST COTTON 6X4 STRL (CAST SUPPLIES) ×2 IMPLANT
SET HNDPC FAN SPRY TIP SCT (DISPOSABLE) ×1 IMPLANT
SET PAD KNEE POSITIONER (MISCELLANEOUS) ×2 IMPLANT
STAPLER VISISTAT 35W (STAPLE) ×2 IMPLANT
STRIP CLOSURE SKIN 1/2X4 (GAUZE/BANDAGES/DRESSINGS) IMPLANT
SUCTION FRAZIER HANDLE 10FR (MISCELLANEOUS) ×1
SUCTION TUBE FRAZIER 10FR DISP (MISCELLANEOUS) ×1 IMPLANT
SUT MNCRL AB 4-0 PS2 18 (SUTURE) IMPLANT
SUT VIC AB 0 CT1 27 (SUTURE) ×1
SUT VIC AB 0 CT1 27XBRD ANBCTR (SUTURE) ×1 IMPLANT
SUT VIC AB 1 CT1 27 (SUTURE) ×2
SUT VIC AB 1 CT1 27XBRD ANBCTR (SUTURE) ×2 IMPLANT
SUT VIC AB 2-0 CT1 27 (SUTURE) ×2
SUT VIC AB 2-0 CT1 TAPERPNT 27 (SUTURE) ×2 IMPLANT
SYR 50ML LL SCALE MARK (SYRINGE) IMPLANT
TOWEL OR 17X24 6PK STRL BLUE (TOWEL DISPOSABLE) ×2 IMPLANT
TOWEL OR 17X26 10 PK STRL BLUE (TOWEL DISPOSABLE) ×2 IMPLANT
TRAY CATH 16FR W/PLASTIC CATH (SET/KITS/TRAYS/PACK) IMPLANT
WRAP KNEE MAXI GEL POST OP (GAUZE/BANDAGES/DRESSINGS) ×2 IMPLANT

## 2017-05-11 NOTE — Anesthesia Procedure Notes (Signed)
Procedure Name: Intubation Date/Time: 05/11/2017 2:31 PM Performed by: Eligha Bridegroom Pre-anesthesia Checklist: Patient identified, Emergency Drugs available, Suction available, Patient being monitored and Timeout performed Patient Re-evaluated:Patient Re-evaluated prior to induction Oxygen Delivery Method: Circle system utilized Preoxygenation: Pre-oxygenation with 100% oxygen Induction Type: IV induction Ventilation: Mask ventilation without difficulty and Oral airway inserted - appropriate to patient size Laryngoscope Size: Mac and 3 Grade View: Grade I Tube type: Oral Tube size: 7.0 mm Number of attempts: 1 Airway Equipment and Method: Stylet Placement Confirmation: ETT inserted through vocal cords under direct vision,  positive ETCO2 and breath sounds checked- equal and bilateral Secured at: 21 cm Tube secured with: Tape Dental Injury: Teeth and Oropharynx as per pre-operative assessment

## 2017-05-11 NOTE — Anesthesia Procedure Notes (Addendum)
Anesthesia Regional Block: Adductor canal block   Pre-Anesthetic Checklist: ,, timeout performed, Correct Patient, Correct Site, Correct Laterality, Correct Procedure, Correct Position, site marked, Risks and benefits discussed,  Surgical consent,  Pre-op evaluation,  At surgeon's request and post-op pain management  Laterality: Left  Prep: Dura Prep       Needles:  Injection technique: Single-shot      Additional Needles:   Procedures: ultrasound guided,,,,,,,,  Narrative:  Start time: 05/11/2017 12:28 PM End time: 05/11/2017 12:32 PM Injection made incrementally with aspirations every 5 mL.  Performed by: Personally  Anesthesiologist: Anitra Lauth RAY

## 2017-05-11 NOTE — Progress Notes (Signed)
Orthopedic Tech Progress Note Patient Details:  Kathy [name] [birth year] [id number]  CPM Left Knee CPM Left Knee: On Left Knee Flexion (Degrees): 90 Left Knee Extension (Degrees): 0 Additional Comments: applied cpm as ordered 0-90 degrees on pt left knee/leg.  pt tolerated application very well.  left knee leg.   Alvina Chou 05/11/2017, 5:30 PM

## 2017-05-11 NOTE — H&P (Signed)
TOTAL KNEE ADMISSION H&P  Patient is being admitted for left total knee arthroplasty.  Subjective:  Chief Complaint:left knee pain.  HPI: Kathy [name], 51 y.o. female, has a history of pain and functional disability in the left knee due to arthritis and has failed non-surgical conservative treatments for greater than 12 weeks to includeNSAID's and/or analgesics, corticosteriod injections, viscosupplementation injections, flexibility and strengthening excercises, use of assistive devices, weight reduction as appropriate and activity modification.  Onset of symptoms was gradual, starting 2 years ago with gradually worsening course since that time. The patient noted no past surgery on the left knee(s).  Patient currently rates pain in the left knee(s) at 10 out of 10 with activity. Patient has night pain, worsening of pain with activity and weight bearing, pain that interferes with activities of daily living, pain with passive range of motion, crepitus and joint swelling.  Patient has evidence of subchondral sclerosis, periarticular osteophytes and joint space narrowing by imaging studies. There is no active infection.  Patient Active Problem List   Diagnosis Date Noted  . Unilateral primary osteoarthritis, left knee 03/11/2017  . Back strain 08/13/2016  . Left knee pain 07/06/2016  . Panic disorder 07/23/2015  . Rheumatoid arthritis of right knee 06/18/2015  . Status post total right knee replacement 06/18/2015  . Tachycardia 04/04/2015  . 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 Tingling 03/15/2015  . Malnutrition of moderate degree (HCC) 09/20/2014  . Rheumatoid arthritis flare (HCC) 09/18/2014  . Hypokalemia 09/18/2014  . Loss of weight 09/18/2014  . Anorexia 09/18/2014  . Anemia 09/18/2014  . TROCHANTERIC BURSITIS, RIGHT 05/20/2010  . VITAMIN D DEFICIENCY 02/16/2010  . COUGH 02/04/2010  . Rheumatoid arthritis(714.0) 09/15/2009  . DEGENERATIVE JOINT DISEASE,  GENERALIZED 07/28/2007   Past Medical History:  Diagnosis Date  . Asthma    seasonal  . GERD (gastroesophageal reflux disease)   . Rheumatoid arteritis     Past Surgical History:  Procedure Laterality Date  . ABDOMINAL HYSTERECTOMY    . TOTAL KNEE ARTHROPLASTY Right 06/18/2015   Procedure: RIGHT TOTAL KNEE ARTHROPLASTY;  Surgeon: Kathryne Hitch, MD;  Location: [facility] OR;  Service: Orthopedics;  Laterality: Right;    Prescriptions Prior to Admission  Medication Sig Dispense Refill Last Dose  . acetaminophen (TYLENOL) 650 MG CR tablet Take 650 mg by mouth 2 (two) times daily.   05/11/2017 at 0800  . albuterol (PROVENTIL HFA;VENTOLIN HFA) 108 (90 Base) MCG/ACT inhaler Inhale 1-2 puffs into the lungs every 6 (six) hours as needed for wheezing or shortness of breath. 6.7 g 2 05/11/2017 at 0800  . citalopram (CELEXA) 40 MG tablet TAKE ONE (1) TABLET BY MOUTH EVERY DAY 30 tablet 1 05/10/2017 at Unknown time  . diclofenac sodium (VOLTAREN) 1 % GEL Apply 2 g topically 4 (four) times daily. (Patient taking differently: Apply 2 g topically 4 (four) times daily as needed (pain). ) 100 g 0 05/10/2017 at Unknown time  . ferrous sulfate 325 (65 FE) MG tablet TAKE ONE (1) TABLET BY MOUTH EVERY DAY 90 tablet 1 05/10/2017 at Unknown time  . folic acid (FOLVITE) 1 MG tablet TAKE ONE (1) TABLET BY MOUTH EVERY DAY 30 tablet 0 05/10/2017 at Unknown time  . furosemide (LASIX) 20 MG tablet Take 20 mg by mouth daily.   05/10/2017 at Unknown time  . hydroxychloroquine (PLAQUENIL) 200 MG tablet Take 200 mg by mouth daily.   05/10/2017 at Unknown time  . omeprazole (PRILOSEC) 20 MG capsule Take 1 capsule (20  mg total) by mouth daily. 90 capsule 1 05/11/2017 at 0800  . predniSONE (DELTASONE) 5 MG tablet TAKE ONE (1) TABLET BY MOUTH TWO (2) TIMES DAILY WITH A MEAL 60 tablet 1 05/11/2017 at 0800  . ranitidine (ZANTAC) 150 MG tablet Take 150 mg by mouth 2 (two) times daily.   05/11/2017 at 0800  . ranitidine (ZANTAC) 150 MG  tablet TAKE ONE (1) TABLET BY MOUTH TWO (2) TIMES DAILY 60 tablet 0 05/11/2017 at 0800  . SENNA LAX 8.6 MG tablet TAKE ONE TABLET BY MOUTH EVERY DAY AS NEEDED FOR MILD CONSTIPATION 100 tablet 1 05/10/2017 at Unknown time  . tiZANidine (ZANAFLEX) 4 MG tablet TAKE ONE TABLET BY MOUTH EVERY EIGHT HOURS AS NEEDED FOR MUSCLE SPASMS 60 tablet 0 05/11/2017 at 0800  . traMADol (ULTRAM) 50 MG tablet Take 1 tablet (50 mg total) by mouth every 6 (six) hours as needed (pain). (Patient taking differently: Take 50 mg by mouth every 8 (eight) hours as needed (pain). ) 30 tablet 1 05/11/2017 at 0800   Allergies  Allergen Reactions  . Methotrexate Derivatives Shortness Of Breath and Nausea And Vomiting  . Peanut-Containing Drug Products Anaphylaxis  . Aspirin Nausea And Vomiting  . Eggs Or Egg-Derived Products Nausea And Vomiting  . Tomato Rash    Social History  Substance Use Topics  . Smoking status: Never Smoker  . Smokeless tobacco: Never Used  . Alcohol use No    Family History  Problem Relation Age of Onset  . Asthma Mother   . Diabetes Mother   . Hypertension Mother   . Heart disease Mother   . Asthma Father   . Cancer Father        lung  . Cancer Sister        breast at 69  . Cancer Brother        unknown     Review of Systems  Musculoskeletal: Positive for back pain and joint pain.  All other systems reviewed and are negative.   Objective:  Physical Exam  Constitutional: She is oriented to person, place, and time. She appears well-developed and well-nourished.  HENT:  Head: Normocephalic and atraumatic.  Eyes: Pupils are equal, round, and reactive to light. EOM are normal.  Neck: Normal range of motion. Neck supple.  Cardiovascular: Normal rate and regular rhythm.   Respiratory: Effort normal and breath sounds normal.  GI: Soft. Bowel sounds are normal.  Musculoskeletal:       Left knee: She exhibits decreased range of motion, effusion and abnormal alignment. Tenderness found.  Medial joint line and lateral joint line tenderness noted.  Neurological: She is alert and oriented to person, place, and time.  Skin: Skin is warm and dry.  Psychiatric: She has a normal mood and affect.    Vital signs in last 24 hours: Temp:  [98.3 F (36.8 C)] 98.3 F (36.8 C) (07/17 1138) Pulse Rate:  [87] 87 (07/17 1138) Resp:  [18] 18 (07/17 1138) BP: (150)/(87) 150/87 (07/17 1138) SpO2:  [97 %] 97 % (07/17 1138) Weight:  [213 lb 11.2 oz (96.9 kg)] 213 lb 11.2 oz (96.9 kg) (07/17 1138)  Labs:   Estimated body mass index is 35.56 kg/m as calculated from the following:   Height as of this encounter: 5\' 5"  (1.651 m).   Weight as of this encounter: 213 lb 11.2 oz (96.9 kg).   Imaging Review Plain radiographs demonstrate severe degenerative joint disease of the left knee(s). The overall alignment ismild varus.  The bone quality appears to be good for age and reported activity level.  Assessment/Plan:  End stage arthritis, left knee   The patient history, physical examination, clinical judgment of the provider and imaging studies are consistent with end stage degenerative joint disease of the left knee(s) and total knee arthroplasty is deemed medically necessary. The treatment options including medical management, injection therapy arthroscopy and arthroplasty were discussed at length. The risks and benefits of total knee arthroplasty were presented and reviewed. The risks due to aseptic loosening, infection, stiffness, patella tracking problems, thromboembolic complications and other imponderables were discussed. The patient acknowledged the explanation, agreed to proceed with the plan and consent was signed. Patient is being admitted for inpatient treatment for surgery, pain control, PT, OT, prophylactic antibiotics, VTE prophylaxis, progressive ambulation and ADL's and discharge planning. The patient is planning to be discharged home with home health services

## 2017-05-11 NOTE — Brief Op Note (Signed)
05/11/2017  3:56 PM  PATIENT:  Kathy [name]  51 y.o. female  PRE-OPERATIVE DIAGNOSIS:  severe arthritis left knee  POST-OPERATIVE DIAGNOSIS:  severe arthritis left knee  PROCEDURE:  Procedure(s): LEFT TOTAL KNEE ARTHROPLASTY (Left)  SURGEON:  Surgeon(s) and Role:    Kathryne Hitch, MD - Primary  PHYSICIAN ASSISTANT: Rexene Edison, PA-C  ANESTHESIA:   regional and general  EBL:  Total I/O In: -  Out: 100 [Blood:100]  COUNTS:  YES  TOURNIQUET:   Total Tourniquet Time Documented: Thigh (Left) - 52 minutes Total: Thigh (Left) - 52 minutes   DICTATION: .Other Dictation: Dictation Number [id number]  PLAN OF CARE: Admit to inpatient   PATIENT DISPOSITION:  PACU - hemodynamically stable.   Delay start of Pharmacological VTE agent (>24hrs) due to surgical blood loss or risk of bleeding: no

## 2017-05-11 NOTE — Progress Notes (Signed)
Orthopedic Tech Progress Note Patient Details:  HOORIA GASPARINI 23-Feb-1966 [id number]      Alvina Chou 05/11/2017, 5:30 PM

## 2017-05-11 NOTE — Anesthesia Preprocedure Evaluation (Addendum)
Anesthesia Evaluation  Patient identified by MRN, date of birth, ID band Patient awake  General Assessment Comment:On prednisone  Reviewed: Allergy & Precautions, NPO status , Patient's Chart, lab work & pertinent test results  History of Anesthesia Complications Negative for: history of anesthetic complications  Airway Mallampati: II       Dental  (+) Teeth Intact   Pulmonary neg pulmonary ROS, asthma ,    breath sounds clear to auscultation       Cardiovascular negative cardio ROS   Rhythm:Regular Rate:Tachycardia     Neuro/Psych negative neurological ROS  negative psych ROS   GI/Hepatic negative GI ROS, Neg liver ROS, GERD  ,  Endo/Other  negative endocrine ROS  Renal/GU negative Renal ROS  negative genitourinary   Musculoskeletal negative musculoskeletal ROS (+) Arthritis , Rheumatoid disorders,    Abdominal   Peds negative pediatric ROS (+)  Hematology negative hematology ROS (+) anemia ,   Anesthesia Other Findings   Reproductive/Obstetrics negative OB ROS                             Anesthesia Physical  Anesthesia Plan  ASA: III  Anesthesia Plan: General   Post-op Pain Management:    Induction: Intravenous  PONV Risk Score and Plan: 2 and Ondansetron and Propofol  Airway Management Planned: Oral ETT  Additional Equipment:   Intra-op Plan:   Post-operative Plan: Extubation in OR  Informed Consent: I have reviewed the patients History and Physical, chart, labs and discussed the procedure including the risks, benefits and alternatives for the proposed anesthesia with the patient or authorized representative who has indicated his/her understanding and acceptance.   Dental advisory given  Plan Discussed with: CRNA and Surgeon  Anesthesia Plan Comments:        Anesthesia Quick Evaluation

## 2017-05-11 NOTE — Transfer of Care (Signed)
Immediate Anesthesia Transfer of Care Note  Patient: Kathy [name]  Procedure(s) Performed: Procedure(s): LEFT TOTAL KNEE ARTHROPLASTY (Left)  Patient Location: PACU  Anesthesia Type:GA combined with regional for post-op pain  Level of Consciousness: awake, alert  and oriented  Airway & Oxygen Therapy: Patient Spontanous Breathing and Patient connected to nasal cannula oxygen  Post-op Assessment: Report given to RN  Post vital signs: Reviewed and stable  Last Vitals:  Vitals:   05/11/17 1231 05/11/17 1645  BP: 135/89   Pulse: 70   Resp: 14   Temp:  36.8 C    Last Pain:  Vitals:   05/11/17 1645  TempSrc:   PainSc: (P) 5          Complications: No apparent anesthesia complications

## 2017-05-12 ENCOUNTER — Encounter (HOSPITAL_COMMUNITY): Payer: Self-pay | Admitting: Orthopaedic Surgery

## 2017-05-12 LAB — CBC
HCT: 30.9 % — ABNORMAL LOW (ref 36.0–46.0)
Hemoglobin: 9.8 g/dL — ABNORMAL LOW (ref 12.0–15.0)
MCH: 29.9 pg (ref 26.0–34.0)
MCHC: 31.7 g/dL (ref 30.0–36.0)
MCV: 94.2 fL (ref 78.0–100.0)
PLATELETS: 256 10*3/uL (ref 150–400)
RBC: 3.28 MIL/uL — ABNORMAL LOW (ref 3.87–5.11)
RDW: 14.7 % (ref 11.5–15.5)
WBC: 8.1 10*3/uL (ref 4.0–10.5)

## 2017-05-12 LAB — BASIC METABOLIC PANEL
Anion gap: 11 (ref 5–15)
BUN: 8 mg/dL (ref 6–20)
CO2: 25 mmol/L (ref 22–32)
CREATININE: 0.72 mg/dL (ref 0.44–1.00)
Calcium: 8.1 mg/dL — ABNORMAL LOW (ref 8.9–10.3)
Chloride: 100 mmol/L — ABNORMAL LOW (ref 101–111)
GFR calc Af Amer: >60 mL/min (ref >60)
Glucose, Bld: 91 mg/dL (ref 65–99)
Potassium: 3.6 mmol/L (ref 3.5–5.1)
SODIUM: 136 mmol/L (ref 135–145)

## 2017-05-12 NOTE — Progress Notes (Signed)
Physical Therapy Treatment Patient Details Name: Kathy [name] MRN: [id number] DOB: [birth year] Today's Date: 05/12/2017    History of Present Illness 51 y.o. Female s/p L TKA. PHM including R TKA (2016), anemia, panic disorder, and tachycardia.    PT Comments    Pt slowly progressing towards current functional mobility goals. Pt remains limited secondary to pain and lethargy. Pt would continue to benefit from skilled physical therapy services at this time while admitted and after d/c to address the below listed limitations in order to improve overall safety and independence with functional mobility.    Follow Up Recommendations  Home health PT;Supervision/Assistance - 24 hour     Equipment Recommendations  Rolling walker with 5" wheels    Recommendations for Other Services       Precautions / Restrictions Precautions Precautions: Knee Precaution Comments: PT educated pt on position of LE following TKA sx. Restrictions Weight Bearing Restrictions: Yes LLE Weight Bearing: Weight bearing as tolerated    Mobility  Bed Mobility Overal bed mobility: Needs Assistance Bed Mobility: Supine to Sit     Supine to sit: Min assist     General bed mobility comments: increased time, min A with L LE movement  Transfers Overall transfer level: Needs assistance Equipment used: Rolling walker (2 wheeled);1 person hand held assist Transfers: Sit to/from [employer] Sit to Stand: Min guard Stand pivot transfers: Min assist       General transfer comment: increased time, cueing for technique, min guard for safety to rise into standing from bed, min A to pivot bed<>BSC x1 and bed<>recliner chair x1  Ambulation/Gait             General Gait Details: pt refusing at this time   Stairs            Wheelchair Mobility    Modified Rankin (Stroke Patients Only)       Balance Overall balance assessment: Needs assistance Sitting-balance support: No  upper extremity supported;Feet supported Sitting balance-Leahy Scale: Fair     Standing balance support: Bilateral upper extremity supported;During functional activity Standing balance-Leahy Scale: Poor Standing balance comment: pt reliant on bilateral UEs on external supports                            Cognition Arousal/Alertness: Lethargic;Suspect due to medications Behavior During Therapy: Flat affect Overall Cognitive Status: Within Functional Limits for tasks assessed                                        Exercises Total Joint Exercises Heel Slides: AAROM;Left;10 reps;Supine Hip ABduction/ADduction: AAROM;Left;10 reps;Supine Knee Flexion: AAROM;Left;10 reps;Supine Goniometric ROM: Flexion ~75 degrees, Extension lacking ~15 degrees to neutral    General Comments        Pertinent Vitals/Pain Pain Assessment: Faces Faces Pain Scale: Hurts even more Pain Location: L knee Pain Descriptors / Indicators: Sore;Grimacing;Guarding Pain Intervention(s): Monitored during session;Repositioned    Home Living Family/patient expects to be discharged to:: Private residence Living Arrangements: Alone                  Prior Function            PT Goals (current goals can now be found in the care plan section) Acute Rehab PT Goals PT Goal Formulation: With patient Time For Goal Achievement: 05/26/17 Potential to Achieve Goals:  Good Progress towards PT goals: Progressing toward goals    Frequency    7X/week      PT Plan Current plan remains appropriate    Co-evaluation              AM-PAC PT "6 Clicks" Daily Activity  Outcome Measure  Difficulty turning over in bed (including adjusting bedclothes, sheets and blankets)?: A Little Difficulty moving from lying on back to sitting on the side of the bed? : Total Difficulty sitting down on and standing up from a chair with arms (e.g., wheelchair, bedside commode, etc,.)?:  Total Help needed moving to and from a bed to chair (including a wheelchair)?: A Little Help needed walking in hospital room?: A Little Help needed climbing 3-5 steps with a railing? : A Lot 6 Click Score: 13    End of Session Equipment Utilized During Treatment: Gait belt Activity Tolerance: Patient limited by pain;Patient limited by lethargy Patient left: in chair;with call bell/phone within reach Nurse Communication: Mobility status PT Visit Diagnosis: Other abnormalities of gait and mobility (R26.89);Pain Pain - Right/Left: Left Pain - part of body: Knee     Time: 1610-9604 PT Time Calculation (min) (ACUTE ONLY): 23 min  Charges:  $Therapeutic Activity: 23-37 mins                    G Codes:       [place], PT, [place] [phone]    Alessandra Bevels Eri Mcevers 05/12/2017, 3:17 PM

## 2017-05-12 NOTE — Evaluation (Signed)
Physical Therapy Evaluation Patient Details Name: Kathy [name] MRN: [id number] DOB: 05-01-1966 Today's Date: 05/12/2017   History of Present Illness  51 y.o. Female s/p L TKA. PHM including R TKA (2016), anemia, panic disorder, and tachycardia.  Clinical Impression  Pt presented supine in bed with HOB elevated, awake and willing to participate in therapy session. Prior to admission, pt reported that she was independent with all functional mobility and ADLs. Pt currently very limited secondary to pain and lethargy (likely due to meds). Pt refusing to perform transfers at this time; however, did participate in bed mobility and L LE therex (see below). PT hopeful that with better pain management and improved alertness pt will progress to a functional level to be able to safely d/c home. Pt would continue to benefit from skilled physical therapy services at this time while admitted and after d/c to address the below listed limitations in order to improve overall safety and independence with functional mobility.     Follow Up Recommendations Home health PT;Supervision/Assistance - 24 hour    Equipment Recommendations  Rolling walker with 5" wheels    Recommendations for Other Services       Precautions / Restrictions Precautions Precautions: Knee Precaution Booklet Issued: No Precaution Comments: Pt too lethargic for education during PT evaluation; will address at next session Required Braces or Orthoses: Knee Immobilizer - Left Knee Immobilizer - Left: Other (comment) (No formal order place. KI in room upon arrival) Restrictions Weight Bearing Restrictions: Yes LLE Weight Bearing: Weight bearing as tolerated      Mobility  Bed Mobility Overal bed mobility: Needs Assistance Bed Mobility: Supine to Sit;Sit to Supine     Supine to sit: Min guard Sit to supine: Min guard   General bed mobility comments: increased time, pt only agreeable to long sitting in bed. pt very limited  secondary to lethargy throughout session  Transfers Overall transfer level: Needs assistance Equipment used: Rolling walker (2 wheeled) Transfers: Sit to/from Stand Sit to Stand: Mod assist         General transfer comment: pt refusing at this time. Per nurse tech, pt just transferred from recliner chair to bed prior to evaluation  Ambulation/Gait                Stairs            Wheelchair Mobility    Modified Rankin (Stroke Patients Only)       Balance Overall balance assessment: Needs assistance Sitting-balance support: No upper extremity supported;Feet supported Sitting balance-Leahy Scale: Fair     Standing balance support: Bilateral upper extremity supported;During functional activity Standing balance-Leahy Scale: Poor Standing balance comment: Requires UE support                             Pertinent Vitals/Pain Pain Assessment: Faces Faces Pain Scale: Hurts even more Pain Location: L knee Pain Descriptors / Indicators: Sore;Grimacing;Guarding Pain Intervention(s): Monitored during session;Repositioned    Home Living Family/patient expects to be discharged to:: Private residence Living Arrangements: Spouse/significant other Available Help at Discharge: Family;Available 24 hours/day Type of Home: Apartment Home Access: Level entry     Home Layout: One level Home Equipment: Tub bench;Bedside commode      Prior Function Level of Independence: Independent         Comments: ADLs and IADLs. 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 Pt with expected post-op decreased strength and ROM limitations LLE: Unable to fully assess due to pain    Cervical / Trunk  Assessment Cervical / Trunk Assessment: Other exceptions Cervical / Trunk Exceptions: Forward lean during mobility. Increase body habitus.  Communication   Communication: No difficulties  Cognition Arousal/Alertness: Lethargic;Suspect due to medications Behavior During Therapy: Flat affect Overall Cognitive Status: Within Functional Limits for tasks assessed                                 General Comments: Pt drowsy. Able to answer questions, maintain conversation, and follow commands.      General Comments General comments (skin integrity, edema, etc.): Pt O2 in 90s on room air. Heart rate increased ~127 during acitvity.     Exercises Total Joint Exercises Quad Sets: AROM;Strengthening;Left;10 reps;Supine Heel Slides: AAROM;Left;10 reps;Supine Hip ABduction/ADduction: AAROM;Left;10 reps;Supine Straight Leg Raises: AAROM;Left;10 reps;Supine Knee Flexion: AAROM;Left;10 reps;Supine   Assessment/Plan    PT Assessment Patient needs continued PT services  PT Problem List Decreased range of motion;Decreased strength;Decreased activity tolerance;Decreased balance;Decreased mobility;Decreased coordination;Decreased knowledge of use of DME;Decreased safety awareness;Decreased knowledge of precautions;Pain       PT Treatment Interventions DME instruction;Gait training;Stair training;Functional mobility training;Therapeutic activities;Therapeutic exercise;Neuromuscular re-education;Balance training;Patient/family education    PT Goals (Current goals can be found in the Care Plan section)  Acute Rehab PT Goals Patient Stated Goal: return home PT Goal Formulation: With patient Time For Goal Achievement: 05/26/17 Potential to Achieve Goals: Good    Frequency 7X/week   Barriers to discharge        Co-evaluation               AM-PAC PT "6 Clicks" Daily Activity  Outcome Measure Difficulty turning over in bed (including adjusting bedclothes, sheets and  blankets)?: A Lot Difficulty moving from lying on back to sitting on the side of the bed? : A Lot Difficulty sitting down on and standing up from a chair with arms (e.g., wheelchair, bedside commode, etc,.)?: Total Help needed moving to and from a bed to chair (including a wheelchair)?: Total Help needed walking in hospital room?: Total Help needed climbing 3-5 steps with a railing? : Total 6 Click Score: 8    End of Session   Activity Tolerance: Patient limited by pain;Patient limited by lethargy Patient left: in bed;in CPM;with call bell/phone within reach Nurse Communication: Mobility status PT Visit Diagnosis: Other abnormalities of gait and mobility (R26.89);Pain Pain - Right/Left: Left Pain - part of body: Knee    Time: 1030-1045 PT Time Calculation (min) (ACUTE ONLY): 15 min   Charges:   PT Evaluation $PT Eval Moderate Complexity: 1 Procedure     PT G Codes:        Deborah Chalk, PT, DPT [phone]   Alessandra Bevels Roldan Laforest 05/12/2017, 10:48 AM

## 2017-05-12 NOTE — Op Note (Signed)
NAME:  SMRITHI, PIGFORD                   ACCOUNT NO.:  MEDICAL RECORD NO.:  [id number]  LOCATION:                                 FACILITY:  PHYSICIAN:  Vanita Panda. Magnus Ivan, M.D.DATE OF BIRTH:  DATE OF PROCEDURE:  05/11/2017 DATE OF DISCHARGE:                              OPERATIVE REPORT   PREOPERATIVE DIAGNOSES:  Primary osteoarthritis, degenerative joint disease, and valgus malalignment of left knee.  POSTOPERATIVE DIAGNOSES:  Primary osteoarthritis, degenerative joint disease, and valgus malalignment of left knee.  PROCEDURE:  Left total knee arthroplasty.  IMPLANTS:  Stryker Triathlon knee with size 3 femur, size 4 tibial tray, 11-mm fix-bearing polyethylene insert, size 29 central patellar button.  SURGEON:  Vanita Panda. Magnus Ivan, M.D.  ASSISTANT:  Richardean Canal, PA-C.  ANESTHESIA: 1. Left lower extremity regional block. 2. General.  TOURNIQUET TIME:  Under 1 hour.  BLOOD LOSS:  Less than 200 mL.  COMPLICATIONS:  None.  ANTIBIOTICS:  2 g of IV Ancef.  INDICATIONS:  [name] is a 51 year old female, well known to me.  She has debilitating arthritis involving both her knees, about a year ago, underwent a successful right total knee arthroplasty.  With continued pain in her left knee as well as the failure of conservative treatment, she does wish to have this on the left side.  She has severe osteoarthritis of that left knee as well as valgus malalignment.  Risks and benefits of surgery explained to her in detail including the risk of acute blood loss anemia, nerve and vessel injury, fracture, infection, and DVT.  She understands our goals are to decrease pain, improve mobility, and overall improved quality of life.  PROCEDURE DESCRIPTION:  After informed consent was obtained, appropriate left leg was marked.  An adductor canal block was obtained by Anesthesia in the holding room.  She was then brought to the operating room, placed supine on the  operating table.  General anesthesia was then obtained.  A nonsterile tourniquet was placed around her upper left thigh and her left leg was prepped and draped from the thigh down the toes with DuraPrep and sterile drapes.  Sterile stockinette was utilized as well. With the bed raised, a time-out was called and she was identified as correct patient and correct left leg.  We then used an Esmarch to wrap out the leg and tourniquet was inflated to 300 mm of pressure.  I then made a direct midline incision of the patella and carried this proximally and distally.  We dissected down the knee joint, carried out a medial parapatellar arthrotomy finding significant cartilage loss throughout the knee.  We removed remnants of the ACL, PCL, medial and lateral meniscus when the knee in a flexed position, set our extramedullary cutting guide off the tibial crest in the tibia setting for neutral slope, corrected for varus and valgus.  We made our cut to take 9 mm off the high side.  We made our tibia cut without difficulty. We then went to the femur and used the intramedullary guide for the femur through the notch, setting our distal femoral cut at 10 mm distal femoral cut setting a rotation for 5 degrees, externally  rotated.  We made this cut without difficulty as well.  We brought the knee back down to full extension and with the 9-mm extension block, we had full extension.  We then went back to the femur and we put our femoral sizing guide based off the epicondylar axis and chose a size 3 femur.  We then made our anterior and posterior cuts followed by our chamfer cuts for size 3 femur and our femoral box cut.  We then went back to the tibia and chose a size 4 tibia based off coverage of the tibial tray and set our rotation off the femur and the tibial tubercle.  We made a keel cut off this and with the trial 4 tibia followed by the 3 femur, we went up to 11-mm fix-bearing polyethylene insert trial  because the leg gave her more stability and improved alignment.  We then drilled 3 holes for patellar button.  We then removed all trial instrumentation and irrigated the knee with normal saline solution using pulsatile lavage. We mixed our cement and cemented the real Stryker Triathlon tibial tray size 4 for left knee followed by size 4 left femur.  We went with a real 11-mm fix-bearing polyethylene insert and cemented our patellar button. Once the cement had hardened, we removed the cement debris and the tourniquet was let down.  Hemostasis was obtained with electrocautery. We then irrigated the knee again with normal saline solution.  Closed our arthrotomy with interrupted #1 Vicryl suture followed by 0 Vicryl in the deep tissue, 2-0 Vicryl in the subcutaneous tissue, and interrupted staples on the skin.  Xeroform and well-padded sterile dressing were applied.  She was awakened, extubated and taken to the recovery room in stable condition.  All final counts were correct.  There were no complications noted.  Of note, Richardean Canal, PA-C assisted in the entire case and his assistance was crucial for facilitating all aspects of this case.     Vanita Panda. Magnus Ivan, M.D.     CYB/MEDQ  D:  05/11/2017  T:  05/11/2017  Job:  [id number]

## 2017-05-12 NOTE — Progress Notes (Signed)
Subjective: 1 Day Post-Op Procedure(s) (LRB): LEFT TOTAL KNEE ARTHROPLASTY (Left) Patient reports pain as moderate.  Acute blood loss anemia, but tolerating well.  Objective: Vital signs in last 24 hours: Temp:  [98 F (36.7 C)-101.7 F (38.7 C)] 101.7 F (38.7 C) (07/18 0529) Pulse Rate:  [69-109] 109 (07/18 0529) Resp:  [12-18] 18 (07/18 0529) BP: (109-161)/(55-103) 124/55 (07/18 0529) SpO2:  [92 %-100 %] 99 % (07/18 0529) Weight:  [213 lb 11.2 oz (96.9 kg)] 213 lb 11.2 oz (96.9 kg) (07/17 1138)  Intake/Output from previous day: 07/17 0701 - 07/18 0700 In: 1990 [P.O.:240; I.V.:1750] Out: 500 [Urine:400; Blood:100] Intake/Output this shift: Total I/O In: 60 [P.O.:60] Out: -    Recent Labs  05/12/17 0300  HGB 9.8*    Recent Labs  05/12/17 0300  WBC 8.1  RBC 3.28*  HCT 30.9*  PLT 256    Recent Labs  05/12/17 0300  NA 136  K 3.6  CL 100*  CO2 25  BUN 8  CREATININE 0.72  GLUCOSE 91  CALCIUM 8.1*   No results for input(s): LABPT, INR in the last 72 hours.  Sensation intact distally Intact pulses distally Dorsiflexion/Plantar flexion intact Incision: dressing C/D/I Compartment soft  Assessment/Plan: 1 Day Post-Op Procedure(s) (LRB): LEFT TOTAL KNEE ARTHROPLASTY (Left) Up with therapy  Kathryne Hitch 05/12/2017, 6:59 AM

## 2017-05-12 NOTE — Discharge Instructions (Addendum)
Information on my medicine - XARELTO (Rivaroxaban)  This medication education was reviewed with me or my healthcare representative as part of my discharge preparation.  The pharmacist that spoke with me during my hospital stay was:  Renaee Munda, [facility]  Why was Xarelto prescribed for you? Xarelto was prescribed for you to reduce the risk of blood clots forming after orthopedic surgery. The medical term for these abnormal blood clots is venous thromboembolism (VTE).  What do you need to know about xarelto ? Take your Xarelto ONCE DAILY at the same time every day. You may take it either with or without food. INSTRUCTIONS AFTER JOINT REPLACEMENT   o Remove items at home which could result in a fall. This includes throw rugs or furniture in walking pathways o ICE to the affected joint every three hours while awake for 30 minutes at a time, for at least the first 3-5 days, and then as needed for pain and swelling.  Continue to use ice for pain and swelling. You may notice swelling that will progress down to the foot and ankle.  This is normal after surgery.  Elevate your leg when you are not up walking on it.   o Continue to use the breathing machine you got in the hospital (incentive spirometer) which will help keep your temperature down.  It is common for your temperature to cycle up and down following surgery, especially at night when you are not up moving around and exerting yourself.  The breathing machine keeps your lungs expanded and your temperature down.   DIET:  As you were doing prior to hospitalization, we recommend a well-balanced diet.  DRESSING / WOUND CARE / SHOWERING  Keep the surgical dressing until follow up.  The dressing is water proof, so you can shower without any extra covering.  IF THE DRESSING FALLS OFF or the wound gets wet inside, change the dressing with sterile gauze.  Please use good hand washing techniques before changing the dressing.  Do not use any lotions or  creams on the incision until instructed by your surgeon.    ACTIVITY  o Increase activity slowly as tolerated, but follow the weight bearing instructions below.   o No driving for 6 weeks or until further direction given by your physician.  You cannot drive while taking narcotics.  o No lifting or carrying greater than 10 lbs. until further directed by your surgeon. o Avoid periods of inactivity such as sitting longer than an hour when not asleep. This helps prevent blood clots.  o You may return to work once you are authorized by your doctor.     WEIGHT BEARING   Weight bearing as tolerated with assist device (walker, cane, etc) as directed, use it as long as suggested by your surgeon or therapist, typically at least 4-6 weeks.   EXERCISES  Results after joint replacement surgery are often greatly improved when you follow the exercise, range of motion and muscle strengthening exercises prescribed by your doctor. Safety measures are also important to protect the joint from further injury. Any time any of these exercises cause you to have increased pain or swelling, decrease what you are doing until you are comfortable again and then slowly increase them. If you have problems or questions, call your caregiver or physical therapist for advice.   Rehabilitation is important following a joint replacement. After just a few days of immobilization, the muscles of the leg can become weakened and shrink (atrophy).  These exercises are  designed to build up the tone and strength of the thigh and leg muscles and to improve motion. Often times heat used for twenty to thirty minutes before working out will loosen up your tissues and help with improving the range of motion but do not use heat for the first two weeks following surgery (sometimes heat can increase post-operative swelling).   These exercises can be done on a training (exercise) mat, on the floor, on a table or on a bed. Use whatever works the  best and is most comfortable for you.    Use music or television while you are exercising so that the exercises are a pleasant break in your day. This will make your life better with the exercises acting as a break in your routine that you can look forward to.   Perform all exercises about fifteen times, three times per day or as directed.  You should exercise both the operative leg and the other leg as well.  Exercises include:    Quad Sets - Tighten up the muscle on the front of the thigh (Quad) and hold for 5-10 seconds.    Straight Leg Raises - With your knee straight (if you were given a brace, keep it on), lift the leg to 60 degrees, hold for 3 seconds, and slowly lower the leg.  Perform this exercise against resistance later as your leg gets stronger.   Leg Slides: Lying on your back, slowly slide your foot toward your buttocks, bending your knee up off the floor (only go as far as is comfortable). Then slowly slide your foot back down until your leg is flat on the floor again.   Angel Wings: Lying on your back spread your legs to the side as far apart as you can without causing discomfort.   Hamstring Strength:  Lying on your back, push your heel against the floor with your leg straight by tightening up the muscles of your buttocks.  Repeat, but this time bend your knee to a comfortable angle, and push your heel against the floor.  You may put a pillow under the heel to make it more comfortable if necessary.   A rehabilitation program following joint replacement surgery can speed recovery and prevent re-injury in the future due to weakened muscles. Contact your doctor or a physical therapist for more information on knee rehabilitation.    CONSTIPATION  Constipation is defined medically as fewer than three stools per week and severe constipation as less than one stool per week.  Even if you have a regular bowel pattern at home, your normal regimen is likely to be disrupted due to multiple  reasons following surgery.  Combination of anesthesia, postoperative narcotics, change in appetite and fluid intake all can affect your bowels.   YOU MUST use at least one of the following options; they are listed in order of increasing strength to get the job done.  They are all available over the counter, and you may need to use some, POSSIBLY even all of these options:    Drink plenty of fluids (prune juice may be helpful) and high fiber foods Colace 100 mg by mouth twice a day  Senokot for constipation as directed and as needed Dulcolax (bisacodyl), take with full glass of water  Miralax (polyethylene glycol) once or twice a day as needed.  If you have tried all these things and are unable to have a bowel movement in the first 3-4 days after surgery call either your surgeon or  your primary doctor.    If you experience loose stools or diarrhea, hold the medications until you stool forms back up.  If your symptoms do not get better within 1 week or if they get worse, check with your doctor.  If you experience "the worst abdominal pain ever" or develop nausea or vomiting, please contact the office immediately for further recommendations for treatment.   ITCHING:  If you experience itching with your medications, try taking only a single pain pill, or even half a pain pill at a time.  You can also use Benadryl over the counter for itching or also to help with sleep.   TED HOSE STOCKINGS:  Use stockings on both legs until for at least 2 weeks or as directed by physician office. They may be removed at night for sleeping.  MEDICATIONS:  See your medication summary on the After Visit Summary that nursing will review with you.  You may have some home medications which will be placed on hold until you complete the course of blood thinner medication.  It is important for you to complete the blood thinner medication as prescribed.  PRECAUTIONS:  If you experience chest pain or shortness of breath - call  [phone] immediately for transfer to the hospital emergency department.   If you develop a fever greater that 101 F, purulent drainage from wound, increased redness or drainage from wound, foul odor from the wound/dressing, or calf pain - CONTACT YOUR SURGEON.                                                   FOLLOW-UP APPOINTMENTS:  If you do not already have a post-op appointment, please call the office for an appointment to be seen by your surgeon.  Guidelines for how soon to be seen are listed in your After Visit Summary, but are typically between 1-4 weeks after surgery.  OTHER INSTRUCTIONS:   Knee Replacement:  Do not place pillow under knee, focus on keeping the knee straight while resting. CPM instructions: 0-90 degrees, 2 hours in the morning, 2 hours in the afternoon, and 2 hours in the evening. Place foam block, curve side up under heel at all times except when in CPM or when walking.  DO NOT modify, tear, cut, or change the foam block in any way.  MAKE SURE YOU:   Understand these instructions.   Get help right away if you are not doing well or get worse.    Thank you for letting us be a part of your medical care team.  It is a privilege we respect greatly.  We hope these instructions will help you stay on track for a fast and full recovery!   If you have difficulty swallowing the tablet whole, you may crush it and mix in applesauce just prior to taking your dose.  Take Xarelto exactly as prescribed by your doctor and DO NOT stop taking Xarelto without talking to the doctor who prescribed the medication.  Stopping without other VTE prevention medication to take the place of Xarelto may increase your risk of developing a clot.  After discharge, you should have regular check-up appointments with your healthcare provider that is prescribing your Xarelto.    What do you do if you miss a dose? If you miss a dose, take it as soon as you remember  on the same day then continue your  regularly scheduled once daily regimen the next day. Do not take two doses of Xarelto on the same day.   Important Safety Information A possible side effect of Xarelto is bleeding. You should call your healthcare provider right away if you experience any of the following: ? Bleeding from an injury or your nose that does not stop. ? Unusual colored urine (red or dark brown) or unusual colored stools (red or black). ? Unusual bruising for unknown reasons. ? A serious fall or if you hit your head (even if there is no bleeding).  Some medicines may interact with Xarelto and might increase your risk of bleeding while on Xarelto. To help avoid this, consult your healthcare provider or pharmacist prior to using any new prescription or non-prescription medications, including herbals, vitamins, non-steroidal anti-inflammatory drugs (NSAIDs) and supplements.  This website has more information on Xarelto: VisitDestination.com.br.

## 2017-05-12 NOTE — Evaluation (Signed)
Occupational Therapy Evaluation Patient Details Name: Kathy [name] MRN: [id number] DOB: May 12, 1966 Today's Date: 05/12/2017    History of Present Illness 51 y.o. Female s/p L TKA. PHM including R TKA (2016), anemia, panic disorder, and tachycardia.   Clinical Impression   PTA, pt was living alone and was independent. Currently, pt requires Mod A for LB ADLs and Mod A for functional mobility using RW. Provided education on  tub transfer with tub bench; pt demonstrated understanding. Pt would benefit from acute OT to address LB ADLs and toileting. Recommend dc home with 24 hour supervision once medically stable per physician.      Follow Up Recommendations  DC plan and follow up therapy as arranged by surgeon;Supervision/Assistance - 24 hour    Equipment Recommendations  3 in 1 bedside commode    Recommendations for Other Services PT consult     Precautions / Restrictions Precautions Precautions: Knee Precaution Booklet Issued: No Precaution Comments: Educated pt on resting with knee in extension Required Braces or Orthoses: Knee Immobilizer - Left Knee Immobilizer - Left: Other (comment) (No formal order place. KI in room upon arrival) Restrictions Weight Bearing Restrictions: Yes LLE Weight Bearing: Weight bearing as tolerated      Mobility Bed Mobility               General bed mobility comments: Pt in recliner upon arrival finishing up sponge bath with NT  Transfers Overall transfer level: Needs assistance Equipment used: Rolling walker (2 wheeled) Transfers: Sit to/from Stand Sit to Stand: Mod assist         General transfer comment: Pt requiringMod A to power up into standing. VCs to stand upright and not lean over RW    Balance Overall balance assessment: Needs assistance Sitting-balance support: No upper extremity supported;Feet supported Sitting balance-Leahy Scale: Fair     Standing balance support: Bilateral upper extremity supported;During  functional activity Standing balance-Leahy Scale: Poor Standing balance comment: Requires UE support                           ADL either performed or assessed with clinical judgement   ADL Overall ADL's : Needs assistance/impaired Eating/Feeding: Set up;Sitting   Grooming: Set up;Sitting   Upper Body Bathing: Minimal assistance;Sitting   Lower Body Bathing: Moderate assistance;Sit to/from stand   Upper Body Dressing : Minimal assistance;Sitting   Lower Body Dressing: Moderate assistance;Sit to/from stand   Toilet Transfer: Minimal assistance;Ambulation;RW (Simulated to recliner)           Functional mobility during ADLs: Minimal assistance;Rolling walker General ADL Comments: Pt limited by pain and fatigue. Pt requiring increased encouragement to participate in therapy. Pt may benefit from AE for LB ADLs. Provided education on tub transfer with tub bench; pt demosntrated understanding.      Vision         Perception     Praxis      Pertinent Vitals/Pain Pain Assessment: Faces Faces Pain Scale: Hurts even more Pain Location: L knee Pain Descriptors / Indicators: Constant;Discomfort;Grimacing Pain Intervention(s): Monitored during session;Limited activity within patient's tolerance;Repositioned;Ice applied     Hand Dominance Right   Extremity/Trunk Assessment Upper Extremity Assessment Upper Extremity Assessment: Generalized weakness (Pt reports having B wrist problems with arthrtis)   Lower Extremity Assessment Lower Extremity Assessment: Defer to PT evaluation   Cervical / Trunk Assessment Cervical / Trunk Assessment: Other exceptions Cervical / Trunk Exceptions: Forward lean during mobility. Increase body habitus.  Communication Communication Communication: No difficulties   Cognition Arousal/Alertness: Lethargic;Suspect due to medications Behavior During Therapy: Flat affect Overall Cognitive Status: Within Functional Limits for tasks  assessed                                 General Comments: Pt drowsy. Able to answer questions, maintain conversation, and follow commands.   General Comments  Pt O2 in 90s on room air. Heart rate increased ~127 during acitvity.     Exercises     Shoulder Instructions      Home Living Family/patient expects to be discharged to:: Private residence Living Arrangements: Alone Available Help at Discharge: Family;Available 24 hours/day Type of Home: Apartment Home Access: Level entry     Home Layout: One level     Bathroom Shower/Tub: Tub/shower unit;Curtain   Firefighter: Standard Bathroom Accessibility: Yes How Accessible: Accessible via walker Home Equipment: Tub bench          Prior Functioning/Environment Level of Independence: Independent        Comments: ADLs and IADLs. Does not work or drive        OT Problem List: Decreased strength;Decreased range of motion;Decreased activity tolerance;Impaired balance (sitting and/or standing);Decreased safety awareness;Decreased knowledge of use of DME or AE;Pain;Obesity      OT Treatment/Interventions: Self-care/ADL training;Therapeutic exercise;Energy conservation;DME and/or AE instruction;Therapeutic activities;Patient/family education    OT Goals(Current goals can be found in the care plan section) Acute Rehab OT Goals Patient Stated Goal: Stop pain OT Goal Formulation: With patient Time For Goal Achievement: 05/26/17 Potential to Achieve Goals: Good ADL Goals Pt Will Perform Lower Body Bathing: with min guard assist;sit to/from stand;with adaptive equipment Pt Will Perform Lower Body Dressing: with min guard assist;sit to/from stand;with adaptive equipment Pt Will Transfer to Toilet: with min guard assist;bedside commode;ambulating Pt Will Perform Toileting - Clothing Manipulation and hygiene: with min guard assist;sit to/from stand  OT Frequency: Min 2X/week   Barriers to D/C:             Co-evaluation              AM-PAC PT "6 Clicks" Daily Activity     Outcome Measure Help from another person eating meals?: None Help from another person taking care of personal grooming?: A Little Help from another person toileting, which includes using toliet, bedpan, or urinal?: A Little Help from another person bathing (including washing, rinsing, drying)?: A Little Help from another person to put on and taking off regular upper body clothing?: A Little Help from another person to put on and taking off regular lower body clothing?: A Little 6 Click Score: 19   End of Session Equipment Utilized During Treatment: Gait belt;Rolling walker;Left knee immobilizer Nurse Communication: Mobility status;Other (comment) (Heart rate and pain level)  Activity Tolerance: Patient limited by fatigue;Patient limited by pain Patient left: in chair;with call bell/phone within reach  OT Visit Diagnosis: Unsteadiness on feet (R26.81);Other abnormalities of gait and mobility (R26.89);Muscle weakness (generalized) (M62.81);Pain Pain - Right/Left: Left Pain - part of body: Knee                Time: 8295-6213 OT Time Calculation (min): 35 min Charges:  OT General Charges $OT Visit: 1 Procedure OT Evaluation $OT Eval Low Complexity: 1 Procedure OT Treatments $Self Care/Home Management : 8-22 mins G-Codes:     Kathy [name] MSOT, OTR/L Acute Rehab Pager: [phone] Office: [phone]  Kathy [name]  Kathy [name] 05/12/2017, 9:48 AM

## 2017-05-12 NOTE — Anesthesia Postprocedure Evaluation (Signed)
Anesthesia Post Note  Patient: Kathy [name]  Procedure(s) Performed: Procedure(s) (LRB): LEFT TOTAL KNEE ARTHROPLASTY (Left)     Patient location during evaluation: PACU Anesthesia Type: General and Regional Level of consciousness: awake and alert Pain management: pain level controlled Vital Signs Assessment: post-procedure vital signs reviewed and stable Respiratory status: spontaneous breathing, nonlabored ventilation, respiratory function stable and patient connected to nasal cannula oxygen Cardiovascular status: blood pressure returned to baseline and stable Postop Assessment: no signs of nausea or vomiting Anesthetic complications: no    Last Vitals:  Vitals:   05/12/17 0529 05/12/17 1401  BP: (!) 124/55 117/63  Pulse: (!) 109 (!) 116  Resp: 18 19  Temp: (!) 38.7 C (!) 38 C    Last Pain:  Vitals:   05/12/17 1709  TempSrc:   PainSc: 3                  Lexi Conaty P Analaya Hoey

## 2017-05-13 MED ORDER — OXYCODONE-ACETAMINOPHEN 5-325 MG PO TABS: 1.0000 | ORAL_TABLET | ORAL | 0 refills | Status: DC | PRN

## 2017-05-13 MED ORDER — RIVAROXABAN 10 MG PO TABS: 10.0000 mg | ORAL_TABLET | Freq: Every day | ORAL | 0 refills | Status: DC

## 2017-05-13 NOTE — Discharge Summary (Signed)
Patient ID: Kathy [name] MRN: [id number] DOB/AGE: 11/24/1965 51 y.o.  Admit date: 05/11/2017 Discharge date: 05/13/2017  Admission Diagnoses:  Principal Problem:   Unilateral primary osteoarthritis, left knee Active Problems:   Status post total knee replacement, left   Discharge Diagnoses:  Same  Past Medical History:  Diagnosis Date  . Asthma    seasonal  . GERD (gastroesophageal reflux disease)   . Rheumatoid arteritis     Surgeries: Procedure(s): LEFT TOTAL KNEE ARTHROPLASTY on 05/11/2017   Consultants:   Discharged Condition: Improved  Hospital Course: Kathy [name] is an 51 y.o. female who was admitted 05/11/2017 for operative treatment ofUnilateral primary osteoarthritis, left knee. Patient has severe unremitting pain that affects sleep, daily activities, and work/hobbies. After pre-op clearance the patient was taken to the operating room on 05/11/2017 and underwent  Procedure(s): LEFT TOTAL KNEE ARTHROPLASTY.    Patient was given perioperative antibiotics: Anti-infectives    Start     Dose/Rate Route Frequency Ordered Stop   05/11/17 2030  ceFAZolin (ANCEF) IVPB 1 g/50 mL premix     1 g 100 mL/hr over 30 Minutes Intravenous Every 6 hours 05/11/17 1819 05/12/17 0300   05/11/17 1830  hydroxychloroquine (PLAQUENIL) tablet 200 mg     200 mg Oral Daily 05/11/17 1819     05/11/17 1300  ceFAZolin (ANCEF) IVPB 2g/100 mL premix     2 g 200 mL/hr over 30 Minutes Intravenous To ShortStay Surgical 05/10/17 1020 05/11/17 1445       Patient was given sequential compression devices, early ambulation, and chemoprophylaxis to prevent DVT.  Patient benefited maximally from hospital stay and there were no complications.    Recent vital signs: Patient Vitals for the past 24 hrs:  BP Temp Temp src Pulse Resp SpO2  05/13/17 0300 (!) 117/54 99.3 F (37.4 C) Oral 82 18 99 %  05/13/17 0138 - - - (!) 125 - 99 %  05/12/17 2345 - 99 F (37.2 C) Oral - - -  05/12/17 2033  140/73 (!) 103.1 F (39.5 C) Oral (!) 131 (!) 22 97 %  05/12/17 1401 117/63 (!) 100.4 F (38 C) Oral (!) 116 19 97 %     Recent laboratory studies:  Recent Labs  05/12/17 0300  WBC 8.1  HGB 9.8*  HCT 30.9*  PLT 256  NA 136  K 3.6  CL 100*  CO2 25  BUN 8  CREATININE 0.72  GLUCOSE 91  CALCIUM 8.1*     Discharge Medications:   Allergies as of 05/13/2017      Reactions   Methotrexate Derivatives Shortness Of Breath, Nausea And Vomiting   Peanut-containing Drug Products Anaphylaxis   Aspirin Nausea And Vomiting   Eggs Or Egg-derived Products Nausea And Vomiting   Tomato Rash      Medication List    STOP taking these medications   acetaminophen 650 MG CR tablet Commonly known as:  TYLENOL   traMADol 50 MG tablet Commonly known as:  ULTRAM     TAKE these medications   albuterol 108 (90 Base) MCG/ACT inhaler Commonly known as:  PROVENTIL HFA;VENTOLIN HFA Inhale 1-2 puffs into the lungs every 6 (six) hours as needed for wheezing or shortness of breath.   citalopram 40 MG tablet Commonly known as:  CELEXA TAKE ONE (1) TABLET BY MOUTH EVERY DAY   diclofenac sodium 1 % Gel Commonly known as:  VOLTAREN Apply 2 g topically 4 (four) times daily. What changed:  when to take this  reasons to take this   ferrous sulfate 325 (65 FE) MG tablet TAKE ONE (1) TABLET BY MOUTH EVERY DAY   folic acid 1 MG tablet Commonly known as:  FOLVITE TAKE ONE (1) TABLET BY MOUTH EVERY DAY   furosemide 20 MG tablet Commonly known as:  LASIX Take 20 mg by mouth daily.   hydroxychloroquine 200 MG tablet Commonly known as:  PLAQUENIL Take 200 mg by mouth daily.   omeprazole 20 MG capsule Commonly known as:  PRILOSEC Take 1 capsule (20 mg total) by mouth daily.   oxyCODONE-acetaminophen 5-325 MG tablet Commonly known as:  ROXICET Take 1-2 tablets by mouth every 4 (four) hours as needed for severe pain.   predniSONE 5 MG tablet Commonly known as:  DELTASONE TAKE ONE (1)  TABLET BY MOUTH TWO (2) TIMES DAILY WITH A MEAL   ranitidine 150 MG tablet Commonly known as:  ZANTAC Take 150 mg by mouth 2 (two) times daily. What changed:  Another medication with the same name was removed. Continue taking this medication, and follow the directions you see here.   rivaroxaban 10 MG Tabs tablet Commonly known as:  XARELTO Take 1 tablet (10 mg total) by mouth daily with breakfast.   SENNA LAX 8.6 MG tablet Generic drug:  senna TAKE ONE TABLET BY MOUTH EVERY DAY AS NEEDED FOR MILD CONSTIPATION   tiZANidine 4 MG tablet Commonly known as:  ZANAFLEX TAKE ONE TABLET BY MOUTH EVERY EIGHT HOURS AS NEEDED FOR MUSCLE SPASMS            Durable Medical Equipment        Start     Ordered   05/11/17 1820  DME 3 n 1  Once     05/11/17 1819   05/11/17 1820  DME Walker rolling  Once    Question:  Patient needs a walker to treat with the following condition  Answer:  Status post total knee replacement, left   05/11/17 1819      Diagnostic Studies: Dg Knee Left Port  Result Date: 05/11/2017 CLINICAL DATA:  Left knee replacement EXAM: PORTABLE LEFT KNEE - 1-2 VIEW COMPARISON:  07/10/2016 FINDINGS: Left knee replacement is now seen in satisfactory position. No acute bony or soft tissue abnormality is noted. IMPRESSION: Status post left knee replacement Electronically Signed   By: Alcide Clever M.D.   On: 05/11/2017 18:36    Disposition: 01-Home or Self Care  Discharge Instructions    Discharge patient    Complete by:  As directed    Discharge disposition:  01-Home or Self Care   Discharge patient date:  05/13/2017      Follow-up Information    Kathryne Hitch, MD Follow up.   Specialty:  Orthopedic Surgery Contact information: [address] [place] [address] [phone]            Signed: Kathryne Hitch 05/13/2017, 8:29 AM

## 2017-05-13 NOTE — Progress Notes (Signed)
Patient ID: Kathy [name], female   DOB: 1965-11-29, 51 y.o.   MRN: [id number] Actually doing very well.  Can be discharged to home this afternoon.

## 2017-05-13 NOTE — Progress Notes (Signed)
Occupational Therapy Treatment Patient Details Name: Kathy [name] MRN: [id number] DOB: 1966/03/30 Today's Date: 05/13/2017    History of present illness 51 y.o. Female s/p L TKA. PHM including R TKA (2016), anemia, panic disorder, and tachycardia.   OT comments  Pt progressing towards acute OT goals. Focus of session was toilet transfer/pericare and functional mobility. Also discussed LB ADL strategies. D/c plan remains appropriate.     Follow Up Recommendations  DC plan and follow up therapy as arranged by surgeon;Supervision/Assistance - 24 hour    Equipment Recommendations  3 in 1 bedside commode    Recommendations for Other Services      Precautions / Restrictions Precautions Precautions: Knee Precaution Booklet Issued: No Precaution Comments: discussed positioning Required Braces or Orthoses: Knee Immobilizer - Left Knee Immobilizer - Left: Other (comment) (No order in place, pt wearing when OT arrived) Restrictions Weight Bearing Restrictions: Yes LLE Weight Bearing: Weight bearing as tolerated       Mobility Bed Mobility Overal bed mobility: Needs Assistance Bed Mobility: Supine to Sit;Sit to Supine     Supine to sit: Min guard Sit to supine: Min guard   General bed mobility comments: Min guard for safety, requires increased time  Transfers Overall transfer level: Needs assistance Equipment used: Rolling walker (2 wheeled) Transfers: Sit to/from Stand Sit to Stand: Min guard Stand pivot transfers: Min assist       General transfer comment: min guard to min A EOB to [facility] for steadying assist with pt using one side of rw and 1 armrest on BSC. Urgent to void. min guard from recliner and BSC.      Balance Overall balance assessment: Needs assistance Sitting-balance support: No upper extremity supported;Feet supported Sitting balance-Leahy Scale: Good     Standing balance support: Bilateral upper extremity supported;During functional  activity Standing balance-Leahy Scale: Poor Standing balance comment: pt reliant on bilateral UEs on external supports                           ADL either performed or assessed with clinical judgement   ADL Overall ADL's : Needs assistance/impaired                         Toilet Transfer: Minimal assistance;Stand-pivot;BSC;RW;Min guard Toilet Transfer Details (indicate cue type and reason): Pt with urgent need to void necessitating use of BSC. Pt offered rw but used BSC armrest and 1 side of rw for support. Min A to steady balance.  Toileting- Architect and Hygiene: Min guard;Sit to/from stand Toileting - Clothing Manipulation Details (indicate cue type and reason): Pt completed pericare in standing, close min guard for balance.     Functional mobility during ADLs: Min guard;Rolling walker General ADL Comments: Pt completed transfer EOB>BSC, pericare, ambulated across room to recliner and then back to bed as detailed above. Cues for keeping rw in front of her during turns and for hand placement. Pt reports she was able to don LB dressing (underwear) sit<>stand from recliner earlier.     Vision       Perception     Praxis      Cognition Arousal/Alertness: Awake/alert Behavior During Therapy: Flat affect;WFL for tasks assessed/performed Overall Cognitive Status: Within Functional Limits for tasks assessed  Exercises Exercises: Total Joint Total Joint Exercises Ankle Circles/Pumps: AROM;Both;20 reps;Supine Quad Sets: AROM;Strengthening;Left;10 reps;Supine Heel Slides: AAROM;Left;10 reps;Supine Hip ABduction/ADduction: AAROM;Left;10 reps;Supine Straight Leg Raises: AAROM;Left;10 reps;Supine   Shoulder Instructions       General Comments      Pertinent Vitals/ Pain       Pain Assessment: Faces Pain Score: 8  Faces Pain Scale: Hurts little more Pain Location: L knee Pain  Descriptors / Indicators: Sore;Guarding Pain Intervention(s): Limited activity within patient's tolerance;Monitored during session;Repositioned;Patient requesting pain meds-RN notified  Home Living                                          Prior Functioning/Environment              Frequency  Min 2X/week        Progress Toward Goals  OT Goals(current goals can now be found in the care plan section)  Progress towards OT goals: Progressing toward goals  Acute Rehab OT Goals Patient Stated Goal: return home OT Goal Formulation: With patient Time For Goal Achievement: 05/26/17 Potential to Achieve Goals: Good ADL Goals Pt Will Perform Lower Body Bathing: with min guard assist;sit to/from stand;with adaptive equipment Pt Will Perform Lower Body Dressing: with min guard assist;sit to/from stand;with adaptive equipment Pt Will Transfer to Toilet: with min guard assist;bedside commode;ambulating Pt Will Perform Toileting - Clothing Manipulation and hygiene: with min guard assist;sit to/from stand  Plan Discharge plan remains appropriate    Co-evaluation                 AM-PAC PT "6 Clicks" Daily Activity     Outcome Measure   Help from another person eating meals?: None Help from another person taking care of personal grooming?: A Little Help from another person toileting, which includes using toliet, bedpan, or urinal?: A Little Help from another person bathing (including washing, rinsing, drying)?: A Little Help from another person to put on and taking off regular upper body clothing?: A Little Help from another person to put on and taking off regular lower body clothing?: A Little 6 Click Score: 19    End of Session Equipment Utilized During Treatment: Rolling walker;Left knee immobilizer  OT Visit Diagnosis: Unsteadiness on feet (R26.81);Other abnormalities of gait and mobility (R26.89);Muscle weakness (generalized) (M62.81);Pain Pain -  Right/Left: Left Pain - part of body: Knee   Activity Tolerance Patient limited by fatigue;Patient limited by pain   Patient Left in bed;with call bell/phone within reach;with nursing/sitter in room   Nurse Communication          Time: 4742-5956 OT Time Calculation (min): 19 min  Charges: OT General Charges $OT Visit: 1 Procedure OT Treatments $Self Care/Home Management : 8-22 mins     Pilar Grammes 05/13/2017, 1:06 PM

## 2017-05-13 NOTE — Progress Notes (Signed)
Physical Therapy Treatment Patient Details Name: Kathy [name] MRN: [id number] DOB: 11/21/1965 Today's Date: 05/13/2017    History of Present Illness 51 y.o. Female s/p L TKA. PHM including R TKA (2016), anemia, panic disorder, and tachycardia.    PT Comments    Pt demonstrates improved tolerance for mobility this session with improved alertness and ability to participate. Performed short distance gait to recliner with Min guard and pt is able to perform supine exercises for knee ROM. Pt plans to return home with her sister instead of her home at [place]. Pt will benefit from an additional PT visits in order to progress gait outside room prior to discharge.     Follow Up Recommendations  Home health PT;Supervision/Assistance - 24 hour     Equipment Recommendations  Rolling walker with 5" wheels;3in1 (PT)    Recommendations for Other Services       Precautions / Restrictions Precautions Precautions: Knee Precaution Booklet Issued: No Precaution Comments: PT educated pt on position of LE following TKA sx. Required Braces or Orthoses: Knee Immobilizer - Left Knee Immobilizer - Left: Other (comment) (No order in place, pt wearing when PT arrives) Restrictions Weight Bearing Restrictions: Yes LLE Weight Bearing: Weight bearing as tolerated    Mobility  Bed Mobility Overal bed mobility: Needs Assistance Bed Mobility: Supine to Sit     Supine to sit: Min guard     General bed mobility comments: Min guard for safety, requires increased time  Transfers Overall transfer level: Needs assistance Equipment used: Rolling walker (2 wheeled) Transfers: Sit to/from Stand Sit to Stand: Min guard         General transfer comment: Min gaurd for safety. Pt takes 2 steps and reports increased back pain and sits on BSC. MIn guard from [facility] to RW  Ambulation/Gait Ambulation/Gait assistance: Min guard Ambulation Distance (Feet): 40 Feet Assistive device: Rolling walker (2  wheeled) Gait Pattern/deviations: Step-to pattern;Decreased step length - right;Decreased stance time - left;Antalgic Gait velocity: decreased Gait velocity interpretation: Below normal speed for age/gender General Gait Details: Moderate antalgic gait, cues for sequencing and positioning within RW.    Stairs            Wheelchair Mobility    Modified Rankin (Stroke Patients Only)       Balance Overall balance assessment: Needs assistance Sitting-balance support: No upper extremity supported;Feet supported Sitting balance-Leahy Scale: Good     Standing balance support: Bilateral upper extremity supported;During functional activity Standing balance-Leahy Scale: Poor Standing balance comment: pt reliant on bilateral UEs on external supports                            Cognition Arousal/Alertness: Awake/alert Behavior During Therapy: Flat affect Overall Cognitive Status: Within Functional Limits for tasks assessed                                        Exercises Total Joint Exercises Ankle Circles/Pumps: AROM;Both;20 reps;Supine Quad Sets: AROM;Strengthening;Left;10 reps;Supine Heel Slides: AAROM;Left;10 reps;Supine Hip ABduction/ADduction: AAROM;Left;10 reps;Supine Straight Leg Raises: AAROM;Left;10 reps;Supine    General Comments        Pertinent Vitals/Pain Pain Assessment: 0-10 Pain Score: 8  Faces Pain Scale: Hurts little more Pain Location: L knee Pain Descriptors / Indicators: Sore;Grimacing;Guarding Pain Intervention(s): Monitored during session;Premedicated before session;Repositioned;Ice applied    Home Living  Prior Function            PT Goals (current goals can now be found in the care plan section) Acute Rehab PT Goals Patient Stated Goal: return home Progress towards PT goals: Progressing toward goals    Frequency    7X/week      PT Plan Current plan remains appropriate     Co-evaluation              AM-PAC PT "6 Clicks" Daily Activity  Outcome Measure  Difficulty turning over in bed (including adjusting bedclothes, sheets and blankets)?: None Difficulty moving from lying on back to sitting on the side of the bed? : None Difficulty sitting down on and standing up from a chair with arms (e.g., wheelchair, bedside commode, etc,.)?: Total Help needed moving to and from a bed to chair (including a wheelchair)?: A Little Help needed walking in hospital room?: A Little Help needed climbing 3-5 steps with a railing? : A Little 6 Click Score: 18    End of Session Equipment Utilized During Treatment: Gait belt Activity Tolerance: Patient tolerated treatment well Patient left: in chair;with call bell/phone within reach Nurse Communication: Mobility status PT Visit Diagnosis: Other abnormalities of gait and mobility (R26.89);Pain Pain - Right/Left: Left Pain - part of body: Knee     Time: 9562-1308 PT Time Calculation (min) (ACUTE ONLY): 22 min  Charges:  $Therapeutic Activity: 8-22 mins                    G Codes:       Colin Broach PT, DPT  [phone]    Roxy Manns 05/13/2017, 10:06 AM

## 2017-05-13 NOTE — Progress Notes (Signed)
Subjective: 2 Days Post-Op Procedure(s) (LRB): LEFT TOTAL KNEE ARTHROPLASTY (Left) Patient reports pain as moderate.  Denies chest pain , SOB, nausea or vomiting. States feels better than yesterday.   Objective: Vital signs in last 24 hours: Temp:  [99 F (37.2 C)-103.1 F (39.5 C)] 99.3 F (37.4 C) (07/19 0300) Pulse Rate:  [82-131] 82 (07/19 0300) Resp:  [18-22] 18 (07/19 0300) BP: (117-140)/(54-73) 117/54 (07/19 0300) SpO2:  [97 %-99 %] 99 % (07/19 0300)  Intake/Output from previous day: 07/18 0701 - 07/19 0700 In: 745 [P.O.:745] Out: -  Intake/Output this shift: No intake/output data recorded.   Recent Labs  05/12/17 0300  HGB 9.8*    Recent Labs  05/12/17 0300  WBC 8.1  RBC 3.28*  HCT 30.9*  PLT 256    Recent Labs  05/12/17 0300  NA 136  K 3.6  CL 100*  CO2 25  BUN 8  CREATININE 0.72  GLUCOSE 91  CALCIUM 8.1*   No results for input(s): LABPT, INR in the last 72 hours.  Sensation intact distally Dorsiflexion/Plantar flexion intact Incision: moderate drainage Compartment soft Minimal tenderness left calf.  Assessment/Plan: 2 Days Post-Op Procedure(s) (LRB): LEFT TOTAL KNEE ARTHROPLASTY (Left) Up with therapy  Monitor for symptoms of anemia Dressing changed.  Anticipate discharge tomorrow.   GILBERT CLARK 05/13/2017, 8:02 AM

## 2017-05-14 ENCOUNTER — Telehealth (INDEPENDENT_AMBULATORY_CARE_PROVIDER_SITE_OTHER): Payer: Self-pay | Admitting: Family

## 2017-05-14 ENCOUNTER — Other Ambulatory Visit (INDEPENDENT_AMBULATORY_CARE_PROVIDER_SITE_OTHER): Payer: Self-pay

## 2017-05-14 MED ORDER — OXYCODONE-ACETAMINOPHEN 5-325 MG PO TABS: 1.0000 | ORAL_TABLET | ORAL | 0 refills | Status: DC | PRN

## 2017-05-14 MED ORDER — RIVAROXABAN 10 MG PO TABS: 10.0000 mg | ORAL_TABLET | Freq: Every day | ORAL | 0 refills | Status: DC

## 2017-05-14 NOTE — Telephone Encounter (Signed)
IC s/w patient advised  Ok for WTB and needed to work on motion as well. She is s/p TKA

## 2017-05-14 NOTE — Telephone Encounter (Signed)
Pts sister says pt is putting pressure on leg and is asking if that's ok. Requesting a call back

## 2017-05-14 NOTE — Telephone Encounter (Signed)
Received call from patient stating she was d/c yesterday from hospital. The rx's that she was given by the nurse had blood on them. They took them to pharmacy and pharmacist would not accept b/c they were contaminated with blood. I s/w Erin and she ok'd to refill. Patient will pick up and take to pharmacy. They will bring rx's from yesterday and we will shred.

## 2017-05-17 ENCOUNTER — Other Ambulatory Visit (INDEPENDENT_AMBULATORY_CARE_PROVIDER_SITE_OTHER): Payer: Self-pay

## 2017-05-17 ENCOUNTER — Telehealth (INDEPENDENT_AMBULATORY_CARE_PROVIDER_SITE_OTHER): Payer: Self-pay | Admitting: Orthopaedic Surgery

## 2017-05-17 ENCOUNTER — Other Ambulatory Visit: Payer: Self-pay | Admitting: *Deleted

## 2017-05-17 MED ORDER — OXYCODONE-ACETAMINOPHEN 5-325 MG PO TABS: 1.0000 | ORAL_TABLET | ORAL | 0 refills | Status: DC | PRN

## 2017-05-17 NOTE — Telephone Encounter (Signed)
Patient called advised she need a bedside commode and knee walker to elevate her knee. The number to contact patient [phone]

## 2017-05-17 NOTE — Telephone Encounter (Signed)
Please advise  Knee walker?

## 2017-05-17 NOTE — Telephone Encounter (Signed)
I'm fine with the bedside commode, but am unsure about the knee walker. I'm never had someone need a knee walker after knee replacement surgery because the pressure on their knee. I'm not sure what she actually means sodium may need to find more details about this out.

## 2017-05-18 MED ORDER — TIZANIDINE HCL 4 MG PO TABS: ORAL_TABLET | ORAL | 0 refills | Status: DC

## 2017-05-18 MED ORDER — RANITIDINE HCL 150 MG PO TABS: 150.0000 mg | ORAL_TABLET | Freq: Two times a day (BID) | ORAL | 2 refills | Status: DC

## 2017-05-18 NOTE — Telephone Encounter (Signed)
Sent order for commode and shower chair to [facility]

## 2017-05-19 ENCOUNTER — Other Ambulatory Visit: Payer: Self-pay | Admitting: *Deleted

## 2017-05-19 MED ORDER — CITALOPRAM HYDROBROMIDE 40 MG PO TABS: ORAL_TABLET | ORAL | 1 refills | Status: DC

## 2017-05-20 ENCOUNTER — Telehealth (INDEPENDENT_AMBULATORY_CARE_PROVIDER_SITE_OTHER): Payer: Self-pay | Admitting: Orthopaedic Surgery

## 2017-05-20 NOTE — Telephone Encounter (Signed)
VSOC Confirmation faxed 05/20/17

## 2017-05-24 ENCOUNTER — Ambulatory Visit (INDEPENDENT_AMBULATORY_CARE_PROVIDER_SITE_OTHER): Payer: Medicaid Other | Admitting: Orthopaedic Surgery

## 2017-05-24 DIAGNOSIS — Z96652 Presence of left artificial knee joint: Secondary | ICD-10-CM

## 2017-05-24 MED ORDER — OXYCODONE-ACETAMINOPHEN 5-325 MG PO TABS: 1.0000 | ORAL_TABLET | ORAL | 0 refills | Status: DC | PRN

## 2017-05-24 NOTE — Progress Notes (Signed)
The patient is now 13 days status post a left total knee arthroplasty and 23 months status a right total knee. She's been home with home therapy and is ready to transition to outpatient physical therapy. She is doing well overall. She has been on the blood thinner Xarelto. She is requesting a refill on pain medication going into physical therapy and I agree with this as well.  On examination of her left knee the incision looks good so the staples can be removed and stitches applied. Her calf is soft. She lacks full extension by about 3-5 and has flexion to 90.  At this point we'll work on setting her up for outpatient physical therapy at [facility] to outpatient therapy on [address] She has been there before and is done well with therapy on her right knee. QUESTIONS were encouraged and answered. We will see her back in 4 weeks to see how she doing overall.

## 2017-05-25 ENCOUNTER — Other Ambulatory Visit (INDEPENDENT_AMBULATORY_CARE_PROVIDER_SITE_OTHER): Payer: Self-pay

## 2017-05-25 DIAGNOSIS — Z96652 Presence of left artificial knee joint: Secondary | ICD-10-CM

## 2017-06-01 ENCOUNTER — Ambulatory Visit: Payer: Medicaid Other | Attending: Orthopaedic Surgery | Admitting: Physical Therapy

## 2017-06-01 ENCOUNTER — Encounter: Payer: Self-pay | Admitting: Physical Therapy

## 2017-06-01 DIAGNOSIS — R262 Difficulty in walking, not elsewhere classified: Secondary | ICD-10-CM | POA: Diagnosis present

## 2017-06-01 DIAGNOSIS — M25662 Stiffness of left knee, not elsewhere classified: Secondary | ICD-10-CM | POA: Insufficient documentation

## 2017-06-01 DIAGNOSIS — M6281 Muscle weakness (generalized): Secondary | ICD-10-CM | POA: Diagnosis present

## 2017-06-01 DIAGNOSIS — M25562 Pain in left knee: Secondary | ICD-10-CM | POA: Diagnosis present

## 2017-06-01 DIAGNOSIS — Z96652 Presence of left artificial knee joint: Secondary | ICD-10-CM | POA: Insufficient documentation

## 2017-06-01 NOTE — Therapy (Signed)
[facility] Outpatient Rehabilitation [facility] [address] [place], [place], [address] Phone: [phone]   Fax:  [phone]  Physical Therapy Evaluation  Patient Details  Name: Kathy [name] MRN: [id number] Date of Birth: 1965-11-14 Referring Provider: Kathryne Hitch, MD  Encounter Date: 06/01/2017      PT End of Session - 06/01/17 1503    Visit Number 1   Authorization Type Medicaid, waiting for auth   PT Start Time 1503   PT Stop Time 1548   PT Time Calculation (min) 45 min   Activity Tolerance Patient tolerated treatment well   Behavior During Therapy [facility] for tasks assessed/performed      Past Medical History:  Diagnosis Date  . Asthma    seasonal  . GERD (gastroesophageal reflux disease)   . Rheumatoid arteritis     Past Surgical History:  Procedure Laterality Date  . ABDOMINAL HYSTERECTOMY    . TOTAL KNEE ARTHROPLASTY Right 06/18/2015   Procedure: RIGHT TOTAL KNEE ARTHROPLASTY;  Surgeon: Kathryne Hitch, MD;  [facility];  Service: Orthopedics;  Laterality: Right;  . TOTAL KNEE ARTHROPLASTY Left 05/11/2017   Procedure: LEFT TOTAL KNEE ARTHROPLASTY;  Surgeon: Kathryne Hitch, MD;  Location: [facility] OR;  Service: Orthopedics;  Laterality: Left;    There were no vitals filed for this visit.       Subjective Assessment - 06/01/17 1510    Subjective L knee is doing alright. Feels different walking around. Had 5 home health visits prior to coming to treatment today. Has RA. Notices night time pain.    How long can you walk comfortably? 15-20 min   Patient Stated Goals walk straight, decrease pain   Currently in Pain? Yes   Pain Score 3    Pain Location Knee   Pain Orientation Left   Pain Descriptors / Indicators Aching   Pain Type Surgical pain   Pain Frequency Constant   Aggravating Factors  night time pain, walking for longer times, getting out of recliner   Pain Relieving Factors ice            [facility] PT  Assessment - 06/01/17 0001      Assessment   Medical Diagnosis s/p L TKA   Referring Provider Kathryne Hitch, MD   Onset Date/Surgical Date 05/11/17   Hand Dominance Right   Next MD Visit 8/25   Prior Therapy home health 5 visits     Precautions   Precaution Comments RA     Restrictions   Weight Bearing Restrictions No     Balance Screen   Has the patient fallen in the past 6 months No     Home Environment   Living Environment Private residence   Living Arrangements Alone   Additional Comments no stairs at home     Prior Function   Level of Independence Independent     Cognition   Overall Cognitive Status Within Functional Limits for tasks assessed     Observation/Other Assessments   Focus on Therapeutic Outcomes (FOTO)  49% limited     Sensation   Additional Comments WFL     ROM / Strength   AROM / PROM / Strength PROM;Strength     PROM   PROM Assessment Site Knee   Right/Left Knee Left   Left Knee Extension -23  R -25   Left Knee Flexion 102     Strength   Strength Assessment Site Knee;Hip   Right/Left Hip Left   Left Hip ABduction  3+/5   Right/Left Knee Left   Left Knee Flexion 5/5   Left Knee Extension 5/5     Ambulation/Gait   Gait Comments uses single point cane; bilat hips & knees in flexed position            Objective measurements completed on examination: See above findings.          [facility] Adult PT Treatment/Exercise - 06/01/17 0001      Exercises   Exercises Knee/Hip     Knee/Hip Exercises: Stretches   Passive Hamstring Stretch Limitations supine with green strap   Knee: Self-Stretch Limitations heel slides with green strap     Knee/Hip Exercises: Aerobic   Stationary Bike 5 min able to make full revolutions, multiple small rest breaks     Knee/Hip Exercises: Supine   Bridges with Ball Squeeze 15 reps   Straight Leg Raises 10 reps     Knee/Hip Exercises: Sidelying   Hip ABduction 10 reps     Modalities    Modalities Cryotherapy     Cryotherapy   Number Minutes Cryotherapy 5 Minutes  pt requested to end early   Cryotherapy Location Knee   Type of Cryotherapy Ice pack                PT Education - 06/01/17 1505    Education provided Yes   Education Details anatomy of condition, POC, HEP, exercise form/rationale   Person(s) Educated Patient   Methods Explanation;Demonstration;Tactile cues;Verbal cues;Handout   Comprehension Verbalized understanding;Returned demonstration;Verbal cues required;Tactile cues required;Need further instruction             PT Long Term Goals - 06/01/17 1731      PT LONG TERM GOAL #1   Title Gross hip strength 4+/5 bilaterally   Baseline hip abduction 3+/5   Time 4   Period Weeks   Status New   Target Date 07/02/17     PT LONG TERM GOAL #2   Title Pt will be independent with final HEP for long term strengthening/stretching   Baseline will progress as appropriate   Time 4   Period Weeks   Status New   Target Date 07/02/17     PT LONG TERM GOAL #3   Title FOTO to 40% limitation to indicate significant improvement in functional ability   Baseline 49% limitation at eval   Time 4   Period Weeks   Status New   Target Date 07/02/17     PT LONG TERM GOAL #4   Title Pt will verbalize feeling that she can "walk straight" to improve safety and confidence in ambulation   Baseline does not feel straight at eval   Time 4   Period Weeks   Status New                Plan - 06/01/17 1508    Clinical Impression Statement s/p L TKA on 05/11/17 CPT 27447. pt has had 5 visits of home health PT. Pt with significant flexion contracture in hips and knees bilaterally resulting in crouched posture. pt has minimal pain and denies feeling significantly off balance since TKA. Will benefit from skilled PT to improve gross LE strength for improved functional use.    History and Personal Factors relevant to plan of care: RA, anemia   Clinical  Presentation Stable   Clinical Presentation due to: n/a   Clinical Decision Making Low   Rehab Potential Good   PT Frequency 2x / week   PT Duration  4 weeks   PT Treatment/Interventions ADLs/Self Care Home Management;Cryotherapy;Electrical Stimulation;Functional mobility training;Stair training;Gait training;Moist Heat;Therapeutic activities;Therapeutic exercise;Balance training;Neuromuscular re-education;Patient/family education;Passive range of motion;Scar mobilization;Manual techniques;Dry needling;Taping   PT Next Visit Plan bike/nu step, gastroc stretching, hip flexor stretch   PT Home Exercise Plan heel slides, hamstring stretch, hip abduction, SLR   Consulted and Agree with Plan of Care Patient      Patient will benefit from skilled therapeutic intervention in order to improve the following deficits and impairments:  Abnormal gait, Decreased range of motion, Difficulty walking, Increased muscle spasms, Decreased activity tolerance, Pain, Impaired flexibility, Decreased scar mobility, Decreased balance, Decreased strength, Decreased mobility  Visit Diagnosis: Status post total knee replacement, left - Plan: PT plan of care cert/re-cert  Acute pain of left knee - Plan: PT plan of care cert/re-cert  Stiffness of left knee, not elsewhere classified - Plan: PT plan of care cert/re-cert  Muscle weakness (generalized) - Plan: PT plan of care cert/re-cert  Difficulty in walking, not elsewhere classified - Plan: PT plan of care cert/re-cert     Problem List Patient Active Problem List   Diagnosis Date Noted  . Status post total knee replacement, left 05/11/2017  . Unilateral primary osteoarthritis, left knee 03/11/2017  . Back strain 08/13/2016  . Left knee pain 07/06/2016  . Panic disorder 07/23/2015  . Rheumatoid arthritis of right knee 06/18/2015  . Status post total right knee replacement 06/18/2015  . Tachycardia 04/04/2015  . Osteoarthritis of acromioclavicular joint  03/15/2015  . Tingling 03/15/2015  . Malnutrition of moderate degree (HCC) 09/20/2014  . Rheumatoid arthritis flare (HCC) 09/18/2014  . Hypokalemia 09/18/2014  . Loss of weight 09/18/2014  . Anorexia 09/18/2014  . Anemia 09/18/2014  . TROCHANTERIC BURSITIS, RIGHT 05/20/2010  . VITAMIN D DEFICIENCY 02/16/2010  . COUGH 02/04/2010  . Rheumatoid arthritis(714.0) 09/15/2009  . DEGENERATIVE JOINT DISEASE, GENERALIZED 07/28/2007    Garo Heidelberg C. Boby Eyer PT, DPT 06/01/17 5:40 PM   [facility] Outpatient Rehabilitation [facility] [address] [place], [place], [address] Phone: [phone]   Fax:  [phone]  Name: AJAYA CRUTCHFIELD MRN: [id number] Date of Birth: [birth year]

## 2017-06-07 ENCOUNTER — Other Ambulatory Visit: Payer: Self-pay | Admitting: *Deleted

## 2017-06-08 ENCOUNTER — Telehealth: Payer: Self-pay | Admitting: Internal Medicine

## 2017-06-08 MED ORDER — FUROSEMIDE 20 MG PO TABS: 20.0000 mg | ORAL_TABLET | Freq: Every day | ORAL | 3 refills | Status: DC

## 2017-06-08 MED ORDER — TIZANIDINE HCL 4 MG PO TABS: ORAL_TABLET | ORAL | 0 refills | Status: DC

## 2017-06-08 MED ORDER — CITALOPRAM HYDROBROMIDE 40 MG PO TABS: ORAL_TABLET | ORAL | 1 refills | Status: DC

## 2017-06-08 MED ORDER — RANITIDINE HCL 150 MG PO TABS: 150.0000 mg | ORAL_TABLET | Freq: Two times a day (BID) | ORAL | 2 refills | Status: DC

## 2017-06-15 ENCOUNTER — Encounter: Payer: Self-pay | Admitting: Physical Therapy

## 2017-06-15 ENCOUNTER — Ambulatory Visit: Payer: Medicaid Other | Admitting: Physical Therapy

## 2017-06-15 DIAGNOSIS — R262 Difficulty in walking, not elsewhere classified: Secondary | ICD-10-CM

## 2017-06-15 DIAGNOSIS — M6281 Muscle weakness (generalized): Secondary | ICD-10-CM

## 2017-06-15 DIAGNOSIS — Z96652 Presence of left artificial knee joint: Secondary | ICD-10-CM | POA: Diagnosis not present

## 2017-06-15 DIAGNOSIS — M25562 Pain in left knee: Secondary | ICD-10-CM

## 2017-06-15 DIAGNOSIS — M25662 Stiffness of left knee, not elsewhere classified: Secondary | ICD-10-CM

## 2017-06-15 NOTE — Therapy (Signed)
[facility] Outpatient Rehabilitation [facility] [address] [place], [place], [address] Phone: [phone]   Fax:  [phone]  Physical Therapy Treatment  Patient Details  Name: Kathy [name] MRN: [id number] Date of Birth: 04-06-1966 Referring Provider: Kathryne Hitch, MD  Encounter Date: 06/15/2017      PT End of Session - 06/15/17 1417    Visit Number 2   Number of Visits 6   Date for PT Re-Evaluation 07/02/17   Authorization Type Medicaid, 5 visits 8/21-9/17   PT Start Time 1417   PT Stop Time 1506   PT Time Calculation (min) 49 min   Activity Tolerance Patient tolerated treatment well   Behavior During Therapy [facility] for tasks assessed/performed      Past Medical History:  Diagnosis Date  . Asthma    seasonal  . GERD (gastroesophageal reflux disease)   . Rheumatoid arteritis     Past Surgical History:  Procedure Laterality Date  . ABDOMINAL HYSTERECTOMY    . TOTAL KNEE ARTHROPLASTY Right 06/18/2015   Procedure: RIGHT TOTAL KNEE ARTHROPLASTY;  Surgeon: Kathryne Hitch, MD;  Location: [facility] OR;  Service: Orthopedics;  Laterality: Right;  . TOTAL KNEE ARTHROPLASTY Left 05/11/2017   Procedure: LEFT TOTAL KNEE ARTHROPLASTY;  Surgeon: Kathryne Hitch, MD;  Location: [facility] OR;  Service: Orthopedics;  Laterality: Left;    There were no vitals filed for this visit.      Subjective Assessment - 06/15/17 1421    Subjective Reports feeling okay, better than it was last week. arthritis is messing with me bad. still has pain at night (7-8/10)   Patient Stated Goals walk straight, decrease pain   Currently in Pain? Yes   Pain Score 1    Pain Location Knee   Pain Orientation Left   Pain Descriptors / Indicators Sore;Aching   Aggravating Factors  night pain   Pain Relieving Factors ice                         [facility] Adult PT Treatment/Exercise - 06/15/17 0001      Knee/Hip Exercises: Stretches   Passive Hamstring  Stretch Limitations seated edge of bed   Hip Flexor Stretch Limitations thomas test position   Knee: Self-Stretch Limitations heel slides with green strap   Gastroc Stretch 2 reps;30 seconds   Gastroc Stretch Limitations slant board     Knee/Hip Exercises: Aerobic   Stationary Bike 5 min, full revolutions     Knee/Hip Exercises: Seated   Long Arc Quad 15 reps     Knee/Hip Exercises: Supine   Bridges with Clamshell 20 reps   Straight Leg Raises 10 reps   Straight Leg Raises Limitations 3 ankle pumps with each  raise     Knee/Hip Exercises: Sidelying   Clams x30 each     Knee/Hip Exercises: Prone   Hamstring Curl Limitations alt HS curl     Cryotherapy   Number Minutes Cryotherapy 10 Minutes   Cryotherapy Location Knee   Type of Cryotherapy Ice pack                PT Education - 06/15/17 1457    Education provided Yes   Education Details exercise form/rationale, HEP, heel/toe gait   Person(s) Educated Patient   Methods Explanation;Demonstration;Tactile cues;Verbal cues;Handout   Comprehension Returned demonstration;Verbal cues required;Verbalized understanding;Tactile cues required;Need further instruction             PT Long Term Goals - 06/01/17  1731      PT LONG TERM GOAL #1   Title Gross hip strength 4+/5 bilaterally   Baseline hip abduction 3+/5   Time 4   Period Weeks   Status New   Target Date 07/02/17     PT LONG TERM GOAL #2   Title Pt will be independent with final HEP for long term strengthening/stretching   Baseline will progress as appropriate   Time 4   Period Weeks   Status New   Target Date 07/02/17     PT LONG TERM GOAL #3   Title FOTO to 40% limitation to indicate significant improvement in functional ability   Baseline 49% limitation at eval   Time 4   Period Weeks   Status New   Target Date 07/02/17     PT LONG TERM GOAL #4   Title Pt will verbalize feeling that she can "walk straight" to improve safety and confidence in  ambulation   Baseline does not feel straight at eval   Time 4   Period Weeks   Status New               Plan - 06/15/17 1437    Clinical Impression Statement Good tolerance to exercise today. Discmfort due to hot flash that began in the beginning of treatment. Good flexion range but limited extension bilaterally. Valgus posture at knees which I educated on importance of lateral hip strength to support but will not fully correct.    PT Treatment/Interventions ADLs/Self Care Home Management;Cryotherapy;Electrical Stimulation;Functional mobility training;Stair training;Gait training;Moist Heat;Therapeutic activities;Therapeutic exercise;Balance training;Neuromuscular re-education;Patient/family education;Passive range of motion;Scar mobilization;Manual techniques;Dry needling;Taping   PT Next Visit Plan hip abductor strength, hip flexor stretch, glut strengthening   PT Home Exercise Plan heel slides, hamstring stretch, hip abduction, SLR   Consulted and Agree with Plan of Care Patient      Patient will benefit from skilled therapeutic intervention in order to improve the following deficits and impairments:  Abnormal gait, Decreased range of motion, Difficulty walking, Increased muscle spasms, Decreased activity tolerance, Pain, Impaired flexibility, Decreased scar mobility, Decreased balance, Decreased strength, Decreased mobility  Visit Diagnosis: Acute pain of left knee  Stiffness of left knee, not elsewhere classified  Muscle weakness (generalized)  Difficulty in walking, not elsewhere classified     Problem List Patient Active Problem List   Diagnosis Date Noted  . Status post total knee replacement, left 05/11/2017  . Unilateral primary osteoarthritis, left knee 03/11/2017  . Back strain 08/13/2016  . Left knee pain 07/06/2016  . Panic disorder 07/23/2015  . Rheumatoid arthritis of right knee 06/18/2015  . Status post total right knee replacement 06/18/2015  .  Tachycardia 04/04/2015  . 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 Tingling 03/15/2015  . Malnutrition of moderate degree (HCC) 09/20/2014  . Rheumatoid arthritis flare (HCC) 09/18/2014  . Hypokalemia 09/18/2014  . Loss of weight 09/18/2014  . Anorexia 09/18/2014  . Anemia 09/18/2014  . TROCHANTERIC BURSITIS, RIGHT 05/20/2010  . VITAMIN D DEFICIENCY 02/16/2010  . COUGH 02/04/2010  . Rheumatoid arthritis(714.0) 09/15/2009  . DEGENERATIVE JOINT DISEASE, GENERALIZED 07/28/2007    Caroljean Monsivais C. Ismar Yabut PT, DPT 06/15/17 5:39 PM   [facility] [address] [place], [place], [address] Phone: [phone]   Fax:  [phone]  Name: Kathy [name] MRN: [id number] Date of Birth: 08/23/1966

## 2017-06-18 ENCOUNTER — Other Ambulatory Visit: Payer: Self-pay | Admitting: *Deleted

## 2017-06-18 MED ORDER — OMEPRAZOLE 20 MG PO CPDR: 20.0000 mg | DELAYED_RELEASE_CAPSULE | Freq: Every day | ORAL | 1 refills | Status: DC

## 2017-06-22 ENCOUNTER — Encounter: Payer: Self-pay | Admitting: Physical Therapy

## 2017-06-22 ENCOUNTER — Ambulatory Visit: Payer: Medicaid Other | Admitting: Physical Therapy

## 2017-06-22 DIAGNOSIS — Z96652 Presence of left artificial knee joint: Secondary | ICD-10-CM | POA: Diagnosis not present

## 2017-06-22 DIAGNOSIS — M25562 Pain in left knee: Secondary | ICD-10-CM

## 2017-06-22 DIAGNOSIS — M25662 Stiffness of left knee, not elsewhere classified: Secondary | ICD-10-CM

## 2017-06-22 DIAGNOSIS — M6281 Muscle weakness (generalized): Secondary | ICD-10-CM

## 2017-06-22 NOTE — Therapy (Addendum)
[place], [place], [address] Phone: [phone]   Fax:  [phone]  Physical Therapy Treatment/discharge Summary  Patient Details  Name: Kathy [name] MRN: [id number] Date of Birth: [birth year] Referring Provider: Mcarthur Rossetti, MD  Encounter Date: 06/22/2017      PT End of Session - 06/22/17 1332    Visit Number 3   Number of Visits 6   Date for PT Re-Evaluation 07/02/17   Authorization Type Medicaid, 5 visits 8/21-9/17   PT Start Time 1332   PT Stop Time 1422   PT Time Calculation (min) 50 min   Activity Tolerance Patient tolerated treatment well   Behavior During Therapy [facility] for tasks assessed/performed      Past Medical History:  Diagnosis Date  . Asthma    seasonal  . GERD (gastroesophageal reflux disease)   . Rheumatoid arteritis     Past Surgical History:  Procedure Laterality Date  . ABDOMINAL HYSTERECTOMY    . TOTAL KNEE ARTHROPLASTY Right 06/18/2015   Procedure: RIGHT TOTAL KNEE ARTHROPLASTY;  Surgeon: Mcarthur Rossetti, MD;  Location: [place];  Service: Orthopedics;  Laterality: Right;  . TOTAL KNEE ARTHROPLASTY Left 05/11/2017   Procedure: LEFT TOTAL KNEE ARTHROPLASTY;  Surgeon: Mcarthur Rossetti, MD;  Location: [place];  Service: Orthopedics;  Laterality: Left;    There were no vitals filed for this visit.      Subjective Assessment - 06/22/17 1335    Subjective Feels okay during the day, most pain at night. Ice pack to ease the pain   Patient Stated Goals walk straight, decrease pain   Currently in Pain? No/denies                         [facility] Adult PT Treatment/Exercise - 06/22/17 0001      Knee/Hip Exercises: Stretches   Passive Hamstring Stretch Limitations seated edge of bed   Hip Flexor Stretch Limitations thomas test position   Gastroc Stretch 2 reps;30 seconds   Gastroc Stretch Limitations slant board     Knee/Hip Exercises:  Aerobic   Stationary Bike 5 min, full revolutions   Nustep 5 min L3     Knee/Hip Exercises: Standing   Heel Raises Limitations toes in turnout   Hip Abduction Both;20 reps;Knee straight   Step Down Limitations 4" step single leg, both x10   Other Standing Knee Exercises counter squats     Knee/Hip Exercises: Supine   Bridges with Clamshell 20 reps  blue tband   Straight Leg Raises Both;15 reps     Knee/Hip Exercises: Sidelying   Clams x30 each     Cryotherapy   Number Minutes Cryotherapy 10 Minutes   Cryotherapy Location Knee   Type of Cryotherapy Ice pack                PT Education - 06/22/17 1413    Education provided Yes   Education Details exercise form/rationale,  soreness to be expected, functional use of strength gained from exercises, monitored rest breaks   Person(s) Educated Patient   Methods Explanation;Demonstration;Tactile cues;Verbal cues   Comprehension Verbalized understanding;Returned demonstration;Tactile cues required;Verbal cues required;Need further instruction             PT Long Term Goals - 06/01/17 1731      PT LONG TERM GOAL #1   Title Gross hip strength 4+/5 bilaterally   Baseline hip abduction 3+/5   Time 4  Period Weeks   Status New   Target Date 07/02/17     PT LONG TERM GOAL #2   Title Pt will be independent with final HEP for long term strengthening/stretching   Baseline will progress as appropriate   Time 4   Period Weeks   Status New   Target Date 07/02/17     PT LONG TERM GOAL #3   Title FOTO to 40% limitation to indicate significant improvement in functional ability   Baseline 49% limitation at eval   Time 4   Period Weeks   Status New   Target Date 07/02/17     PT LONG TERM GOAL #4   Title Pt will verbalize feeling that she can "walk straight" to improve safety and confidence in ambulation   Baseline does not feel straight at eval   Time 4   Period Weeks   Status New               Plan -  06/22/17 1344    Clinical Impression Statement pt required rest breaks while on bike for 5 min due to reported fatigue, challenged endurance futher on nu step immediately after bike. Multiple rest breaks required throughout treatment today. Increased pain with squats noted. Heavy cuing required for form.    PT Treatment/Interventions ADLs/Self Care Home Management;Cryotherapy;Electrical Stimulation;Functional mobility training;Stair training;Gait training;Moist Heat;Therapeutic activities;Therapeutic exercise;Balance training;Neuromuscular re-education;Patient/family education;Passive range of motion;Scar mobilization;Manual techniques;Dry needling;Taping   PT Next Visit Plan hip abductor strength, hip flexor stretch, glut strengthening   PT Home Exercise Plan heel slides, hamstring stretch, hip abduction, SLR   Consulted and Agree with Plan of Care Patient      Patient will benefit from skilled therapeutic intervention in order to improve the following deficits and impairments:  Abnormal gait, Decreased range of motion, Difficulty walking, Increased muscle spasms, Decreased activity tolerance, Pain, Impaired flexibility, Decreased scar mobility, Decreased balance, Decreased strength, Decreased mobility  Visit Diagnosis: Acute pain of left knee  Stiffness of left knee, not elsewhere classified  Muscle weakness (generalized)     Problem List Patient Active Problem List   Diagnosis Date Noted  . Status post total knee replacement, left 05/11/2017  . Unilateral primary osteoarthritis, left knee 03/11/2017  . Back strain 08/13/2016  . Left knee pain 07/06/2016  . Panic disorder 07/23/2015  . Rheumatoid arthritis of right knee 06/18/2015  . Status post total right knee replacement 06/18/2015  . Tachycardia 04/04/2015  . 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 Tingling 03/15/2015  . Malnutrition of moderate degree ([place]) 09/20/2014  . Rheumatoid arthritis flare ([place])  09/18/2014  . Hypokalemia 09/18/2014  . Loss of weight 09/18/2014  . Anorexia 09/18/2014  . Anemia 09/18/2014  . TROCHANTERIC BURSITIS, RIGHT 05/20/2010  . VITAMIN D DEFICIENCY 02/16/2010  . COUGH 02/04/2010  . Rheumatoid arthritis(714.0) 09/15/2009  . DEGENERATIVE JOINT DISEASE, GENERALIZED 07/28/2007    Edy Belt C. Jamell Opfer PT, DPT 06/22/17 2:14 PM   [facility] [address] [place], [place], [address] Phone: [phone]   Fax:  [phone]  Name: ALBIRTA RHINEHART MRN: [id number] Date of Birth: Apr 25, 1966   PHYSICAL THERAPY DISCHARGE SUMMARY  Visits from Start of Care: 3  Current functional level related to goals / functional outcomes: See above   Remaining deficits: See above   Education / Equipment: Anatomy of condition, POC, HEP, exercise form/rationale  Plan: Patient agrees to discharge.  Patient goals were not met. Patient is being discharged due to not returning since  the last visit.  ?????    Pt has not returned since last visit and Medicaid authorization is expired.   Kelcey Wickstrom C. Allis Quirarte PT, DPT 07/28/17 11:02 AM

## 2017-06-24 ENCOUNTER — Ambulatory Visit (INDEPENDENT_AMBULATORY_CARE_PROVIDER_SITE_OTHER): Payer: Medicaid Other | Admitting: Orthopaedic Surgery

## 2017-07-20 ENCOUNTER — Other Ambulatory Visit: Payer: Self-pay | Admitting: Internal Medicine

## 2017-07-21 ENCOUNTER — Other Ambulatory Visit: Payer: Self-pay | Admitting: *Deleted

## 2017-07-21 MED ORDER — CITALOPRAM HYDROBROMIDE 40 MG PO TABS: ORAL_TABLET | ORAL | 1 refills | Status: DC

## 2017-07-21 NOTE — Telephone Encounter (Signed)
Patient also would like PCP to give her a call regarding the denial of tizanidine refill.  Number on file is correct.   Clovis Pu, RN

## 2017-07-22 ENCOUNTER — Other Ambulatory Visit: Payer: Self-pay | Admitting: Internal Medicine

## 2017-07-22 NOTE — Telephone Encounter (Signed)
Please let patient know that I have prescribed 15 pills of Zanaflex. She needs to make an appointment with me or her orthopedic provider for more.

## 2017-07-26 NOTE — Telephone Encounter (Signed)
Left message for patient to call back  

## 2017-07-27 DIAGNOSIS — Z79899 Other long term (current) drug therapy: Secondary | ICD-10-CM | POA: Diagnosis not present

## 2017-07-27 DIAGNOSIS — M069 Rheumatoid arthritis, unspecified: Secondary | ICD-10-CM | POA: Diagnosis not present

## 2017-07-27 DIAGNOSIS — H5203 Hypermetropia, bilateral: Secondary | ICD-10-CM | POA: Diagnosis not present

## 2017-07-27 DIAGNOSIS — H524 Presbyopia: Secondary | ICD-10-CM | POA: Diagnosis not present

## 2017-07-27 DIAGNOSIS — H52203 Unspecified astigmatism, bilateral: Secondary | ICD-10-CM | POA: Diagnosis not present

## 2017-08-02 ENCOUNTER — Other Ambulatory Visit: Payer: Self-pay | Admitting: Internal Medicine

## 2017-08-12 ENCOUNTER — Ambulatory Visit: Payer: Medicaid Other | Admitting: Internal Medicine

## 2017-08-13 ENCOUNTER — Ambulatory Visit (INDEPENDENT_AMBULATORY_CARE_PROVIDER_SITE_OTHER): Payer: Medicaid Other | Admitting: Internal Medicine

## 2017-08-13 ENCOUNTER — Encounter: Payer: Self-pay | Admitting: Internal Medicine

## 2017-08-13 VITALS — BP 122/86 | HR 93 | Temp 98.8°F | Ht 65.0 in | Wt 210.0 lb

## 2017-08-13 DIAGNOSIS — Z1159 Encounter for screening for other viral diseases: Secondary | ICD-10-CM | POA: Diagnosis not present

## 2017-08-13 DIAGNOSIS — M069 Rheumatoid arthritis, unspecified: Secondary | ICD-10-CM

## 2017-08-13 DIAGNOSIS — Z1231 Encounter for screening mammogram for malignant neoplasm of breast: Secondary | ICD-10-CM | POA: Diagnosis not present

## 2017-08-13 DIAGNOSIS — Z0000 Encounter for general adult medical examination without abnormal findings: Secondary | ICD-10-CM | POA: Diagnosis not present

## 2017-08-13 DIAGNOSIS — F410 Panic disorder [episodic paroxysmal anxiety] without agoraphobia: Secondary | ICD-10-CM

## 2017-08-13 DIAGNOSIS — D649 Anemia, unspecified: Secondary | ICD-10-CM | POA: Diagnosis not present

## 2017-08-13 MED ORDER — TIZANIDINE HCL 4 MG PO TABS: ORAL_TABLET | ORAL | 0 refills | Status: DC

## 2017-08-13 MED ORDER — CITALOPRAM HYDROBROMIDE 40 MG PO TABS: ORAL_TABLET | ORAL | 1 refills | Status: DC

## 2017-08-13 MED ORDER — TRAMADOL HCL 50 MG PO TABS: 50.0000 mg | ORAL_TABLET | Freq: Two times a day (BID) | ORAL | 0 refills | Status: DC | PRN

## 2017-08-13 NOTE — Assessment & Plan Note (Signed)
Anemia noted on CBC from July. Needs a colonoscopy. Obtain iron studies  - CBC - Iron and TIBC - Ferritin

## 2017-08-13 NOTE — Progress Notes (Signed)
   [name] Noralee Chars, MD Phone: [phone]  Reason For Visit: Follow up Appointment, Meet New PCP   # Rheumatoid arthritis - She has just recently had bilateral knee replacement surgery, her right knee was replaced in July - Finished up rehab in August  - patient seen rheumatology for rheumotiod arithitis - under control - She states that every once in a while she has severe pain and would like tramadol for this -She also takes tinazidine for muscle spasms as needed  # Panic Disorder  - As panic disorder has been on Celexa which helps significantly -Patient was placed on this last year by Dr. Leonides Schanz, doing well - Patient continues to have several stressors that affect her panic disorder Couple of funerals back to back. sister has lung cancer. Everyone is back in [place]. Patient mother pasted way in februray. Patient feels like she still needs this as she gets panic attacks and the celexa helps signfificantly  Health Maintenance - hx of breast and lung cancer  - Needs a mammogram and colonoscopy agrees to both of these  - Had a hx of hysterectomy for fibriods  - Had tetanus at her rheumatologist   Anemia  - No history per patient of anemia  - Denies any SOB, palpations, severe fatigue   Past Medical History Reviewed problem list.  Medications- reviewed and updated No additions to family history Social history- patient is anon smoker  Objective: BP 122/86   Pulse 93   Temp 98.8 F (37.1 C) (Oral)   Ht 5\' 5"  (1.651 m)   Wt 210 lb (95.3 kg)   SpO2 98%   BMI 34.95 kg/m  Gen: NAD, alert, cooperative with exam Cardio: regular rate and rhythm, S1S2 heard, no murmurs appreciated Pulm: clear to auscultation bilaterally, no wheezes, rhonchi or rales Extremities: warm, well perfused, No edema, cyanosis or clubbing;  Skin: dry, intact, no rashes or lesions    Assessment/Plan: See problem based a/p  Rheumatoid arthritis (HCC) Follows with  endocrinology Sometimes has muscle spasm and uses Tizadine regularly for this issues  Every once in while has an exacerbation of her rheumatoid arthritis, Will provide tramadol  Panic disorder Well controlled on Celexa  No changes for now   Anemia Anemia noted on CBC from July. Needs a colonoscopy. Obtain iron studies  - CBC - Iron and TIBC - Ferritin   Healthcare maintenance Provided information on colonoscopy  Placed order for mammogram  TDAP done at endocrinology  Has total hysterectomy for fibroids - does not need a pap smear

## 2017-08-13 NOTE — Assessment & Plan Note (Signed)
Provided information on colonoscopy  Placed order for mammogram  TDAP done at endocrinology  Has total hysterectomy for fibroids - does not need a pap smear

## 2017-08-13 NOTE — Patient Instructions (Addendum)
I want you to get your screening mammogram and colonscopy to check for cancer. I am going to check your bloodwork as your  Blood was little low back in July. I have sent in your refills. Follow up in 3 months.

## 2017-08-13 NOTE — Assessment & Plan Note (Signed)
Well controlled on Celexa  No changes for now

## 2017-08-13 NOTE — Assessment & Plan Note (Signed)
Follows with endocrinology Sometimes has muscle spasm and uses Tizadine regularly for this issues  Every once in while has an exacerbation of her rheumatoid arthritis, Will provide tramadol

## 2017-08-14 LAB — IRON AND TIBC
IRON: 49 ug/dL (ref 27–159)
Iron Saturation: 16 % (ref 15–55)
TIBC: 299 ug/dL (ref 250–450)
UIBC: 250 ug/dL (ref 131–425)

## 2017-08-14 LAB — CBC
HEMATOCRIT: 36.1 % (ref 34.0–46.6)
HEMOGLOBIN: 11.9 g/dL (ref 11.1–15.9)
MCH: 29.8 pg (ref 26.6–33.0)
MCHC: 33 g/dL (ref 31.5–35.7)
MCV: 91 fL (ref 79–97)
Platelets: 329 10*3/uL (ref 150–379)
RBC: 3.99 x10E6/uL (ref 3.77–5.28)
RDW: 13.6 % (ref 12.3–15.4)
WBC: 6.9 10*3/uL (ref 3.4–10.8)

## 2017-08-14 LAB — FERRITIN: FERRITIN: 76 ng/mL (ref 15–150)

## 2017-08-14 LAB — HIV ANTIBODY (ROUTINE TESTING W REFLEX): HIV Screen 4th Generation wRfx: NONREACTIVE

## 2017-08-17 ENCOUNTER — Telehealth: Payer: Self-pay | Admitting: *Deleted

## 2017-08-17 ENCOUNTER — Encounter: Payer: Self-pay | Admitting: Internal Medicine

## 2017-08-17 NOTE — Telephone Encounter (Signed)
Received fax request for controlled substance prior authorization from pharmacy for Tramadol 50mg . Form placed in MD's box for completion. Please return to [place] and I will submit via [place] tracks  [place], [place], CMA

## 2017-08-20 NOTE — Telephone Encounter (Signed)
Filled out form

## 2017-08-23 NOTE — Telephone Encounter (Signed)
Completed PA info in [facility] Tracks for Tramadol.  Status pending.  Will recheck status in 24 hours.  Confirmation number: [id number]   Fleeger, Maryjo [place], CMA

## 2017-08-24 NOTE — Telephone Encounter (Signed)
Prior approval for tramadol completed via [place] Tracks.  Med approved for 08/23/17 - 02/19/18  Prior approval # [id number].  [facility] informed.  Ashur Glatfelter, Maryjo [place], CMA

## 2017-09-13 ENCOUNTER — Other Ambulatory Visit: Payer: Self-pay | Admitting: Internal Medicine

## 2017-09-21 ENCOUNTER — Other Ambulatory Visit: Payer: Self-pay | Admitting: Internal Medicine

## 2017-09-22 NOTE — Telephone Encounter (Signed)
Yes, have placed medication upfront. Please let patient know. Thanks

## 2017-09-22 NOTE — Telephone Encounter (Signed)
Is this up front for patient to pick up?  Chamia Schmutz,CMA

## 2017-10-13 ENCOUNTER — Other Ambulatory Visit: Payer: Self-pay | Admitting: Internal Medicine

## 2017-10-13 NOTE — Telephone Encounter (Signed)
Pt needs refill for tizanidine sent to [facility] pharmacy. Please advise

## 2017-10-14 ENCOUNTER — Other Ambulatory Visit: Payer: Self-pay | Admitting: Internal Medicine

## 2017-10-14 MED ORDER — TIZANIDINE HCL 4 MG PO TABS
ORAL_TABLET | ORAL | 0 refills | Status: DC
Start: 2017-10-14 — End: 2017-11-17

## 2017-11-17 ENCOUNTER — Other Ambulatory Visit: Payer: Self-pay | Admitting: Internal Medicine

## 2017-12-14 ENCOUNTER — Other Ambulatory Visit: Payer: Self-pay | Admitting: Internal Medicine

## 2018-01-18 ENCOUNTER — Other Ambulatory Visit: Payer: Self-pay | Admitting: Internal Medicine

## 2018-02-21 ENCOUNTER — Other Ambulatory Visit: Payer: Self-pay | Admitting: Internal Medicine

## 2018-02-28 ENCOUNTER — Other Ambulatory Visit: Payer: Self-pay | Admitting: Internal Medicine

## 2018-02-28 DIAGNOSIS — F410 Panic disorder [episodic paroxysmal anxiety] without agoraphobia: Secondary | ICD-10-CM

## 2018-02-28 NOTE — Telephone Encounter (Signed)
Pt has changed pharmacies.    She [facility]  [address] She has an appt 03-03-18

## 2018-03-01 MED ORDER — RANITIDINE HCL 150 MG PO TABS: ORAL_TABLET | ORAL | 0 refills | Status: DC

## 2018-03-01 MED ORDER — DICLOFENAC SODIUM 1 % TD GEL: 2.0000 g | Freq: Four times a day (QID) | TRANSDERMAL | 1 refills | Status: DC | PRN

## 2018-03-01 MED ORDER — TRAMADOL HCL 50 MG PO TABS: ORAL_TABLET | ORAL | 0 refills | Status: DC

## 2018-03-01 MED ORDER — TIZANIDINE HCL 4 MG PO TABS: ORAL_TABLET | ORAL | 0 refills | Status: DC

## 2018-03-01 MED ORDER — OMEPRAZOLE 20 MG PO CPDR: 20.0000 mg | DELAYED_RELEASE_CAPSULE | Freq: Every day | ORAL | 0 refills | Status: DC

## 2018-03-01 MED ORDER — FUROSEMIDE 20 MG PO TABS: ORAL_TABLET | ORAL | 0 refills | Status: DC

## 2018-03-01 MED ORDER — CITALOPRAM HYDROBROMIDE 40 MG PO TABS: ORAL_TABLET | ORAL | 0 refills | Status: DC

## 2018-03-01 MED ORDER — ALBUTEROL SULFATE HFA 108 (90 BASE) MCG/ACT IN AERS: INHALATION_SPRAY | RESPIRATORY_TRACT | 1 refills | Status: DC

## 2018-03-01 NOTE — Telephone Encounter (Signed)
Sent medications that patient's PCP Dr. Cathlean Cower had recently refilled.   Dani Gobble, MD [facility], PGY-3

## 2018-03-07 ENCOUNTER — Ambulatory Visit: Payer: Medicaid Other | Admitting: Internal Medicine

## 2018-03-07 ENCOUNTER — Encounter: Payer: Self-pay | Admitting: Internal Medicine

## 2018-03-07 VITALS — BP 125/80 | HR 118 | Temp 99.5°F | Wt 227.2 lb

## 2018-03-07 DIAGNOSIS — F410 Panic disorder [episodic paroxysmal anxiety] without agoraphobia: Secondary | ICD-10-CM

## 2018-03-07 DIAGNOSIS — R059 Cough, unspecified: Secondary | ICD-10-CM

## 2018-03-07 DIAGNOSIS — R05 Cough: Secondary | ICD-10-CM | POA: Diagnosis present

## 2018-03-07 DIAGNOSIS — M069 Rheumatoid arthritis, unspecified: Secondary | ICD-10-CM | POA: Diagnosis not present

## 2018-03-07 MED ORDER — TIZANIDINE HCL 4 MG PO TABS: ORAL_TABLET | ORAL | 0 refills | Status: DC

## 2018-03-07 MED ORDER — CITALOPRAM HYDROBROMIDE 40 MG PO TABS: ORAL_TABLET | ORAL | 4 refills | Status: DC

## 2018-03-07 MED ORDER — TIZANIDINE HCL 4 MG PO TABS: ORAL_TABLET | ORAL | 3 refills | Status: DC

## 2018-03-07 MED ORDER — CETIRIZINE HCL 10 MG PO TABS: 10.0000 mg | ORAL_TABLET | Freq: Every day | ORAL | 5 refills | Status: DC

## 2018-03-07 NOTE — Assessment & Plan Note (Signed)
Cough for a week and half likely allergenic Zyrtec prescribed  Follow up as needed

## 2018-03-07 NOTE — Assessment & Plan Note (Signed)
Following with rheumatology Currently taking Plaquenil well controlled Will prescribe tramadol and Zanaflex for patient

## 2018-03-07 NOTE — Assessment & Plan Note (Signed)
Well controlled on Celexa  Refilled

## 2018-03-07 NOTE — Progress Notes (Signed)
   [name] Noralee Chars, MD Phone: [phone]  Reason For Visit: Follow up   # Cough  -Presenting with a cough.  She states that it has been bothering her for about a week and a half.  She denies any other significant symptoms.  There is no wheezing associate with this cough.  She there is no shortness of breath associated with this cough.  She does not have a history of cold.  She does not increase mucus associated with this.  She does have a history of pretty severe reflux.  However she is currently taking her ranitidine and Prilosec.  # Rheumatid Arthritis -She is treated for rheumatoid arthritis.  She takes Plaquenil.  She is followed by her  rheumatologist.  She receives tramadol and Zanaflex need for me for her pain.  # Panic Attack  -Patient states she has been taking Celexa.  Denies any panic attacks while on medication.  she has not had her Celexa refilled and does feel like she started to have a little bit of a panic attack last night. No SI, No depression, No issues with sleep, guilty   Past Medical History Reviewed problem list.  Medications- reviewed and updated No additions to family history Social history- patient is a non-smoker  Objective: BP 125/80 (BP Location: Right Arm, Patient Position: Sitting, Cuff Size: Normal)   Pulse (!) 118   Temp 99.5 F (37.5 C) (Oral)   Wt 227 lb 3.2 oz (103.1 kg)   SpO2 96%   BMI 37.81 kg/m  Gen: NAD, alert, cooperative with exam Cardio: regular rate and rhythm, S1S2 heard, no murmurs appreciated Pulm: clear to auscultation bilaterally, no wheezes, rhonchi or rales GI: soft, non-tender, non-distended, bowel sounds present, no hepatomegaly, no splenomegaly Extremities: warm, well perfused, No edema, cyanosis or clubbing;  MSK: Normal gait and station Skin: dry, intact, no rashes or lesions    Assessment/Plan: See problem based a/p  Rheumatoid arthritis (HCC) Following with rheumatology Currently  taking Plaquenil well controlled Will prescribe tramadol and Zanaflex for patient  COUGH Cough for a week and half likely allergenic Zyrtec prescribed  Follow up as needed   Panic disorder Well controlled on Celexa  Refilled

## 2018-03-07 NOTE — Patient Instructions (Addendum)
Please stop lasix. I have refilled your medications. For your cough I recommend zyrtec. Follow up in 6 months or if needed sooner.

## 2018-03-28 ENCOUNTER — Other Ambulatory Visit: Payer: Self-pay | Admitting: Internal Medicine

## 2018-03-28 DIAGNOSIS — R059 Cough, unspecified: Secondary | ICD-10-CM

## 2018-03-28 DIAGNOSIS — R05 Cough: Secondary | ICD-10-CM

## 2018-03-28 NOTE — Telephone Encounter (Signed)
Pt said she never received a Rx on her cough medicine and her cough just keeps getting worse. Please advise

## 2018-03-29 ENCOUNTER — Other Ambulatory Visit: Payer: Self-pay | Admitting: Internal Medicine

## 2018-03-30 ENCOUNTER — Other Ambulatory Visit: Payer: Self-pay

## 2018-03-30 MED ORDER — FLUTICASONE PROPIONATE 50 MCG/ACT NA SUSP: 2.0000 | Freq: Every day | NASAL | 6 refills | Status: DC

## 2018-03-30 MED ORDER — BENZONATATE 100 MG PO CAPS: 100.0000 mg | ORAL_CAPSULE | Freq: Two times a day (BID) | ORAL | 0 refills | Status: DC | PRN

## 2018-03-30 NOTE — Telephone Encounter (Signed)
Script was sent in for Zyrtec back in May. I will send in flonase and tessalon perles to help with cough today as well. If she does not feel her cough is getting much better I would advise her to follow up with me.

## 2018-03-30 NOTE — Telephone Encounter (Signed)
Pt informed. Kathy [name], CMA  

## 2018-03-31 ENCOUNTER — Telehealth: Payer: Self-pay

## 2018-03-31 MED ORDER — ALBUTEROL SULFATE HFA 108 (90 BASE) MCG/ACT IN AERS: INHALATION_SPRAY | RESPIRATORY_TRACT | 1 refills | Status: DC

## 2018-03-31 MED ORDER — TRAMADOL HCL 50 MG PO TABS: ORAL_TABLET | ORAL | 2 refills | Status: DC

## 2018-03-31 NOTE — Telephone Encounter (Signed)
Refilled

## 2018-03-31 NOTE — Telephone Encounter (Signed)
Pt calling to request refill of:Albuterol Inhaler Name of Medication(s):  Albuterol inhaler Last date of OV:  03/07/2018 Pharmacy:  [facility]  Will route refill request to Clinic RN.  Discussed with patient policy to call pharmacy for future refills.  Also, discussed refills may take up to 48 hours to approve or deny.  Sunday Spillers

## 2018-03-31 NOTE — Addendum Note (Signed)
Addended by: Noralee Chars Z on: 03/31/2018 04:23 PM   Modules accepted: Orders

## 2018-04-11 ENCOUNTER — Telehealth: Payer: Self-pay | Admitting: Internal Medicine

## 2018-04-11 MED ORDER — TIZANIDINE HCL 4 MG PO TABS: ORAL_TABLET | ORAL | 3 refills | Status: DC

## 2018-04-11 NOTE — Telephone Encounter (Signed)
I have sent in prescription.  She should be able to pick up tonight. Thanks Chelan Heringer

## 2018-04-11 NOTE — Telephone Encounter (Signed)
Needs a refill for her  Tizitanbizane (for spasms) and says she should be getting 30 pills/mo of 15 mg.  [facility] [place] [place] Marland Kitchen  Patient will call with info on which [facility] later, she justs wants to know if we can do it once she gets there.  She is leaving in the morning to go to [place].  Please call her at  [phone] w/any questions.

## 2018-04-17 ENCOUNTER — Other Ambulatory Visit: Payer: Self-pay | Admitting: Internal Medicine

## 2018-04-29 ENCOUNTER — Other Ambulatory Visit: Payer: Self-pay | Admitting: Internal Medicine

## 2018-05-02 ENCOUNTER — Other Ambulatory Visit: Payer: Self-pay | Admitting: Family Medicine

## 2018-05-02 DIAGNOSIS — M069 Rheumatoid arthritis, unspecified: Secondary | ICD-10-CM

## 2018-05-02 NOTE — Telephone Encounter (Signed)
Refilled Tramadol.   Patient is prescribed prednisone from her rheumatologist so she'll need to reach out to them for refill.   Please have patient schedule appointment in October for follow-up

## 2018-05-02 NOTE — Telephone Encounter (Signed)
Needs refill on tramadol and prednisone.  [facility] [address]

## 2018-05-03 MED ORDER — TRAMADOL HCL 50 MG PO TABS
ORAL_TABLET | ORAL | 2 refills | Status: DC
Start: 2018-05-03 — End: 2018-05-03

## 2018-05-03 MED ORDER — TRAMADOL HCL 50 MG PO TABS
ORAL_TABLET | ORAL | 2 refills | Status: DC
Start: 2018-05-03 — End: 2018-05-04

## 2018-05-03 NOTE — Addendum Note (Signed)
Addended byPerley Jain, Alexus Galka D on: 05/03/2018 11:21 AM   Modules accepted: Orders

## 2018-05-04 MED ORDER — TRAMADOL HCL 50 MG PO TABS: ORAL_TABLET | ORAL | 2 refills | Status: DC

## 2018-05-04 NOTE — Addendum Note (Signed)
Addended byPerley Jain, Jonnathan Birman D on: 05/04/2018 11:46 AM   Modules accepted: Orders

## 2018-05-07 ENCOUNTER — Other Ambulatory Visit: Payer: Self-pay | Admitting: Internal Medicine

## 2018-05-10 NOTE — Telephone Encounter (Signed)
Please let patient know that Dr. Cathlean Cower asked to discontinue this medication back in May. Thank you!

## 2018-05-11 NOTE — Telephone Encounter (Signed)
LMOVM for pt to call us back. Deseree Blount, CMA  

## 2018-05-12 ENCOUNTER — Other Ambulatory Visit: Payer: Self-pay

## 2018-05-12 DIAGNOSIS — D649 Anemia, unspecified: Secondary | ICD-10-CM

## 2018-05-15 ENCOUNTER — Other Ambulatory Visit: Payer: Self-pay | Admitting: Family Medicine

## 2018-05-15 MED ORDER — FUROSEMIDE 20 MG PO TABS: 20.0000 mg | ORAL_TABLET | Freq: Every day | ORAL | 0 refills | Status: DC

## 2018-05-15 MED ORDER — FERROUS SULFATE 325 (65 FE) MG PO TABS: ORAL_TABLET | ORAL | 1 refills | Status: DC

## 2018-05-15 MED ORDER — PREDNISONE 5 MG PO TABS: 5.0000 mg | ORAL_TABLET | Freq: Every day | ORAL | 0 refills | Status: DC

## 2018-05-16 ENCOUNTER — Other Ambulatory Visit: Payer: Self-pay | Admitting: *Deleted

## 2018-05-16 ENCOUNTER — Telehealth: Payer: Self-pay | Admitting: *Deleted

## 2018-05-16 DIAGNOSIS — D649 Anemia, unspecified: Secondary | ICD-10-CM

## 2018-05-16 MED ORDER — FERROUS SULFATE 325 (65 FE) MG PO TABS: ORAL_TABLET | ORAL | 1 refills | Status: DC

## 2018-05-16 MED ORDER — FUROSEMIDE 20 MG PO TABS: 20.0000 mg | ORAL_TABLET | Freq: Every day | ORAL | 0 refills | Status: DC

## 2018-05-16 MED ORDER — PREDNISONE 5 MG PO TABS: 5.0000 mg | ORAL_TABLET | Freq: Every day | ORAL | 0 refills | Status: DC

## 2018-05-16 NOTE — Telephone Encounter (Signed)
Pt called because pharmacy never received her scripts of prednisone, iron or lasix.   Upon review, lasix was sent to [facility] which she does not use anymore and the other 2 were printed instead of eRx'd.  [facility] patient. Tanza Pellot, Maryjo Rochester, CMA

## 2018-05-17 ENCOUNTER — Encounter (INDEPENDENT_AMBULATORY_CARE_PROVIDER_SITE_OTHER): Payer: Self-pay | Admitting: Orthopaedic Surgery

## 2018-05-17 ENCOUNTER — Ambulatory Visit (INDEPENDENT_AMBULATORY_CARE_PROVIDER_SITE_OTHER): Payer: Medicaid Other | Admitting: Orthopaedic Surgery

## 2018-05-17 ENCOUNTER — Ambulatory Visit (INDEPENDENT_AMBULATORY_CARE_PROVIDER_SITE_OTHER): Payer: Medicaid Other

## 2018-05-17 DIAGNOSIS — Z96652 Presence of left artificial knee joint: Secondary | ICD-10-CM | POA: Diagnosis not present

## 2018-05-17 DIAGNOSIS — M25562 Pain in left knee: Secondary | ICD-10-CM | POA: Diagnosis not present

## 2018-05-17 MED ORDER — TRAMADOL HCL 50 MG PO TABS: 50.0000 mg | ORAL_TABLET | Freq: Four times a day (QID) | ORAL | 0 refills | Status: DC | PRN

## 2018-05-17 MED ORDER — METHYLPREDNISOLONE 4 MG PO TABS: ORAL_TABLET | ORAL | 0 refills | Status: DC

## 2018-05-17 NOTE — Progress Notes (Signed)
[facility]   Office Visit Note   Patient: Kathy [name]           Date of Birth: 1966-05-13           MRN: [id number] Visit Date: 05/17/2018              Requested by: Orpah Cobb P, DO [address] [place], [place] [address] PCP: [name], DO   Assessment & Plan: Visit Diagnoses:  1. Acute pain of left knee   2. Status post total knee replacement, left     Plan: I gave her reassurance that the x-rays look fine with her total knee.  I will put her on 6 days of a steroid taper for inflammation and some tramadol for pain.  She will work on Dance movement psychotherapist exercises as well.  All questions concerns were answered and addressed.  Follow-up will be as needed at this point.  Follow-Up Instructions: Return if symptoms worsen or fail to improve.   Orders:  Orders Placed This Encounter  Procedures  . XR Knee 1-2 Views Left   Meds ordered this encounter  Medications  . methylPREDNISolone (MEDROL) 4 MG tablet    Sig: Medrol dose pack. Take as instructed    Dispense:  21 tablet    Refill:  0  . traMADol (ULTRAM) 50 MG tablet    Sig: Take 1-2 tablets (50-100 mg total) by mouth every 6 (six) hours as needed.    Dispense:  50 tablet    Refill:  0      Procedures: No procedures performed   Clinical Data: No additional findings.   Subjective: Chief Complaint  Patient presents with  . Left Knee - Follow-up  The patient is 12 months status post a left total knee arthroplasty.  She is been doing well but then just traveled to [place] and after he got back from a plane trip she had problems with bending her knee and straightening it.  She denies any specific injury but is been really sore for about 3 weeks now.  She denies any calf pain.  She denies any knee swelling.  She also has a right total knee arthroplasty that was placed 3 years ago and she is had no issues with that.  HPI  Review of Systems She currently denies any headache, chest pain, shortness  of breath, fever, chills, nausea, vomiting.  Objective: Vital Signs: There were no vitals taken for this visit.  Physical Exam She is alert and oriented x3 and in no acute distress she is not needing an assistive device to walk. Ortho Exam Examination of her left knee shows no effusion.  Extension is full and her flexion is only just past 90 degrees but that the same with her right knee as well.  Both calves are soft.  There is no redness around either knee incision.  Both knees feel grossly stable. Specialty Comments:  No specialty comments available.  Imaging: Xr Knee 1-2 Views Left  Result Date: 05/17/2018 2 views of the left knee show total knee arthroplasty with no complicating features.  There is no effusion.    PMFS History: Patient Active Problem List   Diagnosis Date Noted  . Healthcare maintenance 08/13/2017  . Status post total knee replacement, left 05/11/2017  . Panic disorder 07/23/2015  . Status post total right knee replacement 06/18/2015  . 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 Malnutrition of moderate degree (HCC) 09/20/2014  . Anemia 09/18/2014  . COUGH 02/04/2010  .  Rheumatoid arthritis (HCC) 09/15/2009  . DEGENERATIVE JOINT DISEASE, GENERALIZED 07/28/2007   Past Medical History:  Diagnosis Date  . Asthma    seasonal  . GERD (gastroesophageal reflux disease)   . Rheumatoid arteritis     Family History  Problem Relation Age of Onset  . Asthma Mother   . Diabetes Mother   . Hypertension Mother   . Heart disease Mother   . Asthma Father   . Cancer Father        lung  . Cancer Sister        breast at 48  . Cancer Brother        unknown    Past Surgical History:  Procedure Laterality Date  . ABDOMINAL HYSTERECTOMY    . TOTAL KNEE ARTHROPLASTY Right 06/18/2015   Procedure: RIGHT TOTAL KNEE ARTHROPLASTY;  Surgeon: Kathryne Hitch, MD;  Location: [facility] OR;  Service: Orthopedics;  Laterality: Right;  . TOTAL KNEE ARTHROPLASTY  Left 05/11/2017   Procedure: LEFT TOTAL KNEE ARTHROPLASTY;  Surgeon: Kathryne Hitch, MD;  Location: [facility] OR;  Service: Orthopedics;  Laterality: Left;   Social History   Occupational History  . Not on file  Tobacco Use  . Smoking status: Never Smoker  . Smokeless tobacco: Never Used  Substance and Sexual Activity  . Alcohol use: No  . Drug use: No  . Sexual activity: Not on file

## 2018-05-19 MED ORDER — TIZANIDINE HCL 4 MG PO TABS: ORAL_TABLET | ORAL | 3 refills | Status: DC

## 2018-05-23 DIAGNOSIS — M0609 Rheumatoid arthritis without rheumatoid factor, multiple sites: Secondary | ICD-10-CM | POA: Diagnosis not present

## 2018-05-23 DIAGNOSIS — Z79899 Other long term (current) drug therapy: Secondary | ICD-10-CM | POA: Diagnosis not present

## 2018-06-03 ENCOUNTER — Other Ambulatory Visit (INDEPENDENT_AMBULATORY_CARE_PROVIDER_SITE_OTHER): Payer: Self-pay | Admitting: Orthopaedic Surgery

## 2018-06-06 NOTE — Telephone Encounter (Signed)
Please advise 

## 2018-06-11 ENCOUNTER — Other Ambulatory Visit: Payer: Self-pay | Admitting: Family Medicine

## 2018-06-15 NOTE — Telephone Encounter (Signed)
Before I refill this prescription for prednisone, I want to make sure I understand what I am treating.  It was not clear to me from chart review how long she has been on prednisone and what it is for.  My assumption is that this is long term steroid use that is treating her rheumatoid arthritis.  Please call to find how long she has taken prednisone 5 mg and what condition it is treating.  I am happy to refill this rx, once that is confirmed.

## 2018-06-17 ENCOUNTER — Other Ambulatory Visit: Payer: Self-pay | Admitting: Family Medicine

## 2018-06-17 ENCOUNTER — Other Ambulatory Visit: Payer: Self-pay | Admitting: Internal Medicine

## 2018-06-17 NOTE — Telephone Encounter (Signed)
Per the office visit note on 03/07/18, this was discontinued.  I will not refill this medication.  Nursing, please remove furosemide from pt's med list.  Mirian Mo, MD

## 2018-06-20 DIAGNOSIS — M0689 Other specified rheumatoid arthritis, multiple sites: Secondary | ICD-10-CM | POA: Diagnosis not present

## 2018-06-20 DIAGNOSIS — H524 Presbyopia: Secondary | ICD-10-CM | POA: Diagnosis not present

## 2018-06-20 DIAGNOSIS — Z79899 Other long term (current) drug therapy: Secondary | ICD-10-CM | POA: Diagnosis not present

## 2018-06-20 NOTE — Telephone Encounter (Signed)
One Rx sent without refill.  Please let patient know that she should be seen in the clinic.

## 2018-06-21 ENCOUNTER — Other Ambulatory Visit: Payer: Self-pay

## 2018-06-21 MED ORDER — FUROSEMIDE 20 MG PO TABS: 20.0000 mg | ORAL_TABLET | Freq: Every day | ORAL | 0 refills | Status: DC

## 2018-06-22 ENCOUNTER — Other Ambulatory Visit: Payer: Self-pay

## 2018-06-22 MED ORDER — OMEPRAZOLE 20 MG PO CPDR: 20.0000 mg | DELAYED_RELEASE_CAPSULE | Freq: Every day | ORAL | 1 refills | Status: DC

## 2018-06-22 NOTE — Telephone Encounter (Signed)
Sent Rx to pharmacy  

## 2018-06-23 NOTE — Telephone Encounter (Signed)
Spoke with patient and she is using this medication for her rheumatoid and is having a flare.  Medication was already approved when it was sent to [place] the last time.  Jazmin Hartsell,CMA

## 2018-07-01 ENCOUNTER — Other Ambulatory Visit: Payer: Self-pay

## 2018-07-01 ENCOUNTER — Ambulatory Visit: Payer: Medicaid Other | Admitting: Family Medicine

## 2018-07-01 DIAGNOSIS — H9313 Tinnitus, bilateral: Secondary | ICD-10-CM | POA: Diagnosis not present

## 2018-07-01 DIAGNOSIS — R05 Cough: Secondary | ICD-10-CM

## 2018-07-01 DIAGNOSIS — R059 Cough, unspecified: Secondary | ICD-10-CM

## 2018-07-01 MED ORDER — OMEPRAZOLE 20 MG PO CPDR: 20.0000 mg | DELAYED_RELEASE_CAPSULE | Freq: Every day | ORAL | 1 refills | Status: DC

## 2018-07-01 MED ORDER — METHYLPREDNISOLONE 4 MG PO TABS: ORAL_TABLET | ORAL | 0 refills | Status: DC

## 2018-07-01 MED ORDER — BENZONATATE 100 MG PO CAPS: 100.0000 mg | ORAL_CAPSULE | Freq: Two times a day (BID) | ORAL | 0 refills | Status: DC | PRN

## 2018-07-01 MED ORDER — FLUTICASONE PROPIONATE 50 MCG/ACT NA SUSP: 2.0000 | Freq: Every day | NASAL | 6 refills | Status: DC

## 2018-07-01 NOTE — Progress Notes (Signed)
     Subjective: Chief Complaint  Patient presents with  . Medication Refill  . ringing and pain in ear    x 7-8 days bilateral but mostly left     HPI: Kathy [name] is a 52 y.o. presenting to clinic today to discuss the following:  "Ear Ringing" Patient reports she woke up about 8 days ago (it was on her birthday) with ringing in her ears. She has never had this before. She denies any pain, hearing loss, dizziness, nausea, or vomiting. She has not been sick recently and denies any fever or chills. No head trauma or falls.  Health Maintenance: none     ROS noted in HPI.   Past Medical, Surgical, Social, and Family History Reviewed & Updated per EMR.   Pertinent Historical Findings include:   Social History   Tobacco Use  Smoking Status Never Smoker  Smokeless Tobacco Never Used    Objective: BP 120/80 (BP Location: Right Arm, Patient Position: Sitting, Cuff Size: Normal)   Pulse (!) 106   Temp 99 F (37.2 C) (Oral)   Ht 5\' 5"  (1.651 m)   Wt 205 lb 3.2 oz (93.1 kg)   SpO2 100%   BMI 34.15 kg/m  Vitals and nursing notes reviewed  Physical Exam Gen: Alert and Oriented x 3, NAD HEENT: Normocephalic, atraumatic, PERRLA, EOMI, TM visible with good light reflex on the left, total impaction of cerumen on the right TM no visible Ext: no clubbing, cyanosis, or edema Skin: warm, dry, intact, no rashes  No results found for this or any previous visit (from the past 72 hour(s)).  Assessment/Plan:  Tinnitus aurium, bilateral Etiology considered includes Meniere's disease, AOM, Labyrinthitis, versus idiopathic tinnitus. I did wash out her ears and removed cerumen but no improvement. Will have patient monitor symptoms for 2-3 days and call if no improvement. If no improvement schedule appointment with otolaryngology      PATIENT EDUCATION PROVIDED: See AVS    Diagnosis and plan along with any newly prescribed medication(s) were discussed in detail with this patient  today. The patient verbalized understanding and agreed with the plan. Patient advised if symptoms worsen return to clinic or ER.   Health Maintainance:   No orders of the defined types were placed in this encounter.   Meds ordered this encounter  Medications  . benzonatate (TESSALON) 100 MG capsule    Sig: Take 1 capsule (100 mg total) by mouth 2 (two) times daily as needed for cough.    Dispense:  20 capsule    Refill:  0  . fluticasone (FLONASE) 50 MCG/ACT nasal spray    Sig: Place 2 sprays into both nostrils daily.    Dispense:  16 g    Refill:  6  . omeprazole (PRILOSEC) 20 MG capsule    Sig: Take 1 capsule (20 mg total) by mouth daily.    Dispense:  90 capsule    Refill:  1  . methylPREDNISolone (MEDROL) 4 MG tablet    Sig: Medrol dose pack. Take as instructed    Dispense:  21 tablet    Refill:  0     [place], DO 07/01/2018, 11:56 AM PGY-2 [facility]

## 2018-07-01 NOTE — Assessment & Plan Note (Signed)
Etiology considered includes Meniere's disease, AOM, Labyrinthitis, versus idiopathic tinnitus. I did wash out her ears and removed cerumen but no improvement. Will have patient monitor symptoms for 2-3 days and call us if no improvement. If no improvement schedule appointment with otolaryngology

## 2018-07-01 NOTE — Patient Instructions (Signed)
It was great to meet you today! Thank you for letting me participate in your care!  Today, we discussed your ear ringing also known as Tinnitus. Please continue to monitor your symptoms for 2-3 days and if it does not improve or you get additional symptoms of pain, hearing loss, dizziness, or lack of balance please return to the clinic immediately and you may benefit from seeing an ear specialist.  Be well, Jules Schick, DO PGY-2, [facility]

## 2018-07-11 DIAGNOSIS — M0609 Rheumatoid arthritis without rheumatoid factor, multiple sites: Secondary | ICD-10-CM | POA: Diagnosis not present

## 2018-07-11 DIAGNOSIS — M2550 Pain in unspecified joint: Secondary | ICD-10-CM | POA: Diagnosis not present

## 2018-07-11 DIAGNOSIS — Z79899 Other long term (current) drug therapy: Secondary | ICD-10-CM | POA: Diagnosis not present

## 2018-07-18 ENCOUNTER — Other Ambulatory Visit: Payer: Self-pay | Admitting: Family Medicine

## 2018-07-19 NOTE — Telephone Encounter (Signed)
Pt informed and scheduled for an appt. Deseree Blount, CMA  

## 2018-07-19 NOTE — Telephone Encounter (Signed)
This was discontinued in May 2019. Please have patient make an appointment at earliest convenience. Will not refill at this time.

## 2018-07-29 ENCOUNTER — Other Ambulatory Visit: Payer: Self-pay | Admitting: Family Medicine

## 2018-07-30 NOTE — Progress Notes (Signed)
Subjective:   Patient ID: Kathy [name]    DOB: 10-Jun-1966, 52 y.o. female   MRN: [id number]  Kathy [name] is a 52 y.o. female with a history of RA, GERD, left total knee arthroplasty 2018, right total knee arthroplasty 2016, panic attacks,  here for med refill.   Concerns for today: none  1. Rheumatoid Arthritis: Seen by rheumatologist Dr. Gerilyn Nestle at [facility]  Current treatments include MTX 5 tabs weekly, Plaquenil 200 mg a day, Prednisone 5 mg a day, Xeljanz 11 mg a day, and Tramadol 50 mg 3 times a day as needed. These medications are filled by her rheumatologist. She receives Tramadol and Tizanidine from me for pain. Finds these to control her pain well.  2. Panic Disorder  - Has history of panic disorder. Has been on Celexa by Dr. Lorenso Courier, helps significantly. She reports rarely having any panic attacks.  3. Health Maintenance - Family history of breast cancer (sister) and lung cancer  - Needs colonoscopy / fobt  - No pap smear due to hx of hysterectomy  - Mammogram due - Due for TDAP and flu   [place], medications and smoking status reviewed.  Objective:   BP 123/80 (BP Location: Right Arm, Patient Position: Sitting, Cuff Size: Normal)   Pulse 89   Temp 99.1 F (37.3 C) (Oral)   Wt 207 lb 3.2 oz (94 kg)   SpO2 98%   BMI 34.48 kg/m  Vitals and nursing note reviewed.  General: well nourished, well developed, in no acute distress with non-toxic appearance HEENT: normocephalic, atraumatic, moist mucous membranes Neck: supple, non-tender without lymphadenopathy CV: regular rate and rhythm without murmurs, rubs, or gallops, no lower extremity edema Lungs: clear to auscultation bilaterally with normal work of breathing Abdomen: soft, non-tender, non-distended, normoactive bowel sounds Skin: warm, dry, no rashes or lesions Extremities: warm and well perfused, normal tone MSK: ROM grossly intact Neuro: Alert and oriented, speech normal  Assessment & Plan:    Rheumatoid arthritis ([place])  Followed and treated by Dr. Gerilyn Nestle  Well controlled with current medications.  Receives all refills for RA from rheumatologist except Tramadol and Tizanidine  Will refill Tizanidine  Panic disorder  Well controlled on Celexa, has seen significant improvement  Will continue current treatment  Healthcare maintenance  Flu shot and TDAP given today  Mammogram referral sent  FOBT kit provided   No Mammogram as pt has history of hysterectomy  BP normotensive  No blood work as patient just got CBC and CMP at rheumatologist in September 2019. All WNL. (Available in Care Everywhere)  Orders Placed This Encounter  Procedures  . Fecal occult blood, imunochemical([facility])  . MM Digital Screening    Standing Status:   Future    Standing Expiration Date:   10/05/2019    Order Specific Question:   Reason for Exam (SYMPTOM  OR DIAGNOSIS REQUIRED)    Answer:   Screening, Family history of breast cancer    Order Specific Question:   Is the patient pregnant?    Answer:   No    Order Specific Question:   Preferred imaging location?    Answer:   [facility]  . Flu Vaccine QUAD 36+ mos IM  . Tdap vaccine greater than or equal to 7yo IM   Meds ordered this encounter  Medications  . tiZANidine (ZANAFLEX) 4 MG tablet    Sig: TAKE ONE TABLET BY MOUTH EVERY EIGHT HOURS AS NEEDED FOR MUSCLE SPASMS    Dispense:  48  tablet    Refill:  [place], DO PGY-1, [facility] 08/04/2018 2:42 PM

## 2018-08-04 ENCOUNTER — Encounter: Payer: Self-pay | Admitting: Family Medicine

## 2018-08-04 ENCOUNTER — Ambulatory Visit: Payer: Medicaid Other | Admitting: Family Medicine

## 2018-08-04 VITALS — BP 123/80 | HR 89 | Temp 99.1°F | Wt 207.2 lb

## 2018-08-04 DIAGNOSIS — F410 Panic disorder [episodic paroxysmal anxiety] without agoraphobia: Secondary | ICD-10-CM | POA: Diagnosis not present

## 2018-08-04 DIAGNOSIS — M069 Rheumatoid arthritis, unspecified: Secondary | ICD-10-CM

## 2018-08-04 DIAGNOSIS — Z1211 Encounter for screening for malignant neoplasm of colon: Secondary | ICD-10-CM

## 2018-08-04 DIAGNOSIS — Z0000 Encounter for general adult medical examination without abnormal findings: Secondary | ICD-10-CM | POA: Diagnosis not present

## 2018-08-04 DIAGNOSIS — Z1231 Encounter for screening mammogram for malignant neoplasm of breast: Secondary | ICD-10-CM

## 2018-08-04 DIAGNOSIS — Z23 Encounter for immunization: Secondary | ICD-10-CM | POA: Diagnosis not present

## 2018-08-04 MED ORDER — TIZANIDINE HCL 4 MG PO TABS: ORAL_TABLET | ORAL | 3 refills | Status: DC

## 2018-08-04 NOTE — Assessment & Plan Note (Addendum)
   Followed and treated by Dr. Sharmon Revere  Well controlled with current medications.  Receives all refills for RA from rheumatologist except Tramadol and Tizanidine  Will refill Tizanidine

## 2018-08-04 NOTE — Assessment & Plan Note (Addendum)
   Well controlled on Celexa, has seen significant improvement  Will continue current treatment

## 2018-08-04 NOTE — Assessment & Plan Note (Addendum)
   Flu shot and TDAP given today  Mammogram referral sent  FOBT kit provided   No Pap-smear as pt has history of hysterectomy  BP normotensive  No blood work as patient just got CBC and CMP at rheumatologist in September 2019. All WNL. (Available in [place])

## 2018-08-04 NOTE — Patient Instructions (Signed)
Thank you for coming to see me today. It was a pleasure to meet you. Today we talked about:   Your rheumatoid arthritis treatment and pain management. It appears you are well controlled. I will call in your prescription for Tizanidine.  You are also well controlled on your Celexa for your panic attacks so we will not make any changes. Your vitals looked great today!   You received your flu shot and TDAP today. I have sent in a referral for your mammogram. Please call to make an appointment for when you get back into town. I have provided you with the stool cards to complete the Colon Cancer screening.  Please follow-up with me (Dr. Mauri Reading) in a year for your physical, sooner if you need to.  If you have any questions or concerns, please do not hesitate to call the office at [phone].  Take Care,   Dr. Orpah Cobb, DO Resident Physician [facility] [phone]

## 2018-09-26 ENCOUNTER — Telehealth (INDEPENDENT_AMBULATORY_CARE_PROVIDER_SITE_OTHER): Payer: Self-pay | Admitting: Orthopaedic Surgery

## 2018-09-26 MED ORDER — TRAMADOL HCL 50 MG PO TABS: 50.0000 mg | ORAL_TABLET | Freq: Four times a day (QID) | ORAL | 0 refills | Status: DC | PRN

## 2018-09-26 NOTE — Telephone Encounter (Signed)
Please advise 

## 2018-09-26 NOTE — Telephone Encounter (Signed)
Let her know that I can send in one more prescription for tramadol but I will not keep refilling this type of medication because it still considered a low-dose narcotic.

## 2018-09-26 NOTE — Telephone Encounter (Signed)
Patient called to inquire about her medication refill that her pharmacy sent in for her on her Tramadol.  CB#[phone].  Thank you

## 2018-09-26 NOTE — Telephone Encounter (Signed)
Patient aware of the below message  

## 2018-10-03 ENCOUNTER — Other Ambulatory Visit: Payer: Self-pay

## 2018-10-03 MED ORDER — OMEPRAZOLE 20 MG PO CPDR: 20.0000 mg | DELAYED_RELEASE_CAPSULE | Freq: Every day | ORAL | 1 refills | Status: DC

## 2018-10-11 ENCOUNTER — Other Ambulatory Visit: Payer: Self-pay

## 2018-10-13 NOTE — Telephone Encounter (Signed)
It looks like a refill has already been requested by another provider 2 weeks ago. Please make sure patient is aware of this.

## 2018-10-14 NOTE — Telephone Encounter (Signed)
Have attempted to call patient to discuss prescription without any luck. Rings twice before going to voicemail. Will not refill at this time as she has a refill already at the pharmacy from another provider.

## 2018-10-17 ENCOUNTER — Other Ambulatory Visit: Payer: Self-pay | Admitting: *Deleted

## 2018-10-17 DIAGNOSIS — F410 Panic disorder [episodic paroxysmal anxiety] without agoraphobia: Secondary | ICD-10-CM

## 2018-10-17 MED ORDER — CITALOPRAM HYDROBROMIDE 40 MG PO TABS: ORAL_TABLET | ORAL | 1 refills | Status: DC

## 2018-10-31 DIAGNOSIS — M2550 Pain in unspecified joint: Secondary | ICD-10-CM | POA: Diagnosis not present

## 2018-10-31 DIAGNOSIS — M0609 Rheumatoid arthritis without rheumatoid factor, multiple sites: Secondary | ICD-10-CM | POA: Diagnosis not present

## 2018-10-31 DIAGNOSIS — Z79899 Other long term (current) drug therapy: Secondary | ICD-10-CM | POA: Diagnosis not present

## 2018-11-05 ENCOUNTER — Other Ambulatory Visit: Payer: Self-pay | Admitting: Family Medicine

## 2018-11-05 DIAGNOSIS — D649 Anemia, unspecified: Secondary | ICD-10-CM

## 2019-01-19 MED FILL — FUROSEMIDE 20 MG TABS: 20 | 90 days supply | Qty: 90 | Fill #0

## 2019-01-19 MED FILL — CITALOPRAM HBR 40 MG TABLET: 40 | 90 days supply | Qty: 90 | Fill #0

## 2019-01-19 MED FILL — HYDROXYCHLOROQUINE 200 MG: 200 | 90 days supply | Qty: 90 | Fill #0

## 2019-01-19 MED FILL — traMADol HCL 50 MG TABS: 50 | 7 days supply | Qty: 21 | Fill #0

## 2019-01-19 MED FILL — XELJANZ XR 11 MG TB24: 11 | 30 days supply | Qty: 30 | Fill #0

## 2019-01-19 MED FILL — predniSONE 5 MG TABS: 5 | 30 days supply | Qty: 30 | Fill #0

## 2019-01-19 MED FILL — OMEPRAZOLE 20 MG CPDR: 20 | 90 days supply | Qty: 90 | Fill #0

## 2019-01-24 MED FILL — traMADol HCL 50 MG TABS: 50 | 7 days supply | Qty: 21 | Fill #0

## 2019-02-07 MED FILL — traMADol HCL 50 MG TABS: 50 | 7 days supply | Qty: 21 | Fill #1

## 2019-02-13 MED FILL — traMADol HCL 50 MG TABS: 50 | 7 days supply | Qty: 21 | Fill #2

## 2019-02-21 MED FILL — traMADol HCL 50 MG TABS: 50 | 7 days supply | Qty: 21 | Fill #3

## 2019-02-21 MED FILL — VENTOLIN HFA 90 MCG INHALER: 108 (90 BAS | 25 days supply | Qty: 18 | Fill #0

## 2019-02-21 MED FILL — predniSONE 5 MG TABS: 5 | 30 days supply | Qty: 30 | Fill #1

## 2019-03-06 DIAGNOSIS — Z79899 Other long term (current) drug therapy: Secondary | ICD-10-CM | POA: Diagnosis not present

## 2019-03-06 DIAGNOSIS — M0609 Rheumatoid arthritis without rheumatoid factor, multiple sites: Secondary | ICD-10-CM | POA: Diagnosis not present

## 2019-03-13 ENCOUNTER — Other Ambulatory Visit: Payer: Self-pay

## 2019-03-13 MED ORDER — ALBUTEROL SULFATE HFA 108 (90 BASE) MCG/ACT IN AERS: INHALATION_SPRAY | RESPIRATORY_TRACT | 1 refills | Status: DC

## 2019-03-13 MED FILL — ALBUTEROL SULFATE HFA 108 (: 108 (90 BAS | 25 days supply | Qty: 9 | Fill #0

## 2019-03-23 MED FILL — predniSONE 5 MG TABS: 5 | 90 days supply | Qty: 90 | Fill #0

## 2019-04-05 DIAGNOSIS — M0609 Rheumatoid arthritis without rheumatoid factor, multiple sites: Secondary | ICD-10-CM | POA: Diagnosis not present

## 2019-04-05 DIAGNOSIS — Z79899 Other long term (current) drug therapy: Secondary | ICD-10-CM | POA: Diagnosis not present

## 2019-04-13 ENCOUNTER — Other Ambulatory Visit (INDEPENDENT_AMBULATORY_CARE_PROVIDER_SITE_OTHER): Payer: Self-pay | Admitting: Orthopaedic Surgery

## 2019-04-13 NOTE — Telephone Encounter (Signed)
Please advise 

## 2019-04-17 ENCOUNTER — Other Ambulatory Visit (INDEPENDENT_AMBULATORY_CARE_PROVIDER_SITE_OTHER): Payer: Self-pay | Admitting: Orthopaedic Surgery

## 2019-04-17 NOTE — Telephone Encounter (Signed)
Please advise 

## 2019-04-20 ENCOUNTER — Other Ambulatory Visit: Payer: Self-pay | Admitting: Family Medicine

## 2019-04-20 MED FILL — XELJANZ XR 11 MG TB24: 11 | 90 days supply | Qty: 90 | Fill #0

## 2019-04-20 MED FILL — ALBUTEROL SULFATE HFA 108 (: 108 (90 BAS | 25 days supply | Qty: 9 | Fill #1

## 2019-04-21 MED FILL — FUROSEMIDE 20 MG TABS: 20 | 90 days supply | Qty: 90 | Fill #0

## 2019-04-21 MED FILL — OMEPRAZOLE 20 MG CPDR: 20 | 90 days supply | Qty: 90 | Fill #0

## 2019-04-24 ENCOUNTER — Other Ambulatory Visit: Payer: Self-pay | Admitting: Family Medicine

## 2019-04-24 DIAGNOSIS — F410 Panic disorder [episodic paroxysmal anxiety] without agoraphobia: Secondary | ICD-10-CM

## 2019-04-24 MED FILL — traMADol HCL 50 MG TABS: 50 | 30 days supply | Qty: 90 | Fill #4

## 2019-04-25 MED FILL — CITALOPRAM HBR 40 MG TABLET: 40 | 90 days supply | Qty: 90 | Fill #0

## 2019-05-27 ENCOUNTER — Other Ambulatory Visit: Payer: Self-pay | Admitting: Family Medicine

## 2019-05-27 DIAGNOSIS — D649 Anemia, unspecified: Secondary | ICD-10-CM

## 2019-06-15 ENCOUNTER — Ambulatory Visit (HOSPITAL_COMMUNITY)
Admission: RE | Admit: 2019-06-15 | Discharge: 2019-06-15 | Disposition: A | Discharge status: Home / Self Care | Payer: Medicaid Other | Source: Ambulatory Visit | Attending: Family Medicine | Admitting: Family Medicine

## 2019-06-15 ENCOUNTER — Encounter: Payer: Self-pay | Admitting: Family Medicine

## 2019-06-15 ENCOUNTER — Other Ambulatory Visit: Payer: Self-pay

## 2019-06-15 ENCOUNTER — Ambulatory Visit: Payer: Medicaid Other | Admitting: Family Medicine

## 2019-06-15 VITALS — BP 138/88 | HR 110 | Temp 99.8°F | Wt 220.0 lb

## 2019-06-15 DIAGNOSIS — I493 Ventricular premature depolarization: Secondary | ICD-10-CM | POA: Insufficient documentation

## 2019-06-15 DIAGNOSIS — K219 Gastro-esophageal reflux disease without esophagitis: Secondary | ICD-10-CM | POA: Insufficient documentation

## 2019-06-15 DIAGNOSIS — M940 Chondrocostal junction syndrome [Tietze]: Secondary | ICD-10-CM

## 2019-06-15 DIAGNOSIS — J4520 Mild intermittent asthma, uncomplicated: Secondary | ICD-10-CM | POA: Diagnosis not present

## 2019-06-15 DIAGNOSIS — R000 Tachycardia, unspecified: Secondary | ICD-10-CM | POA: Insufficient documentation

## 2019-06-15 DIAGNOSIS — D649 Anemia, unspecified: Secondary | ICD-10-CM | POA: Diagnosis not present

## 2019-06-15 DIAGNOSIS — R079 Chest pain, unspecified: Secondary | ICD-10-CM | POA: Insufficient documentation

## 2019-06-15 DIAGNOSIS — M069 Rheumatoid arthritis, unspecified: Secondary | ICD-10-CM

## 2019-06-15 DIAGNOSIS — J45909 Unspecified asthma, uncomplicated: Secondary | ICD-10-CM | POA: Insufficient documentation

## 2019-06-15 MED ORDER — OMEPRAZOLE 20 MG PO CPDR: 20.0000 mg | DELAYED_RELEASE_CAPSULE | Freq: Every day | ORAL | 2 refills | Status: DC

## 2019-06-15 MED ORDER — FERROUS SULFATE 325 (65 FE) MG PO TABS: 325.0000 mg | ORAL_TABLET | Freq: Every day | ORAL | 1 refills | Status: DC

## 2019-06-15 MED ORDER — TIZANIDINE HCL 4 MG PO TABS: ORAL_TABLET | ORAL | 3 refills | Status: DC

## 2019-06-15 MED ORDER — ALBUTEROL SULFATE HFA 108 (90 BASE) MCG/ACT IN AERS: INHALATION_SPRAY | RESPIRATORY_TRACT | 1 refills | Status: DC

## 2019-06-15 MED ORDER — FAMOTIDINE 10 MG PO TABS: 10.0000 mg | ORAL_TABLET | Freq: Two times a day (BID) | ORAL | 11 refills | Status: DC

## 2019-06-15 MED FILL — tiZANidine HCL 4 MG TABS: 4 | 10 days supply | Qty: 30 | Fill #0

## 2019-06-15 MED FILL — ALBUTEROL SULFATE HFA 108 (: 108 (90 BAS | 25 days supply | Qty: 9 | Fill #0

## 2019-06-15 NOTE — Assessment & Plan Note (Addendum)
Acute chest discomfort most consistent with costochondritis given report of recent heavy lifting, reproducable pain on exam, and improvement with tylenol. EKG without signs of ischemia, vitals WNL, and no red flag symptoms makes cardiac etiology less likely. Recent lab work from June 2020 including CBC, CMP all WNL. GERD likely contributing given report of belching and worse when supine.  Patient has allergy to Aspirin and NSAIDs.  - recommend continue tylenol extra strength as needed for pain - OTC Voltaren gel, Selonpas patches, Icy-hot patches with lidocaine  - warm compresses or ice as needed - activity as tolerated - RTC in 4-6 weeks if no improvement, sooner if worsening - will discontinue omeprazole and start H2 blocker as patient has been on this before - start famotidine 10mg  BID. Can consider restarting PPI if inadequate treatment of symptoms with H2 blocker - return precautions discussed at length

## 2019-06-15 NOTE — Assessment & Plan Note (Signed)
Most recent Hgb 11.5, at baseline for patient. Currently on daily iron supplement. Asymptomatic. - continue current treatment

## 2019-06-15 NOTE — Assessment & Plan Note (Signed)
Very seldom use of albuterol. Requests refill of albuterol inhaler today. - Albuterol inhaler refilled

## 2019-06-15 NOTE — Assessment & Plan Note (Signed)
Recently ran out of PPI. Endorses symptoms of reflux.  - will hold on PPI and start H2 Blocker as patient as history of adequate treatment with this in the past - start famotidine 10mg  BID

## 2019-06-15 NOTE — Assessment & Plan Note (Signed)
Followed and treated by Dr. Nelia Shi. Next follow up visit in Sept. 2020. Well controlled with current medications.  Recently discontinued Tramadol per patient. Receives all refills for RA from rheumatologist except for Tizanidine. Notes improvement in symptoms with Tizanidine - Refill for Tizanidine provided today

## 2019-06-15 NOTE — Progress Notes (Signed)
Subjective:   Patient ID: MAGHEN GROUP    DOB: August 26, 1966, 53 y.o. female   MRN: [id number]  Kathy [name] is a 53 y.o. female with a history of rheumatoid arthritis, DJD, osteoarthritis, panic disorder, bilateral total knee replacements here for "chest pain".   "Chest Pain" Left sided chest pain that comes and goes since Sunday. She notes it occurs primarily right before bed when laying down. She describes the pain as a "sharp pinch". No abnormal tastes in her mouth. She does endorse burping and belching. She does have history of reflux and recently ran out of her medicine. Denies any symptoms with meals. Notes the "sharp pain occurs when she lies on her left side". Denies any chest pain with ambulation. Denies any shortness of breath. Denies any radiation of pain or numbness or tingling. Denies any current chest pain. Denies any leg swelling, vision changes, headaches. She does note heavy lifting over the weekend, lifting up heavy tubs as she was putting away clothes. Patient denies any heart disease or ever seeing a cardiologist. Reports mom had "heart disease" but is unsure what kind. Patient has tried extra strength tylenol which she notes has helped with her symptoms.   Review of Systems:  Per HPI.   [place], medications and smoking status reviewed.  Objective:   BP 138/88   Pulse (!) 110   Temp 99.8 F (37.7 C) (Oral)   Wt 220 lb (99.8 kg)   SpO2 93%   BMI 36.61 kg/m  Vitals and nursing note reviewed.  General: well nourished, well developed, in no acute distress with non-toxic appearance, sitting comfortably in exam chair CV: regular rate and rhythm without murmurs, rubs, or gallops, no lower extremity edema Lungs: clear to auscultation bilaterally with normal work of breathing Abdomen: soft, non-tender, non-distended, normoactive bowel sounds Skin: warm, dry Extremities: warm and well perfused MSK: chest wall tender to palpation along left side  Neuro: Alert and  oriented, speech normal  Assessment & Plan:   Asthma Very seldom use of albuterol. Requests refill of albuterol inhaler today. - Albuterol inhaler refilled  Anemia Most recent Hgb 11.5, at baseline for patient. Currently on daily iron supplement. Asymptomatic. - continue current treatment  Rheumatoid arthritis ([place]) Followed and treated by Dr. Nelia Shi. Next follow up visit in Sept. 2020. Well controlled with current medications.  Recently discontinued Tramadol per patient. Receives all refills for RA from rheumatologist except for Tizanidine. Notes improvement in symptoms with Tizanidine - Refill for Tizanidine provided today  Acute costochondritis Acute chest discomfort most consistent with costochondritis given report of recent heavy lifting, reproducable pain on exam, and improvement with tylenol. EKG without signs of ischemia, vitals WNL, and no red flag symptoms makes cardiac etiology less likely. Recent lab work from June 2020 including CBC, CMP all WNL. GERD likely contributing given report of belching and worse when supine.  Patient has allergy to Aspirin and NSAIDs.  - recommend continue tylenol extra strength as needed for pain - OTC Voltaren gel, Selonpas patches, Icy-hot patches with lidocaine  - warm compresses or ice as needed - activity as tolerated - RTC in 4-6 weeks if no improvement, sooner if worsening - will discontinue omeprazole and start H2 blocker as patient has been on this before - start famotidine 10mg  BID. Can consider restarting PPI if inadequate treatment of symptoms with H2 blocker - return precautions discussed at length  Gastroesophageal reflux disease without esophagitis Recently ran out of PPI. Endorses symptoms of reflux.  -  will hold on PPI and start H2 Blocker as patient as history of adequate treatment with this in the past - start famotidine 10mg  BID  Orders Placed This Encounter  Procedures  . EKG 12-Lead  . EKG 12-Lead    Ordered by an  unspecified provider    Meds ordered this encounter  Medications  . DISCONTD: omeprazole (PRILOSEC) 20 MG capsule    Sig: Take 1 capsule (20 mg total) by mouth daily.    Dispense:  90 capsule    Refill:  2  . albuterol (PROVENTIL HFA) 108 (90 Base) MCG/ACT inhaler    Sig: INHALE 1 TO 2 PUFFS INTO THE LUNGS EVERY6 HOURS AS NEEDED FOR WHEEZING OR SHORTNESS OF BREATH    Dispense:  6.7 g    Refill:  1  . ferrous sulfate 325 (65 FE) MG tablet    Sig: Take 1 tablet (325 mg total) by mouth daily.    Dispense:  90 tablet    Refill:  1  . tiZANidine (ZANAFLEX) 4 MG tablet    Sig: TAKE ONE TABLET BY MOUTH EVERY EIGHT HOURS AS NEEDED FOR MUSCLE SPASMS    Dispense:  30 tablet    Refill:  3  . famotidine (PEPCID AC) 10 MG tablet    Sig: Take 1 tablet (10 mg total) by mouth 2 (two) times daily.    Dispense:  60 tablet    Refill:  11    , DO PGY-2, [facility] 06/15/2019 7:35 PM

## 2019-06-15 NOTE — Patient Instructions (Signed)
Thank you for coming to see me today!  I think your chest discomfort may be from costochondritis which is inflammation of the cartilage of your ribs. These are the different medications you can take for this: Tylenol 500mg  1-2 tabs three times a day for pain. Capsaicin, aspercreme, or biofreeze topically up to four times a day may also help with pain. You can also try Voltaren gel, salonpas patches, or icy-hot with lidocaine Some supplements that may help for arthritis: Boswellia extract, curcumin, pycnogenol Heat or ice 15 minutes at a time 3-4 times a day as needed to help with pain. Follow up with me in 6 weeks if no improvement or as needed if you're doing well.    Costochondritis Costochondritis is swelling and irritation (inflammation) of the tissue (cartilage) that connects your ribs to your breastbone (sternum). This causes pain in the front of your chest. Usually, the pain:  Starts gradually.  Is in more than one rib. This condition usually goes away on its own over time. Follow these instructions at home:  Do not do anything that makes your pain worse.  If directed, put ice on the painful area: ? Put ice in a plastic bag. ? Place a towel between your skin and the bag. ? Leave the ice on for 20 minutes, 2-3 times a day.  If directed, put heat on the affected area as often as told by your doctor. Use the heat source that your doctor tells you to use, such as a moist heat pack or a heating pad. ? Place a towel between your skin and the heat source. ? Leave the heat on for 20-30 minutes. ? Take off the heat if your skin turns bright red. This is very important if you cannot feel pain, heat, or cold. You may have a greater risk of getting burned.  Take over-the-counter and prescription medicines only as told by your doctor.  Return to your normal activities as told by your doctor. Ask your doctor what activities are safe for you.  Keep all follow-up visits as told by your  doctor. This is important. Contact a doctor if:  You have chills or a fever.  Your pain does not go away or it gets worse.  You have a cough that does not go away. Get help right away if:  You are short of breath. This information is not intended to replace advice given to you by your health care provider. Make sure you discuss any questions you have with your health care provider. Document Released: 03/30/2008 Document Revised: 10/27/2017 Document Reviewed: 02/05/2016 Elsevier Patient Education  2020 [employer].

## 2019-07-18 MED FILL — FERROUS SULFATE 325 MG TAB: 325 (65 FE) | 90 days supply | Qty: 90 | Fill #0

## 2019-07-18 MED FILL — FAMOTIDINE 20 MG TABS: 20 | 30 days supply | Qty: 30 | Fill #0

## 2019-07-18 MED FILL — CITALOPRAM HBR 40 MG TABLET: 40 | 90 days supply | Qty: 90 | Fill #1

## 2019-07-18 MED FILL — tiZANidine HCL 4 MG TABS: 4 | 10 days supply | Qty: 30 | Fill #1

## 2019-07-18 MED FILL — FUROSEMIDE 20 MG TABS: 20 | 90 days supply | Qty: 90 | Fill #0

## 2019-07-18 MED FILL — traMADol HCL 50 MG TABS: 50 | 30 days supply | Qty: 90 | Fill #0

## 2019-07-18 MED FILL — OMEPRAZOLE 20 MG CAP: 20 | 90 days supply | Qty: 90 | Fill #1

## 2019-07-18 MED FILL — predniSONE 5 MG TABS: 5 | 90 days supply | Qty: 90 | Fill #0

## 2019-08-31 DIAGNOSIS — Z23 Encounter for immunization: Secondary | ICD-10-CM | POA: Diagnosis not present

## 2019-09-15 MED FILL — tiZANidine HCL 4 MG TABS: 4 | 10 days supply | Qty: 30 | Fill #2

## 2019-09-15 MED FILL — ALBUTEROL SULFATE HFA 108 (: 108 (90 BAS | 25 days supply | Qty: 9 | Fill #1

## 2019-09-15 MED FILL — FAMOTIDINE 20 MG TABS: 20 | 30 days supply | Qty: 30 | Fill #1

## 2019-10-06 MED FILL — tiZANidine HCL 4 MG TABS: 4 | 10 days supply | Qty: 30 | Fill #3

## 2019-10-06 MED FILL — traMADol HCL 50 MG TABS: 50 | 30 days supply | Qty: 90 | Fill #1

## 2019-10-24 MED FILL — FERROUS SULFATE 325 MG TAB: 325 (65 FE) | 90 days supply | Qty: 90 | Fill #1

## 2019-10-24 MED FILL — CITALOPRAM HBR 40 MG TABLET: 40 | 90 days supply | Qty: 90 | Fill #2

## 2019-10-24 MED FILL — FUROSEMIDE 20 MG TABS: 20 | 90 days supply | Qty: 90 | Fill #0

## 2019-10-24 MED FILL — OMEPRAZOLE 20 MG CAP: 20 | 90 days supply | Qty: 90 | Fill #2

## 2019-10-24 MED FILL — FAMOTIDINE 20 MG TABS: 20 | 30 days supply | Qty: 30 | Fill #2

## 2019-10-24 MED FILL — predniSONE 5 MG TABS: 5 | 90 days supply | Qty: 90 | Fill #1

## 2019-12-13 ENCOUNTER — Other Ambulatory Visit: Payer: Self-pay | Admitting: Family Medicine

## 2019-12-13 DIAGNOSIS — M069 Rheumatoid arthritis, unspecified: Secondary | ICD-10-CM

## 2019-12-13 MED FILL — FAMOTIDINE 20 MG TABS: 20 | 30 days supply | Qty: 30 | Fill #3

## 2019-12-13 MED FILL — tiZANidine HCL 4 MG TABS: 4 | 10 days supply | Qty: 30 | Fill #0

## 2019-12-13 MED FILL — traMADol HCL 50 MG TABS: 50 | 7 days supply | Qty: 21 | Fill #2

## 2020-01-19 ENCOUNTER — Other Ambulatory Visit: Payer: Self-pay | Admitting: Family Medicine

## 2020-01-19 DIAGNOSIS — D649 Anemia, unspecified: Secondary | ICD-10-CM

## 2020-01-19 DIAGNOSIS — F410 Panic disorder [episodic paroxysmal anxiety] without agoraphobia: Secondary | ICD-10-CM

## 2020-01-19 MED FILL — tiZANidine HCL 4 MG TABS: 4 | 10 days supply | Qty: 30 | Fill #1

## 2020-01-19 MED FILL — predniSONE 5 MG TABS: 5 | 90 days supply | Qty: 90 | Fill #2

## 2020-01-19 MED FILL — OMEPRAZOLE 20 MG CAP: 20 | 90 days supply | Qty: 90 | Fill #0

## 2020-01-19 MED FILL — FAMOTIDINE 20 MG TABLET: 20 | 30 days supply | Qty: 30 | Fill #4

## 2020-01-22 MED FILL — CITALOPRAM HBR 40 MG TABLET: 40 | 90 days supply | Qty: 90 | Fill #0

## 2020-01-22 MED FILL — FERROUS SULFATE 325 MG TAB: 325 (65 FE) | 100 days supply | Qty: 100 | Fill #0

## 2020-01-30 MED FILL — tiZANidine HCL 4 MG TABS: 4 | 10 days supply | Qty: 30 | Fill #2

## 2020-01-31 ENCOUNTER — Other Ambulatory Visit: Payer: Self-pay

## 2020-02-01 MED ORDER — FUROSEMIDE 20 MG PO TABS: 20.0000 mg | ORAL_TABLET | Freq: Every day | ORAL | 0 refills | Status: DC | PRN

## 2020-02-01 MED FILL — FUROSEMIDE 20 MG TABS: 20 | 30 days supply | Qty: 30 | Fill #0

## 2020-02-20 ENCOUNTER — Other Ambulatory Visit: Payer: Self-pay

## 2020-02-20 ENCOUNTER — Ambulatory Visit: Payer: Medicaid Other | Admitting: Family Medicine

## 2020-02-20 VITALS — BP 120/75 | HR 100 | Ht 65.0 in | Wt 207.0 lb

## 2020-02-20 DIAGNOSIS — D649 Anemia, unspecified: Secondary | ICD-10-CM | POA: Diagnosis not present

## 2020-02-20 DIAGNOSIS — Z1231 Encounter for screening mammogram for malignant neoplasm of breast: Secondary | ICD-10-CM

## 2020-02-20 DIAGNOSIS — E782 Mixed hyperlipidemia: Secondary | ICD-10-CM | POA: Diagnosis not present

## 2020-02-20 DIAGNOSIS — Z13220 Encounter for screening for lipoid disorders: Secondary | ICD-10-CM

## 2020-02-20 DIAGNOSIS — M069 Rheumatoid arthritis, unspecified: Secondary | ICD-10-CM | POA: Diagnosis not present

## 2020-02-20 DIAGNOSIS — Z1211 Encounter for screening for malignant neoplasm of colon: Secondary | ICD-10-CM | POA: Diagnosis not present

## 2020-02-20 MED ORDER — TRAMADOL HCL 50 MG PO TABS: 50.0000 mg | ORAL_TABLET | Freq: Two times a day (BID) | ORAL | 0 refills | Status: AC | PRN

## 2020-02-20 MED FILL — traMADol HCL 50 MG TABS: 50 | 11 days supply | Qty: 21 | Fill #0

## 2020-02-20 NOTE — Progress Notes (Signed)
Subjective:   Patient ID: Kathy [name]    DOB: 06/06/1966, 54 y.o. female   MRN: [id number]  Kathy [name] is a 54 y.o. female with a history of asthma, GERD, rheumatoid arthritis, degenerative joint disease, osteoarthritis, anemia, panic disorder here for checkup.  Acute Concerns: None  Rheumatoid Arthritis: Follows closely with Dr. Thermon Leyland.  Last appointment 02/2019.  Patient is required to have a follow-up appointment prior to refills.  She is aware of this.  She is well controlled with her current medications and receives all refills from her rheumatologist except for tizanidine.  She is currently in severe pain as she has been out of her Tramadol and has been hesitant to schedule appointments during COVID.   Health Maintenance: Due for mammogram and annual FOBT for colon cancer screening.  Due for Covid 19 vaccine  Review of Systems:  Per HPI.   Objective:   BP 120/75   Pulse 100   Ht 5\' 5"  (1.651 m)   Wt 207 lb (93.9 kg)   SpO2 98%   BMI 34.45 kg/m  Vitals and nursing note reviewed.  General: Pleasant older female, sitting comfortably in exam chair, well nourished, well developed, in no acute distress with non-toxic appearance CV: regular rate and rhythm without murmurs, rubs, or gallops, no lower extremity edema, 2+ radial and pedal pulses bilaterally Lungs: clear to auscultation bilaterally with normal work of breathing Abdomen: soft, non-tender, non-distended Skin: warm, dry Extremities: warm and well perfused, skeletal changes appreciated on hands bilaterally secondary to RA MSK: gait normal Neuro: Alert and oriented, speech normal  Assessment & Plan:   Rheumatoid arthritis ([facility]) Follows closely with rheumatologist.  She is due for a visit prior to being provided with refills.  Will obtain annual labs as it has been almost a year since they have been evaluated.  CBC and CMP are stable and within normal limits.  Patient is in acute pain likely secondary to  acute RA flare. She is instructed to take extra Prednisone when she has a flare. Will provide a 2-week supply of tramadol to allow her time to schedule appointment with her rheumatologist.  Patient is in understanding that this will not be a recurrent prescription.  Educated patient on importance of obtaining COVID-19 vaccine given her chronic immunocompromised state.  She voiced understanding and agreement to plan.  Hyperlipidemia Elevated LDL. No history of DM or HTN. The 10-year ASCVD risk score DC Kathy [name]., et al., 2013) is: 2%. Recommend life style modifications focusing on limiting saturated fats.   Anemia History of anemia. Hgb stable at 11.6 today.  Patient denies any signs of acute bleed.  Healthcare Maintenance:  Mammogram order placed. Annual FOBT provided.  Patient instructed to return card when completed. Provided assistance and showed patient how to schedule for COVID-19 vaccine.  Orders Placed This Encounter  Procedures  . Fecal occult blood, imunochemical  . MM Digital Screening    Standing Status:   Future    Standing Expiration Date:   04/21/2021    Order Specific Question:   Reason for Exam (SYMPTOM  OR DIAGNOSIS REQUIRED)    Answer:   screen for breast cancer    Order Specific Question:   Is the patient pregnant?    Answer:   No    Order Specific Question:   Preferred imaging location?    Answer:   [facility]  . Comprehensive metabolic panel  . CBC  . Lipid panel   Meds ordered this encounter  Medications  . traMADol (ULTRAM) 50 MG tablet    Sig: Take 1 tablet (50 mg total) by mouth every 12 (twelve) hours as needed for up to 14 days for severe pain.    Dispense:  21 tablet    Refill:  0   Mina Marble, DO PGY-2, [place] [facility] 02/22/2020 1:38 PM

## 2020-02-20 NOTE — Patient Instructions (Signed)
Thank you for coming to see me today. It was a pleasure to see you.   Please sign up for your COVID vaccine.   Phone Assistance: Please call [phone], Monday through Friday between 7 a.m. and 7 p.m.   [address]  [place]) [place] [employer] [address] [place], [place] [address] Hours: Thu: 1p-5p Type: Moderna (18+)   [address]  [facility] [place]) [place] [address] [place], [place] [address] Hours: Thu-Sat 10a-5p Type: Pfizer (16+)  We got labs today. I will call you with the results. Please schedule you mammogram at your earliest convenience. Please return the colon cancer screen at you earliest convenience.   Please follow-up with me in 6-12 months, sooner if needed.  If you have any questions or concerns, please do not hesitate to call the office at [phone].  Take Care,   Dr. Orpah Cobb, DO Resident Physician [facility] [phone]

## 2020-02-21 LAB — COMPREHENSIVE METABOLIC PANEL
ALT: 32 IU/L (ref 0–32)
AST: 33 IU/L (ref 0–40)
Albumin/Globulin Ratio: 1.1 — ABNORMAL LOW (ref 1.2–2.2)
Albumin: 4.4 g/dL (ref 3.8–4.9)
Alkaline Phosphatase: 118 IU/L — ABNORMAL HIGH (ref 39–117)
BUN/Creatinine Ratio: 21 (ref 9–23)
BUN: 14 mg/dL (ref 6–24)
Bilirubin Total: 0.4 mg/dL (ref 0.0–1.2)
CO2: 24 mmol/L (ref 20–29)
Calcium: 9.8 mg/dL (ref 8.7–10.2)
Chloride: 101 mmol/L (ref 96–106)
Creatinine, Ser: 0.66 mg/dL (ref 0.57–1.00)
GFR calc Af Amer: 117 mL/min/{1.73_m2} (ref >59)
GFR calc non Af Amer: 101 mL/min/{1.73_m2} (ref >59)
Globulin, Total: 4.1 g/dL (ref 1.5–4.5)
Glucose: 86 mg/dL (ref 65–99)
Potassium: 4.2 mmol/L (ref 3.5–5.2)
Sodium: 139 mmol/L (ref 134–144)
Total Protein: 8.5 g/dL (ref 6.0–8.5)

## 2020-02-21 LAB — CBC
Hematocrit: 34.6 % (ref 34.0–46.6)
Hemoglobin: 11.6 g/dL (ref 11.1–15.9)
MCH: 30.6 pg (ref 26.6–33.0)
MCHC: 33.5 g/dL (ref 31.5–35.7)
MCV: 91 fL (ref 79–97)
Platelets: 457 10*3/uL — ABNORMAL HIGH (ref 150–450)
RBC: 3.79 x10E6/uL (ref 3.77–5.28)
RDW: 12.6 % (ref 11.7–15.4)
WBC: 8.9 10*3/uL (ref 3.4–10.8)

## 2020-02-21 LAB — LIPID PANEL
Chol/HDL Ratio: 3.5 ratio (ref 0.0–4.4)
Cholesterol, Total: 186 mg/dL (ref 100–199)
HDL: 53 mg/dL (ref >39)
LDL Chol Calc (NIH): 118 mg/dL — ABNORMAL HIGH (ref 0–99)
Triglycerides: 83 mg/dL (ref 0–149)
VLDL Cholesterol Cal: 15 mg/dL (ref 5–40)

## 2020-02-22 ENCOUNTER — Encounter: Payer: Self-pay | Admitting: Family Medicine

## 2020-02-22 DIAGNOSIS — E785 Hyperlipidemia, unspecified: Secondary | ICD-10-CM | POA: Insufficient documentation

## 2020-02-22 NOTE — Assessment & Plan Note (Signed)
History of anemia. Hgb stable at 11.6 today.  Patient denies any signs of acute bleed.

## 2020-02-22 NOTE — Assessment & Plan Note (Addendum)
Elevated LDL. No history of DM or HTN. The 10-year ASCVD risk score Denman George DC Montez Hageman., et al., 2013) is: 2%. Recommend life style modifications focusing on limiting saturated fats.

## 2020-02-22 NOTE — Assessment & Plan Note (Addendum)
Follows closely with rheumatologist.  She is due for a visit prior to being provided with refills.  Will obtain annual labs as it has been almost a year since they have been evaluated.  CBC and CMP are stable and within normal limits.  Patient is in acute pain likely secondary to acute RA flare. She is instructed to take extra Prednisone when she has a flare. Will provide a 2-week supply of tramadol to allow her time to schedule appointment with her rheumatologist.  Patient is in understanding that this will not be a recurrent prescription.  Educated patient on importance of obtaining COVID-19 vaccine given her chronic immunocompromised state.  She voiced understanding and agreement to plan.

## 2020-02-29 ENCOUNTER — Ambulatory Visit: Payer: Medicaid Other | Attending: Internal Medicine

## 2020-02-29 DIAGNOSIS — Z23 Encounter for immunization: Secondary | ICD-10-CM

## 2020-02-29 NOTE — Progress Notes (Signed)
   Covid-19 Vaccination Clinic  Name:  Kathy [name]    MRN: [id number] DOB: 1966/01/29  02/29/2020  [name] was observed post Covid-19 immunization for 15 minutes without incident. She was provided with Vaccine Information Sheet and instruction to access the V-Safe system.   [name] was instructed to call 911 with any severe reactions post vaccine: Marland Kitchen Difficulty breathing  . Swelling of face and throat  . A fast heartbeat  . A bad rash all over body  . Dizziness and weakness   Immunizations Administered    Name Date Dose VIS Date Route   Pfizer COVID-19 Vaccine 02/29/2020 10:47 AM 0.3 mL 12/20/2018 Intramuscular   Manufacturer: [employer], [employer]   Lot: [id number]   NDC: [address]-9

## 2020-03-06 ENCOUNTER — Ambulatory Visit: Payer: Medicaid Other

## 2020-03-11 ENCOUNTER — Other Ambulatory Visit: Payer: Self-pay | Admitting: Family Medicine

## 2020-03-11 DIAGNOSIS — M069 Rheumatoid arthritis, unspecified: Secondary | ICD-10-CM

## 2020-03-11 MED FILL — tiZANidine HCL 4 MG TABS: 4 | 10 days supply | Qty: 30 | Fill #3

## 2020-03-11 NOTE — Telephone Encounter (Signed)
Please inform patient that she will need to contact her rheumatologist for further refills. This was a one time prescription to allow her time to schedule an appointment. I am sorry for the inconvenience.

## 2020-03-11 NOTE — Telephone Encounter (Signed)
Pt informed. Mayrin Schmuck, CMA  

## 2020-03-26 ENCOUNTER — Ambulatory Visit: Payer: Medicaid Other | Attending: Internal Medicine

## 2020-03-26 DIAGNOSIS — Z23 Encounter for immunization: Secondary | ICD-10-CM

## 2020-03-26 NOTE — Progress Notes (Signed)
   Covid-19 Vaccination Clinic  Name:  [name]    MRN: [id number] DOB: March 30, 1966  03/26/2020  [name] was observed post Covid-19 immunization for 15 minutes without incident. She was provided with Vaccine Information Sheet and instruction to access the V-Safe system.   [name] was instructed to call 911 with any severe reactions post vaccine: Marland Kitchen Difficulty breathing  . Swelling of face and throat  . A fast heartbeat  . A bad rash all over body  . Dizziness and weakness   Immunizations Administered    Name Date Dose VIS Date Route   Pfizer COVID-19 Vaccine 03/26/2020 10:41 AM 0.3 mL 12/20/2018 Intramuscular   Manufacturer: [employer], [employer]   Lot: [id number]   NDC: [address]-8

## 2020-04-29 ENCOUNTER — Other Ambulatory Visit: Payer: Self-pay | Admitting: Family Medicine

## 2020-04-29 DIAGNOSIS — M069 Rheumatoid arthritis, unspecified: Secondary | ICD-10-CM

## 2020-04-29 MED FILL — FERROUS SULFATE 325 MG TAB: 325 (65 FE) | 100 days supply | Qty: 100 | Fill #1

## 2020-04-29 MED FILL — FAMOTIDINE 20 MG TABLET: 20 | 30 days supply | Qty: 30 | Fill #5

## 2020-04-29 MED FILL — CITALOPRAM HBR 40 MG TABLET: 40 | 90 days supply | Qty: 90 | Fill #1

## 2020-04-29 MED FILL — OMEPRAZOLE 20 MG CAP: 20 | 90 days supply | Qty: 90 | Fill #1

## 2020-04-30 ENCOUNTER — Other Ambulatory Visit: Payer: Self-pay | Admitting: Family Medicine

## 2020-04-30 DIAGNOSIS — M069 Rheumatoid arthritis, unspecified: Secondary | ICD-10-CM

## 2020-04-30 MED FILL — predniSONE 5 MG TABS: 5 | 30 days supply | Qty: 30 | Fill #0

## 2020-04-30 MED FILL — FUROSEMIDE 20 MG TABS: 20 | 30 days supply | Qty: 30 | Fill #0

## 2020-05-01 MED FILL — tiZANidine HCL 4 MG TABS: 4 | 10 days supply | Qty: 30 | Fill #0

## 2020-05-07 ENCOUNTER — Ambulatory Visit: Payer: Medicaid Other

## 2020-05-13 ENCOUNTER — Telehealth: Payer: Self-pay

## 2020-05-13 DIAGNOSIS — M069 Rheumatoid arthritis, unspecified: Secondary | ICD-10-CM

## 2020-05-13 NOTE — Telephone Encounter (Signed)
Yes please have patient schedule a visit to be evaluated. Please advice her that she should schedule a follow up with her rheumatologist as it has been over a year since she has been seen and should be seen regularly for continued monitoring and refills. Thank you.

## 2020-05-13 NOTE — Telephone Encounter (Signed)
Patient calls nurse line regarding receiving refill on prednisone. Patient is also requesting an increase from 5 mg to 10 mg tablets as she is having increased pain in joint after taking a long trip, following a death in the family.   Advised patient that she may need to schedule appointment to discuss increase, however, patient wants to wait for further instructions.   If appropriate, please send to [facility] outpatient pharmacy.   To PCP  Veronda Prude, RN

## 2020-05-14 NOTE — Telephone Encounter (Signed)
Spoke with pt and informed her. But she said she quarantined herself because she has been out of town. Wants to know what to do about an appt. Synethia Endicott Bruna Potter, CMA

## 2020-05-15 MED ORDER — PREDNISONE 10 MG PO TABS: 10.0000 mg | ORAL_TABLET | Freq: Every day | ORAL | 0 refills | Status: DC

## 2020-05-15 MED FILL — predniSONE 10 MG TABS: 10 | 5 days supply | Qty: 5 | Fill #0

## 2020-05-15 NOTE — Telephone Encounter (Signed)
Spoke with patient. She notes she had a death in the family and had to travel to [place]. She notes that once she got back her knees, shoulders, and neck are now bothering her. She notes her knees are swollen but no redness or warmth. Denies and lower extremity/calf swelling. She normally doubles her Prednisone to 10mg  for 2-3 days and her symptoms resolve.   Since she returned from her trip she has been taking her Prednisone 5mg  daily.   Will prescribe 5 day course of Prednisone 10mg  QD.  Patient urged to follow up with rheumatologist at earliest convenience. She voiced understanding and agreement with plan. Return precautions include development of erythema, warmth, fevers/chills, asymmetric LE swelling.

## 2020-05-15 NOTE — Telephone Encounter (Signed)
Spoke with patient. See telephone note for details.

## 2020-05-22 MED FILL — FAMOTIDINE 20 MG TABLET: 20 | 30 days supply | Qty: 30 | Fill #6

## 2020-05-23 MED FILL — predniSONE 5 MG TABS: 5 | 30 days supply | Qty: 30 | Fill #1

## 2020-05-23 MED FILL — tiZANidine HCL 4 MG TABS: 4 | 10 days supply | Qty: 30 | Fill #1

## 2020-05-28 ENCOUNTER — Telehealth: Payer: Self-pay | Admitting: Family Medicine

## 2020-05-28 NOTE — Telephone Encounter (Signed)
Patient is calling and would like to know if Dr. Mauri Reading can place a new rheumatologist referral. She currently goes to an office out of town and now does not have the transportation. She is requesting to have a referral sent to an office in [place].    The best call back number if there are any questions is [phone]

## 2020-05-29 ENCOUNTER — Other Ambulatory Visit: Payer: Self-pay | Admitting: Family Medicine

## 2020-05-29 DIAGNOSIS — M069 Rheumatoid arthritis, unspecified: Secondary | ICD-10-CM

## 2020-05-29 NOTE — Telephone Encounter (Signed)
Please inform patient that referral has been placed. She should allow 10-14 days for the referral to process. Please let me know if she doesn't hear from anyone within 2-3 weeks. Thanks.

## 2020-05-30 NOTE — Telephone Encounter (Signed)
Patient informed of referral.  .Meril Dray R Destiny Hagin, CMA  

## 2020-06-03 MED FILL — tiZANidine HCL 4 MG TABS: 4 | 10 days supply | Qty: 30 | Fill #2

## 2020-06-04 ENCOUNTER — Other Ambulatory Visit: Payer: Self-pay | Admitting: Family Medicine

## 2020-06-05 ENCOUNTER — Other Ambulatory Visit: Payer: Self-pay | Admitting: Family Medicine

## 2020-06-05 DIAGNOSIS — M069 Rheumatoid arthritis, unspecified: Secondary | ICD-10-CM

## 2020-06-06 ENCOUNTER — Telehealth: Payer: Self-pay

## 2020-06-06 NOTE — Telephone Encounter (Signed)
Received phone call from pharmacy regarding diclofenac gel rx. Per pharmacist there needs to be a specific quantity in the signature. Rx need to be modified to say, "Apply ___ grams to affected area up to four times daily as needed."  Routing to prescribing doctor  Veronda Prude, RN

## 2020-06-07 ENCOUNTER — Other Ambulatory Visit: Payer: Self-pay | Admitting: Family Medicine

## 2020-06-07 ENCOUNTER — Telehealth: Payer: Self-pay

## 2020-06-07 MED ORDER — DICLOFENAC SODIUM 1 % EX GEL: CUTANEOUS | 2 refills | Status: DC

## 2020-06-07 MED FILL — DICLOFENAC SODIUM 1 % GEL: 1 | 25 days supply | Qty: 200 | Fill #0

## 2020-06-07 NOTE — Telephone Encounter (Signed)
Patient calls nurse line requesting prednisone 10 mg for a 30 day supply. Patient reports that prednisone has been the only thing helping with her flare ups. Patient has follow up appointment scheduled for PCPs first available appointment.  Please advise.   Veronda Prude, RN

## 2020-06-07 NOTE — Telephone Encounter (Signed)
Patient returns call to nurse line stating that she missed call from provider. Unable to find documentation of who called patient.   Veronda Prude, RN

## 2020-06-07 NOTE — Addendum Note (Signed)
Addended by: Veronda Prude on: 06/07/2020 03:25 PM   Modules accepted: Orders

## 2020-06-07 NOTE — Progress Notes (Signed)
Rx was sent to wrong pharmacy. Resending rx to [facility] per patient request. Called and cancelled original rx at [facility].   To Dr. Elita Quick, RN

## 2020-06-10 ENCOUNTER — Other Ambulatory Visit: Payer: Self-pay | Admitting: Family Medicine

## 2020-06-10 DIAGNOSIS — M069 Rheumatoid arthritis, unspecified: Secondary | ICD-10-CM

## 2020-06-10 NOTE — Telephone Encounter (Signed)
Spoke to patient regarding refill request. She continues to experience pain in her joints concerning for flare. She has not been able to continue care at her prior rheumatologist in [facility] thus she has not been on her maintenance medications (hydroxychloroquine). They will not provide refills until she obtains blood work. She is unable to do this due to her transportation difficulties.   Requested that she talk to her rheumatologist to determine what labs are needed and if able to obtain them at [facility] and then have the labs faxed over to rheumatologist office. She then could possible have telemedicine visit to follow up, review labs and obtain refills.  She also has appointment with me on 9/13 where I plan to do regular screening including CBC, CMP, diabetes, lipids, BMD/fracture risk assessment.   Will provide refill for prednisone 10mg  QD at this time for flare while she reaches out to her rheumatologist.  Also sent another referral for local rheumatologist.

## 2020-06-11 MED FILL — predniSONE 10 MG TABS: 10 | 30 days supply | Qty: 30 | Fill #0

## 2020-06-11 NOTE — Telephone Encounter (Signed)
Patient calls nurse line regarding receiving tramadol and Plaquenil rx. Patient states that she spoke with Dr. Sharmon Revere who advised patient to reach out to PCP for refills.   Forwarding to PCP  Veronda Prude, RN

## 2020-06-12 ENCOUNTER — Other Ambulatory Visit: Payer: Self-pay | Admitting: Family Medicine

## 2020-06-12 DIAGNOSIS — M069 Rheumatoid arthritis, unspecified: Secondary | ICD-10-CM

## 2020-06-12 MED ORDER — PREDNISONE 5 MG PO TABS: 5.0000 mg | ORAL_TABLET | Freq: Every day | ORAL | 1 refills | Status: DC

## 2020-06-12 MED ORDER — HYDROXYCHLOROQUINE SULFATE 200 MG PO TABS: 200.0000 mg | ORAL_TABLET | Freq: Every day | ORAL | 1 refills | Status: DC

## 2020-06-12 NOTE — Telephone Encounter (Signed)
Will provide refills temporarily, however urged patient to call medicaid caseworker to apply for transportation to allow her to continue care with Dr. Herma Carson.  Follow up with me scheduled for 9/13 - plan to obtain maintenance labs including CBC, CMP (renal and hepatic function). Recommend yearly eye exam.

## 2020-06-18 ENCOUNTER — Other Ambulatory Visit: Payer: Self-pay | Admitting: Family Medicine

## 2020-06-18 DIAGNOSIS — M069 Rheumatoid arthritis, unspecified: Secondary | ICD-10-CM

## 2020-07-07 NOTE — Progress Notes (Signed)
Subjective:   Patient ID: Kathy [name]    DOB: 12-25-1965, 54 y.o. female   MRN: [id number]  Kathy [name] is a 54 y.o. female with a history of  asthma, GERD, rheumatoid arthritis, degenerative joint disease, osteoarthritis, anemia, panic disorder here for follow up.  Rheumatoid Arthritis: Has been unable to follow up with rheumatologist due to transportation issues. Last appointment 02/2019. She was urged to talk to [facility] caseworker to apply for transportation. She notes she was able to set up transportation and has appointment with Dr. Sharmon Revere on 09/16/20.  Currently on Prednisone 5mg  QD, hydroxychloroquine 200mg  QD, and Tramadol Prn for pain.  Last CBC and CMP obtained in April 2021 with stable renal and hepatic function.  She should have yearly eye exam. Patient notes this is scheduled for 09/10/20.  BP 162/88 today. Repeat: 140/80. Denies any CP or SOB. She notes she is having acute right shoulder pain. She has history of her RA affecting her shoulders and neck in the past. She is not interested in intraarticular steroid injection.   Health Maintenance: Due for flu vaccine, mammogram, colon cancer screen, and Hep C Patient given FOBT at prior visit.  Mammogram ordered at last visit.  Review of Systems:  Per HPI.   Objective:   BP (!) 162/88   Pulse (!) 125   Ht 5\' 5"  (1.651 m)   Wt 205 lb 3.2 oz (93.1 kg)   SpO2 97%   BMI 34.15 kg/m  Vitals and nursing note reviewed.  General: pleasant older woman, sitting comfortably in exam chair, well nourished, well developed, in no acute distress with non-toxic appearance CV: regular rate and rhythm without murmurs, rubs, or gallops, no lower extremity edema, 2+ radial and pedal pulses bilaterally Lungs: clear to auscultation bilaterally with normal work of breathing on room air Skin: warm, dry, no acute erythema along joints Extremities/MSK: warm and well perfused, pain noted along right shoulder with ROM, no erythema or  swelling appreciated. Fingers bilaterally with evidence of RA deformities  Neuro: Alert and oriented, speech normal  Assessment & Plan:   Rheumatoid arthritis (HCC) Acute on chronic. Continues to have pain in joints, priamrily her right shoulder during exam. Endorses compliance with Plaquenil 200mg , Prednisone 5mg  QD, and Tramadol PRN.  - yearly eye exam scheduled for 11/16 - follow up with rheumatologist 11/22 - DEXA scan scheduled given chronic steroid use - recommended Vit D and Calcium for bone protection - BP improved today. Suspect elevation due to pain today. Continue to monitor.  - weight is stable  - last LDL was 118. The 10-year ASCVD risk score DC Jr., et al., 2013) is: 6.4%. Would likely benefit from statin. Will start atorvastatin 40mg  QD. - will obtain A1C at follow up - continue Plaquenil 200mg  QD, Prednisone 5mg  QD, and Zanaflex 4mg  PRN, Tramadol PRN for pain. Appears patient is also supposed to be on tofacitinib (XELJANZ XR) 11 mg QD. Will reach out to rheumatologist for further recs. - follow up in 3 months  Panic disorder Chronic. Well controlled with Celexa 40mg  QD. Requests refill. PHQ-9 score of 0 today.  - refill provided  Hyperlipidemia The 10-year ASCVD risk score DC Jr., et al., 2013) is: 6.4% Given chronic prednisone use, would likely benefit from statin. - Start Lipitor 40mg  QD - follow up LDL in 3-6 months  Elevated blood-pressure reading without diagnosis of hypertension Elevated BP during exam today. Repeat BP still elevated but improved. All prior BPs were well controlled.  Suspect acute pain is contributing. We'll continue to monitor at this time. If persists, will consider starting antihypertensive.  Gastroesophageal reflux disease without esophagitis Was on ranitidine but has discontinued given recall. Will transition to Pepcid for PRN use.   Orders Placed This Encounter  Procedures  . DG Bone Density    Standing Status:   Future     Standing Expiration Date:   07/08/2021    Order Specific Question:   Reason for Exam (SYMPTOM  OR DIAGNOSIS REQUIRED)    Answer:   screen for osteoporosis due to chronic prendisone    Order Specific Question:   Is the patient pregnant?    Answer:   No    Order Specific Question:   Preferred imaging location?    Answer:   [facility]  . Flu Vaccine QUAD 36+ mos IM   Meds ordered this encounter  Medications  . diclofenac Sodium (VOLTAREN) 1 % GEL    Sig: Apply 2g 4 times daily to affected area as needed for pain.    Dispense:  500 g    Refill:  2  . hydroxychloroquine (PLAQUENIL) 200 MG tablet    Sig: Take 1 tablet (200 mg total) by mouth daily.    Dispense:  30 tablet    Refill:  0  . traMADol (ULTRAM) 50 MG tablet    Sig: Take 1 tablet (50 mg total) by mouth every 8 (eight) hours as needed.    Dispense:  90 tablet    Refill:  0  . tiZANidine (ZANAFLEX) 4 MG tablet    Sig: TAKE 1 TABLET BY MOUTH EVERY 8 HOURS AS NEEDED FOR MUSCLE SPASM    Dispense:  30 tablet    Refill:  3    DX Code Needed  .  . citalopram (CELEXA) 40 MG tablet    Sig: TAKE ONE (1) TABLET BY MOUTH EVERY DAY.    Dispense:  90 tablet    Refill:  2  . atorvastatin (LIPITOR) 40 MG tablet    Sig: Take 1 tablet (40 mg total) by mouth daily.    Dispense:  90 tablet    Refill:  3    Orpah Cobb, DO PGY-3, [facility] 07/12/2020 4:41 PM

## 2020-07-08 ENCOUNTER — Ambulatory Visit (INDEPENDENT_AMBULATORY_CARE_PROVIDER_SITE_OTHER): Payer: Medicaid Other | Admitting: Family Medicine

## 2020-07-08 ENCOUNTER — Encounter: Payer: Self-pay | Admitting: Family Medicine

## 2020-07-08 ENCOUNTER — Other Ambulatory Visit: Payer: Self-pay

## 2020-07-08 VITALS — BP 162/88 | HR 125 | Ht 65.0 in | Wt 205.2 lb

## 2020-07-08 DIAGNOSIS — R030 Elevated blood-pressure reading, without diagnosis of hypertension: Secondary | ICD-10-CM | POA: Diagnosis not present

## 2020-07-08 DIAGNOSIS — K219 Gastro-esophageal reflux disease without esophagitis: Secondary | ICD-10-CM

## 2020-07-08 DIAGNOSIS — E2839 Other primary ovarian failure: Secondary | ICD-10-CM | POA: Diagnosis not present

## 2020-07-08 DIAGNOSIS — M069 Rheumatoid arthritis, unspecified: Secondary | ICD-10-CM | POA: Diagnosis not present

## 2020-07-08 DIAGNOSIS — F410 Panic disorder [episodic paroxysmal anxiety] without agoraphobia: Secondary | ICD-10-CM

## 2020-07-08 DIAGNOSIS — Z7952 Long term (current) use of systemic steroids: Secondary | ICD-10-CM | POA: Diagnosis not present

## 2020-07-08 DIAGNOSIS — Z23 Encounter for immunization: Secondary | ICD-10-CM | POA: Diagnosis not present

## 2020-07-08 DIAGNOSIS — E782 Mixed hyperlipidemia: Secondary | ICD-10-CM

## 2020-07-08 MED ORDER — DICLOFENAC SODIUM 1 % EX GEL: CUTANEOUS | 2 refills | Status: DC

## 2020-07-08 MED ORDER — HYDROXYCHLOROQUINE SULFATE 200 MG PO TABS: 200.0000 mg | ORAL_TABLET | Freq: Every day | ORAL | 0 refills | Status: DC

## 2020-07-08 MED ORDER — CITALOPRAM HYDROBROMIDE 40 MG PO TABS: ORAL_TABLET | ORAL | 2 refills | Status: DC

## 2020-07-08 MED ORDER — TIZANIDINE HCL 4 MG PO TABS: ORAL_TABLET | ORAL | 3 refills | Status: DC

## 2020-07-08 MED ORDER — TRAMADOL HCL 50 MG PO TABS: 50.0000 mg | ORAL_TABLET | Freq: Three times a day (TID) | ORAL | 0 refills | Status: AC | PRN

## 2020-07-08 NOTE — Patient Instructions (Addendum)
YOU MUST SCHEDULE YOUR EYE EXAM AND HAVE IT COMPLETED BEFORE I CAN REFILL YOUR MEDICATIONS. Please have them fax over the results to [phone]   I have called prescriptions for your rheumatoid arthritis.  Please have your DEXA scan completed as soon as you can. This looks for osteoperosis. I recommend a Vitamin D and calcium supplement.   Please follow up with me 3 months, sooner if needed.

## 2020-07-09 ENCOUNTER — Other Ambulatory Visit: Payer: Self-pay

## 2020-07-09 DIAGNOSIS — K219 Gastro-esophageal reflux disease without esophagitis: Secondary | ICD-10-CM

## 2020-07-11 MED ORDER — FAMOTIDINE 10 MG PO TABS: 10.0000 mg | ORAL_TABLET | Freq: Two times a day (BID) | ORAL | 11 refills | Status: DC

## 2020-07-12 ENCOUNTER — Other Ambulatory Visit: Payer: Self-pay | Admitting: Family Medicine

## 2020-07-12 DIAGNOSIS — M069 Rheumatoid arthritis, unspecified: Secondary | ICD-10-CM

## 2020-07-12 DIAGNOSIS — I10 Essential (primary) hypertension: Secondary | ICD-10-CM | POA: Insufficient documentation

## 2020-07-12 DIAGNOSIS — Z7952 Long term (current) use of systemic steroids: Secondary | ICD-10-CM | POA: Insufficient documentation

## 2020-07-12 MED ORDER — PREDNISONE 5 MG PO TABS: 10.0000 mg | ORAL_TABLET | Freq: Every day | ORAL | 0 refills | Status: DC

## 2020-07-12 MED ORDER — ATORVASTATIN CALCIUM 40 MG PO TABS: 40.0000 mg | ORAL_TABLET | Freq: Every day | ORAL | 3 refills | Status: DC

## 2020-07-12 NOTE — Assessment & Plan Note (Signed)
The 10-year ASCVD risk score Denman George DC Jr., et al., 2013) is: 6.4% Given chronic prednisone use, would likely benefit from statin. - Start Lipitor 40mg  QD - follow up LDL in 3-6 months

## 2020-07-12 NOTE — Assessment & Plan Note (Addendum)
Elevated BP during exam today. Repeat BP still elevated but improved. All prior BPs were well controlled. Suspect acute pain is contributing. We'll continue to monitor at this time. If persists, will consider starting antihypertensive.

## 2020-07-12 NOTE — Assessment & Plan Note (Signed)
Chronic. Well controlled with Celexa 40mg  QD. Requests refill. PHQ-9 score of 0 today.  - refill provided

## 2020-07-12 NOTE — Progress Notes (Signed)
Spoke to rheumatologist, Dr. Herma Carson, regarding rheumatoid arthritis. Current medications should include: Plaquenil 200mg  QD, Prednisone 5mg  QD, Tofacitinib 11mg  QD, and Tramadol 50mg  q8 PRN  Recommendations: - obtain CBC, CMP q3 months - restart Tofacitinib if labs stable - can increase Prednisone 10mg  QD until patient follows up in November   Plan: - inc pred to 10mg  QD until her appt on 09/16/20 with Dr. - obtain CBC, CMP - if normal, will restart Tofacitinib 11mg  QD - patient scheduled for labs for 9/21

## 2020-07-12 NOTE — Assessment & Plan Note (Addendum)
Acute on chronic. Continues to have pain in joints, priamrily her right shoulder during exam. Endorses compliance with Plaquenil 200mg , Prednisone 5mg  QD, and Tramadol PRN.  - yearly eye exam scheduled for 11/16 - follow up with rheumatologist 11/22 - DEXA scan scheduled given chronic steroid use - recommended Vit D and Calcium for bone protection - BP improved today. Suspect elevation due to pain today. Continue to monitor.  - weight is stable  - last LDL was 118. The 10-year ASCVD risk score 12/16 DC Jr., et al., 2013) is: 6.4%. Would likely benefit from statin. Will start atorvastatin 40mg  QD. - will obtain A1C at follow up - continue Plaquenil 200mg  QD, Prednisone 5mg  QD, and Zanaflex 4mg  PRN, Tramadol PRN for pain. Appears patient is also supposed to be on tofacitinib (XELJANZ XR) 11 mg QD. Will reach out to rheumatologist for further recs. - follow up in 3 months

## 2020-07-12 NOTE — Assessment & Plan Note (Signed)
Was on ranitidine but has discontinued given recall. Will transition to Pepcid for PRN use.

## 2020-07-16 ENCOUNTER — Other Ambulatory Visit: Payer: Medicaid Other

## 2020-08-07 ENCOUNTER — Other Ambulatory Visit: Payer: Self-pay

## 2020-08-08 ENCOUNTER — Other Ambulatory Visit: Payer: Self-pay | Admitting: *Deleted

## 2020-08-08 ENCOUNTER — Other Ambulatory Visit: Payer: Self-pay | Admitting: Family Medicine

## 2020-08-15 DIAGNOSIS — M0609 Rheumatoid arthritis without rheumatoid factor, multiple sites: Secondary | ICD-10-CM | POA: Diagnosis not present

## 2020-08-15 DIAGNOSIS — Z79899 Other long term (current) drug therapy: Secondary | ICD-10-CM | POA: Diagnosis not present

## 2020-08-15 DIAGNOSIS — M2550 Pain in unspecified joint: Secondary | ICD-10-CM | POA: Diagnosis not present

## 2020-08-20 ENCOUNTER — Other Ambulatory Visit: Payer: Self-pay | Admitting: Family Medicine

## 2020-08-20 DIAGNOSIS — M069 Rheumatoid arthritis, unspecified: Secondary | ICD-10-CM

## 2020-09-01 ENCOUNTER — Other Ambulatory Visit: Payer: Self-pay | Admitting: Family Medicine

## 2020-09-01 DIAGNOSIS — D649 Anemia, unspecified: Secondary | ICD-10-CM

## 2020-09-11 ENCOUNTER — Ambulatory Visit: Payer: Medicaid Other | Admitting: Internal Medicine

## 2020-10-10 NOTE — Progress Notes (Signed)
Office Visit Note  Patient: Kathy [name]             Date of Birth: February 10, 1966           MRN: [id number]             PCP: Danna Hefty, DO Referring: Kinnie Feil, MD Visit Date: 10/11/2020   Subjective:  Pain of the Left Hand, Pain of the Right Hand, Pain of the Left Shoulder, Pain of the Right Shoulder, Pain of the Neck, New Patient (Initial Visit) (Current regiment helps some/Transfer of care due to location), and Joint Pain   History of Present Illness: Kathy [name] is a 54 y.o. female here to establish care of rheumatoid arthritis for which she previous saw Dr. Genene Churn in [facility] but is looking for an office closer to her residence due to difficulty with transportation. She has a history of highly active RA for years with pain that is worst in her shoulders, hands, knees, and feet with deforming arthritis of hands and feet and has had bilateral knee replacements for arthritis. Her most recent visit with their office was in October with disease in exacerbation at that time and she took a prednisone taper but has remained active taking 15mg  of prednisone currently 10mg  AM and 5mg  PM.  LAbs reviewed 07/2020 ESR 140  Medications MTX discontinued for nausea HCQ Tofacitinib Prednisone  Activities of Daily Living:  Patient reports morning stiffness for 4 hours.   Patient Reports nocturnal pain.  Difficulty dressing/grooming: Reports Difficulty climbing stairs: Denies Difficulty getting out of chair: Reports Difficulty using hands for taps, buttons, cutlery, and/or writing: Reports  Review of Systems  Constitutional: Negative for fatigue.  HENT: Negative for mouth sores, mouth dryness and nose dryness.   Eyes: Positive for itching. Negative for pain, visual disturbance and dryness.  Respiratory: Positive for shortness of breath. Negative for cough, hemoptysis and difficulty breathing.   Cardiovascular: Negative for chest pain, palpitations and swelling  in legs/feet.  Gastrointestinal: Negative for abdominal pain, blood in stool, constipation and diarrhea.  Endocrine: Negative for increased urination.  Genitourinary: Negative for painful urination.  Musculoskeletal: Positive for arthralgias, joint pain, joint swelling, myalgias, morning stiffness and myalgias. Negative for muscle weakness and muscle tenderness.  Skin: Negative for color change, rash and redness.  Allergic/Immunologic: Negative for susceptible to infections.  Neurological: Negative for dizziness, numbness, headaches, memory loss and weakness.  Hematological: Negative for swollen glands.  Psychiatric/Behavioral: Negative for confusion and sleep disturbance.    PMFS History:  Patient Active Problem List   Diagnosis Date Noted  . High risk medication use 10/11/2020  . Current chronic use of systemic steroids 07/12/2020  . Elevated blood-pressure reading without diagnosis of hypertension 07/12/2020  . Hyperlipidemia 02/22/2020  . Gastroesophageal reflux disease without esophagitis 06/15/2019  . Asthma   . Panic disorder 07/23/2015  . 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 Rheumatoid arthritis ([place]) 09/15/2009  . DEGENERATIVE JOINT DISEASE, GENERALIZED 07/28/2007    Past Medical History:  Diagnosis Date  . Asthma    seasonal  . GERD (gastroesophageal reflux disease)   . Rheumatoid arteritis (HCC)     Family History  Problem Relation Age of Onset  . Asthma Mother   . Diabetes Mother   . Hypertension Mother   . Heart disease Mother   . Asthma Father   . Cancer Father        lung  . Cancer Sister  breast at 30  . Cancer Brother        unknown   Past Surgical History:  Procedure Laterality Date  . ABDOMINAL HYSTERECTOMY    . TOTAL KNEE ARTHROPLASTY Right 06/18/2015   Procedure: RIGHT TOTAL KNEE ARTHROPLASTY;  Surgeon: Mcarthur Rossetti, MD;  Location: [place];  Service: Orthopedics;  Laterality: Right;  . TOTAL KNEE ARTHROPLASTY  Left 05/11/2017   Procedure: LEFT TOTAL KNEE ARTHROPLASTY;  Surgeon: Mcarthur Rossetti, MD;  Location: [place];  Service: Orthopedics;  Laterality: Left;   Social History   Social History Narrative  . Not on file   Immunization History  Administered Date(s) Administered  . Influenza,inj,Quad PF,6+ Mos 09/19/2014, 07/23/2015, 08/04/2018, 07/08/2020  . PFIZER SARS-COV-2 Vaccination 02/29/2020, 03/26/2020  . Pneumococcal Polysaccharide-23 10/26/2002  . Tdap 08/04/2018     Objective: Vital Signs: BP (!) 153/102 (BP Location: Right Arm, Patient Position: Sitting, Cuff Size: Normal)   Pulse (!) 111   Ht 5' 1.75" (1.568 m)   Wt 212 lb 12.8 oz (96.5 kg)   BMI 39.24 kg/m    Physical Exam Constitutional:      Appearance: She is obese.  HENT:     Right Ear: External ear normal.     Left Ear: External ear normal.     Mouth/Throat:     Mouth: Mucous membranes are moist.     Pharynx: Oropharynx is clear.  Eyes:     Conjunctiva/sclera: Conjunctivae normal.  Cardiovascular:     Rate and Rhythm: Normal rate and regular rhythm.  Pulmonary:     Effort: Pulmonary effort is normal.     Breath sounds: Normal breath sounds.  Skin:    General: Skin is warm and dry.     Findings: No rash.  Neurological:     General: No focal deficit present.     Mental Status: She is alert.      Musculoskeletal Exam:  Neck range of motion reduced in lateral rotation on both sides Shoulder range of motion slightly decreased in abduction bilaterally, left shoulder internal rotation slightly decreased flexion and extension normal Right elbow swelling and severely reduced range of motion left elbow without swelling but also range of motion less than 60 degrees Reduced wrist flexion extension bilaterally swelling on the right side Right hand second third MCP and third PIP swollen and tender to palpation other MCPs tender without swelling Left hand MCP tenderness no overlying swelling or erythema chronic  changes with MCP subluxation and ulnar deviation present very prominent swan-neck deformities on the left hand Z-shaped deformity of the thumbs bilaterally Normal hip internal range of motion, right hip lateral tenderness to palpation Knees well-healed surgical scars full range of motion no swelling Ankles possible swelling versus overlying soft tissue edema Cocked up toe deformities of MTP joints bilaterally without tenderness or swelling  CDAI Exam: CDAI Score: 13.5  Patient Global: 7 mm; Provider Global: 8 mm Swollen: 5 ; Tender: 7  Joint Exam 10/11/2020      Right  Left  Glenohumeral   Tender   Tender  Elbow  Swollen Tender     Wrist  Swollen Tender     MCP 2  Swollen Tender     MCP 3  Swollen Tender     PIP 3  Swollen Tender        Investigation: No additional findings.  Imaging: No results found.  Recent Labs: Lab Results  Component Value Date   WBC 8.9 02/20/2020   HGB 11.6 02/20/2020  PLT 457 (H) 02/20/2020   NA 139 02/20/2020   K 4.2 02/20/2020   CL 101 02/20/2020   CO2 24 02/20/2020   GLUCOSE 86 02/20/2020   BUN 14 02/20/2020   CREATININE 0.66 02/20/2020   BILITOT 0.4 02/20/2020   ALKPHOS 118 (H) 02/20/2020   AST 33 02/20/2020   ALT 32 02/20/2020   PROT 8.5 02/20/2020   ALBUMIN 4.4 02/20/2020   CALCIUM 9.8 02/20/2020   GFRAA 117 02/20/2020    Speciality Comments: No specialty comments available.  Procedures:  No procedures performed Allergies: Methotrexate derivatives, Peanut-containing drug products, Acyclovir and related, Aspirin, Eggs or egg-derived products, and Tomato   Assessment / Plan:     Visit Diagnoses: Rheumatoid arthritis involving multiple sites, unspecified whether rheumatoid factor present ([place]) - Plan: hydroxychloroquine (PLAQUENIL) 200 MG tablet, Tofacitinib Citrate ER 11 MG TB24, Sedimentation rate, predniSONE (DELTASONE) 5 MG tablet  Ms. Leinbach has extensive structural changes but also active inflammatory arthritis today  especially in the right arm.  Shoulder pain but no obvious swelling on exam.  She is currently on hydroxychloroquine 200, atorvastatin 11 mg daily, as well as prednisone total daily dose of 15 mg still has moderate disease activity.  I think she would likely benefit trying a different DMARD.  She is very hesitant towards starting an infusion or injectable type medication.  We discussed trying a different Jak inhibitor and she is interested in this.  We will plan to repeat baseline labs today we will continue on the current regimen and follow-up in a few weeks if picture looks the same plan to switch medications at that time.  Current chronic use of systemic steroids Gastroesophageal reflux disease without esophagitis High risk medication use - Plan: CBC with Differential/Platelet, COMPLETE METABOLIC PANEL WITH GFR, QuantiFERON-TB Gold Plus, Serum protein electrophoresis with reflex, IgG, IgA, IgM  CBC CMP QuantiFERON SPEP and immunoglobulins ordered today.  Also discussed recommendation to take prednisone entire daily dose in the morning to minimize effects on cortisol regulation.  Bone densitometry scan has already been ordered by her primary care office  Orders: Orders Placed This Encounter  Procedures  . CBC with Differential/Platelet  . COMPLETE METABOLIC PANEL WITH GFR  . Sedimentation rate  . QuantiFERON-TB Gold Plus  . Serum protein electrophoresis with reflex  . IgG, IgA, IgM   Meds ordered this encounter  Medications  . hydroxychloroquine (PLAQUENIL) 200 MG tablet    Sig: Take 1 tablet (200 mg total) by mouth daily.    Dispense:  30 tablet    Refill:  0  . Tofacitinib Citrate ER 11 MG TB24    Sig: Take 11 mg by mouth daily.    Dispense:  30 tablet    Refill:  0  . predniSONE (DELTASONE) 5 MG tablet    Sig: Take 3 tablets (15 mg total) by mouth daily with breakfast.    Dispense:  90 tablet    Refill:  0     Follow-Up Instructions: Return in about 4 weeks (around  11/08/2020).   Collier Salina, MD  Note - This record has been created using [employer].  Chart creation errors have been sought, but may not always  have been located. Such creation errors do not reflect on  the standard of medical care.

## 2020-10-11 ENCOUNTER — Ambulatory Visit: Payer: Medicaid Other | Admitting: Internal Medicine

## 2020-10-11 ENCOUNTER — Other Ambulatory Visit: Payer: Self-pay

## 2020-10-11 ENCOUNTER — Encounter: Payer: Self-pay | Admitting: Internal Medicine

## 2020-10-11 VITALS — BP 153/102 | HR 111 | Ht 61.75 in | Wt 212.8 lb

## 2020-10-11 DIAGNOSIS — Z79899 Other long term (current) drug therapy: Secondary | ICD-10-CM

## 2020-10-11 DIAGNOSIS — Z7952 Long term (current) use of systemic steroids: Secondary | ICD-10-CM | POA: Diagnosis not present

## 2020-10-11 DIAGNOSIS — M069 Rheumatoid arthritis, unspecified: Secondary | ICD-10-CM

## 2020-10-11 DIAGNOSIS — K219 Gastro-esophageal reflux disease without esophagitis: Secondary | ICD-10-CM

## 2020-10-11 MED ORDER — PREDNISONE 5 MG PO TABS: 15.0000 mg | ORAL_TABLET | Freq: Every day | ORAL | 0 refills | Status: DC

## 2020-10-11 MED ORDER — TOFACITINIB CITRATE ER 11 MG PO TB24
11.0000 mg | ORAL_TABLET | Freq: Every day | ORAL | 0 refills | Status: DC
Start: 2020-10-11 — End: 2020-12-03

## 2020-10-11 MED ORDER — HYDROXYCHLOROQUINE SULFATE 200 MG PO TABS
200.0000 mg | ORAL_TABLET | Freq: Every day | ORAL | 0 refills | Status: DC
Start: 2020-10-11 — End: 2020-12-03

## 2020-10-11 NOTE — Patient Instructions (Signed)
You still have active rheumatoid arthritis on the current Xeljanz and while taking prednisone 15mg  daily. I believe we should consider switching to a different medication to see if we can get more symptom improvement and use less prednisone. At our next visit we will recheck how you are doing and can try switching to an alternative oral medication called Rinvoq or Upadacitinib.   Upadacitinib extended-release tablets What is this medicine? UPADACITINIB (ue PAD a SYE ti nib) is a medicine that works on the immune system. This medicine is used to treat rheumatoid arthritis. This medicine may be used for other purposes; ask your health care provider or pharmacist if you have questions. COMMON BRAND NAME(S): RINVOQ What should I tell my health care provider before I take this medicine? They need to know if you have any of these conditions:  cancer  diabetes  high cholesterol  HIV or AIDs  immune system problems  infection (especially a virus infection such as hepatitis B, chickenpox, cold sores, or herpes)  liver disease  low blood counts, like low white cell, platelets, or red cell counts  history of blood clots  lung or breathing disease, like asthma  organ transplant  stomach or intestine problems  tuberculosis, a positive skin test for tuberculosis, or have recently been in close contact with someone who has tuberculosis  an unusual or allergic reaction to upadacitinib, other medicines, foods, dyes or preservatives  pregnant or trying to get pregnant  breast-feeding How should I use this medicine? Take this medicine by mouth with a full glass of water. Follow the directions on the prescription label. Do not cut, crush, or chew this medicine. Swallow the tablets whole. You can take it with or without food. Take your medicine at regular intervals. Do not take more often then directed. Do not stop taking except on your doctor's advice. A special MedGuide will be given to you  by the pharmacist with each prescription and refill. Be sure to read this information carefully each time. Talk to your pediatrician regarding the use of this medicine in children. Special care may be needed. Overdosage: If you think you have taken too much of this medicine contact a poison control center or emergency room at once. NOTE: This medicine is only for you. Do not share this medicine with others. What if I miss a dose? If you miss a dose, take it as soon as you can. If it is almost time for your next dose, take only that dose. Do not take double or extra doses. What may interact with this medicine? Do not take this medicine with any of the following medications:  baricitinib  tofacitinib This medicine may also interact with the following medications:  azathioprine, cyclosporine, or other immunosuppressive drugs  biologic medicines such as abatacept, adalimumab, anakinra, certolizumab, etanercept, golimumab, infliximab, rituximab, secukinumab, tocilizumab, ustekinumab  certain medicines for fungal infections like ketoconazole, itraconazole, or posaconazole  certain medicines for seizures like carbamazepine, phenobarbital, phenytoin  clarithromycin  live vaccines  rifampin  supplements, such as St. John's wort This list may not describe all possible interactions. Give your health care provider a list of all the medicines, herbs, non-prescription drugs, or dietary supplements you use. Also tell them if you smoke, drink alcohol, or use illegal drugs. Some items may interact with your medicine. What should I watch for while using this medicine? Visit your healthcare professional for regular checks on your progress. Tell your healthcare professional if your symptoms do not start to get better  or if they get worse. You may need blood work done while you are taking this medicine. Avoid taking products that contain aspirin, acetaminophen, ibuprofen, naproxen, or ketoprofen unless  instructed by your doctor. These medicines may hide a fever. Call your doctor or healthcare professional for advice if you get a fever, chills or sore throat or other symptoms of a cold or flu. Do not treat yourself. This drug decreases your body's ability to fight infections. Try to avoid being around people who are sick. Do not become pregnant while taking this medicine. Women should inform their healthcare professional if they wish to become pregnant or think they might be pregnant. There is potential for serious side effects and harm to an unborn child. Talk to your healthcare professional for more information. Do not breast-feed an infant while taking this medicine or for 6 days after stopping it. What side effects may I notice from receiving this medicine? Side effects that you should report to your doctor or health care professional as soon as possible:  allergic reactions like skin rash, itching or hives, swelling of the face, lips, or tongue  breathing problems  signs and symptoms of a blood clot such as breathing problems; changes in vision; chest pain; severe, sudden headache; pain, swelling, warmth in the leg; trouble speaking; sudden numbness or weakness of the face, arm, or leg  sign and symptoms of infection like fever or chills; cough; sore throat; pain or trouble passing urine  signs and symptoms of liver injury like dark yellow or brown urine; general ill feeling or flu-like symptoms; light-colored stools; loss of appetite; nausea; right upper belly pain; unusually weak or tired; yellowing of the eyes or skin  stomach pain or a sudden change in bowel habits  unusually weak or tired Side effects that usually do not require medical attention (report these to your doctor or health care professional if they continue or are bothersome):  nausea  runny nose  sinus trouble This list may not describe all possible side effects. Call your doctor for medical advice about side  effects. You may report side effects to FDA at 1-800-FDA-1088. Where should I keep my medicine? Keep out of the reach of children. Store between 2 and 25 degrees C (36 and 77 degrees F). Keep this medicine in the original container. Throw away any unused medicine after the expiration date. NOTE: This sheet is a summary. It may not cover all possible information. If you have questions about this medicine, talk to your doctor, pharmacist, or health care provider.  2020 Elsevier/Gold Standard (2018-06-15 01:45:07)

## 2020-10-14 NOTE — Progress Notes (Signed)
Lab tests show very high inflammation that is consistent with her joint inflammation on our exam. If symptoms are similar at our follow up visit we will need to try changing medications to find something more effective since this would be more than just a flare up. Blood count and liver function tests are normal so no problems with the medication.

## 2020-10-16 ENCOUNTER — Telehealth: Payer: Self-pay | Admitting: Pharmacy Technician

## 2020-10-16 NOTE — Telephone Encounter (Signed)
Submitted a Prior Authorization request to [facility] for XELJANZ XR via Cover My Meds. Will update once we receive a response.   ([employer]oker) - [id number]

## 2020-10-17 LAB — QUANTIFERON-TB GOLD PLUS
Mitogen-NIL: 1.76 IU/mL
NIL: 0.05 IU/mL
QuantiFERON-TB Gold Plus: NEGATIVE
TB1-NIL: <0 IU/mL
TB2-NIL: <0 IU/mL

## 2020-10-17 LAB — CBC WITH DIFFERENTIAL/PLATELET
Absolute Monocytes: 296 cells/uL (ref 200–950)
Basophils Absolute: 16 cells/uL (ref 0–200)
Basophils Relative: 0.2 %
Eosinophils Absolute: 8 cells/uL — ABNORMAL LOW (ref 15–500)
Eosinophils Relative: 0.1 %
HCT: 33.8 % — ABNORMAL LOW (ref 35.0–45.0)
Hemoglobin: 11.5 g/dL — ABNORMAL LOW (ref 11.7–15.5)
Lymphs Abs: 1592 cells/uL (ref 850–3900)
MCH: 30.7 pg (ref 27.0–33.0)
MCHC: 34 g/dL (ref 32.0–36.0)
MCV: 90.4 fL (ref 80.0–100.0)
MPV: 9.5 fL (ref 7.5–12.5)
Monocytes Relative: 3.7 %
Neutro Abs: 6088 cells/uL (ref 1500–7800)
Neutrophils Relative %: 76.1 %
Platelets: 409 10*3/uL — ABNORMAL HIGH (ref 140–400)
RBC: 3.74 10*6/uL — ABNORMAL LOW (ref 3.80–5.10)
RDW: 13.3 % (ref 11.0–15.0)
Total Lymphocyte: 19.9 %
WBC: 8 10*3/uL (ref 3.8–10.8)

## 2020-10-17 LAB — IFE INTERPRETATION: Immunofix Electr Int: DETECTED

## 2020-10-17 LAB — COMPLETE METABOLIC PANEL WITH GFR
AG Ratio: 0.9 (calc) — ABNORMAL LOW (ref 1.0–2.5)
ALT: 25 U/L (ref 6–29)
AST: 16 U/L (ref 10–35)
Albumin: 3.8 g/dL (ref 3.6–5.1)
Alkaline phosphatase (APISO): 94 U/L (ref 37–153)
BUN: 14 mg/dL (ref 7–25)
CO2: 25 mmol/L (ref 20–32)
Calcium: 9.5 mg/dL (ref 8.6–10.4)
Chloride: 103 mmol/L (ref 98–110)
Creat: 0.56 mg/dL (ref 0.50–1.05)
GFR, Est African American: 123 mL/min/{1.73_m2} (ref >=60)
GFR, Est Non African American: 106 mL/min/{1.73_m2} (ref >=60)
Globulin: 4.4 g/dL (calc) — ABNORMAL HIGH (ref 1.9–3.7)
Glucose, Bld: 94 mg/dL (ref 65–99)
Potassium: 3.7 mmol/L (ref 3.5–5.3)
Sodium: 139 mmol/L (ref 135–146)
Total Bilirubin: 0.4 mg/dL (ref 0.2–1.2)
Total Protein: 8.2 g/dL — ABNORMAL HIGH (ref 6.1–8.1)

## 2020-10-17 LAB — PROTEIN ELECTROPHORESIS, SERUM, WITH REFLEX
Abnormal Protein Band1: 0.9 g/dL — ABNORMAL HIGH
Albumin ELP: 3.6 g/dL — ABNORMAL LOW (ref 3.8–4.8)
Alpha 1: 0.6 g/dL — ABNORMAL HIGH (ref 0.2–0.3)
Alpha 2: 1.3 g/dL — ABNORMAL HIGH (ref 0.5–0.9)
Beta 2: 0.5 g/dL (ref 0.2–0.5)
Beta Globulin: 0.5 g/dL (ref 0.4–0.6)
Gamma Globulin: 2 g/dL — ABNORMAL HIGH (ref 0.8–1.7)
Total Protein: 8.5 g/dL — ABNORMAL HIGH (ref 6.1–8.1)

## 2020-10-17 LAB — IGG, IGA, IGM
IgG (Immunoglobin G), Serum: 2149 mg/dL — ABNORMAL HIGH (ref 600–1640)
IgM, Serum: 441 mg/dL — ABNORMAL HIGH (ref 50–300)
Immunoglobulin A: 186 mg/dL (ref 47–310)

## 2020-10-17 LAB — SEDIMENTATION RATE: Sed Rate: >130 mm/h — ABNORMAL HIGH (ref 0–30)

## 2020-10-17 NOTE — Telephone Encounter (Signed)
Received notification from [facility] regarding a prior authorization for [facility]. Authorization has been APPROVED from 10/16/20 to 10/16/21.   Authorization # [id number]

## 2020-10-17 NOTE — Progress Notes (Signed)
Protein electrophoresis shows an increased concentration of one particular type of proteins. This is often related to the chronic immune system activation form rheumatoid arthritis but can also be associated with myeloma. I do not have any specific concern for this based on our exam and history but we will need to check another urine test to follow this up when she comes back for next clinic appointment.

## 2020-10-27 ENCOUNTER — Other Ambulatory Visit: Payer: Self-pay | Admitting: Family Medicine

## 2020-10-27 ENCOUNTER — Other Ambulatory Visit: Payer: Self-pay | Admitting: Internal Medicine

## 2020-10-27 DIAGNOSIS — M069 Rheumatoid arthritis, unspecified: Secondary | ICD-10-CM

## 2020-10-27 DIAGNOSIS — K219 Gastro-esophageal reflux disease without esophagitis: Secondary | ICD-10-CM

## 2020-10-27 DIAGNOSIS — E782 Mixed hyperlipidemia: Secondary | ICD-10-CM

## 2020-10-28 NOTE — Telephone Encounter (Signed)
She should still have this medication available, I sent an Rx for 90 tablets 10/11/20. That being said her appointment is on the 30th and supply would be incomplete so will approve refill early.

## 2020-10-28 NOTE — Telephone Encounter (Signed)
Last Visit: 10/11/2020 Next Visit: 11/14/2020  Current Dose per office note 10/11/2020: Prednisone total daily dose of 15 mg DX: Rheumatoid arthritis involving multiple sites, unspecified whether rheumatoid factor present  Okay to refill Prednisone?

## 2020-11-05 DIAGNOSIS — Z90710 Acquired absence of both cervix and uterus: Secondary | ICD-10-CM | POA: Insufficient documentation

## 2020-11-05 NOTE — Progress Notes (Unsigned)
SUBJECTIVE:   Chief compliant/HPI: annual examination  Kathy [name] is a 55 y.o. who presents today for an annual exam.   PMH: asthma, GERD, DJD, osteoarthritis, RA followed by rheumatology on chronic steroids and immunosuppressants, elevated BP, HLD, panic disorder   HTN:  BP: (!) 152/90 today. Not currently on any antihypertensives. Non-smoker. Denies any chest pain, SOB, vision changes, or headaches.   HLD: Last lipid panel below. Currently on Atorvastatin 40mg  QD. Endorses compliance. Denies any muscles aches or weakness. The 10-year ASCVD risk score DC Jr., et al., 2013) is: 8.2%   Lab Results  Component Value Date   CHOL 186 02/20/2020   HDL 53 02/20/2020   LDLCALC 118 (H) 02/20/2020   TRIG 83 02/20/2020   CHOLHDL 3.5 02/20/2020    Social History: LMP: s/p hysterectomy Alcohol: no Tobacco: no Illicit Drugs: no Safe at home: yes Depression/Suicidality: no  Health Maintenance: Health Maintenance Due  Topic  . Hepatitis C Screening   . COLON CANCER SCREENING ANNUAL FOBT   . MAMMOGRAM   . COVID-19 Vaccine (3 - Booster for 02/22/2020 series)   Family history of breast cancer in sister and colon cancer in father.  Review of systems: see HPI  OBJECTIVE:   BP (!) 152/90   Pulse 100   Ht 5\' 5"  (1.651 m)   Wt 213 lb (96.6 kg)   SpO2 99%   BMI 35.45 kg/m   General: pleasant older woman, sitting comfortably in exam chair, well nourished, well developed, in no acute distress with non-toxic appearance HEENT: normocephalic, atraumatic, moist mucous membranes, oropharynx difficult to assess but overall clear, TM normal bilaterally  CV: regular rate and rhythm without murmurs, rubs, or gallops Lungs: clear to auscultation bilaterally with normal work of breathing on room air, speaking in full sentences Skin: warm, dry Extremities: warm and well perfused, evidence of RA in hands Neuro: Alert and oriented, speech normal  ASSESSMENT/PLAN:   Annual Physical  Exam: Patient here today for annual physical exam.  PMH, surgical history, and social history were reviewed. The following concerns below were discussed.   Gastroesophageal reflux disease without esophagitis Chronic. Well controlled with Pepcid 10mg  QD.  - Refill provided.  Asthma Chronic. Mild intermittent. Albuterol PRN with seldom use. - continue albuterol PRN  Rheumatoid arthritis (HCC) Follows with [employer], MD. Last appointment 10/11/21. Current medications include hydroxychloroquine 200mg  QD, Tofacitinib 11mg  QD, and Prednisone 5mg  QD. Tramadol and Tizandiine filled by PCP for as needed pain. Follow up planned for this month.   Current chronic use of systemic steroids In setting of RA.  A1C today. Hep B and C screening today given no prior screening and long term immunosuppression  Essential hypertension Multiple elevated BP's during last few visits. - Start Amlodipine 5mg  QD - follow up in 2 weeks for BP check - stable kidney function based on last CMP in Dec 2021  Hyperlipidemia Chronic.  - continue Lipitor 40mg  QD - lipid panel today   Health Maintenance:  - encouraged patient to schedule DEXA scan and Mammogram - GI referral for colonoscopy  - follow up for nurses visit in 2 weeks for COVID vaccine per patient request  Annual Examination  See AVS for age appropriate recommendations  PHQ score 1, reviewed and discussed.   Considered the following items based upon USPSTF recommendations: Diabetes screening: screening with yearly random glucose Screening for elevated cholesterol: ordered HIV testing: HIV negative 2018. Not at high risk. Hepatitis C: ordered Hepatitis B: ordered  Syphilis if at high risk: Not at high risk GC/CT not at high risk and not ordered. Osteoporosis screening considered based upon risk of fracture from [facility] calculator. Major osteoporotic fracture risk is 8.8%. DEXA ordered. Patient encouraged to schedule at earliest  convenience. Reviewed risk factors for latent tuberculosis: Quant gold negative in 09/2020  Cervical cancer screening: not indicated given history of hysterectomy with prior normal cytology.  Breast cancer screening: ordered. Patient instructed to schedule at earliest convenience. Colorectal cancer screening: discussed, colonoscopy ordered Lung cancer screening: Not indicated..  Vaccinations: COVID booster in 2 weeks at nurses visit  Follow up in 1 year or sooner if indicated.    [name], DO [facility]

## 2020-11-06 ENCOUNTER — Ambulatory Visit (INDEPENDENT_AMBULATORY_CARE_PROVIDER_SITE_OTHER): Payer: Medicaid Other | Admitting: Family Medicine

## 2020-11-06 ENCOUNTER — Other Ambulatory Visit: Payer: Self-pay

## 2020-11-06 ENCOUNTER — Encounter: Payer: Self-pay | Admitting: Family Medicine

## 2020-11-06 VITALS — BP 152/90 | HR 100 | Ht 65.0 in | Wt 213.0 lb

## 2020-11-06 DIAGNOSIS — M069 Rheumatoid arthritis, unspecified: Secondary | ICD-10-CM | POA: Diagnosis not present

## 2020-11-06 DIAGNOSIS — I10 Essential (primary) hypertension: Secondary | ICD-10-CM | POA: Diagnosis not present

## 2020-11-06 DIAGNOSIS — Z1159 Encounter for screening for other viral diseases: Secondary | ICD-10-CM

## 2020-11-06 DIAGNOSIS — K219 Gastro-esophageal reflux disease without esophagitis: Secondary | ICD-10-CM

## 2020-11-06 DIAGNOSIS — Z1211 Encounter for screening for malignant neoplasm of colon: Secondary | ICD-10-CM

## 2020-11-06 DIAGNOSIS — J4520 Mild intermittent asthma, uncomplicated: Secondary | ICD-10-CM | POA: Diagnosis not present

## 2020-11-06 DIAGNOSIS — Z131 Encounter for screening for diabetes mellitus: Secondary | ICD-10-CM | POA: Diagnosis not present

## 2020-11-06 DIAGNOSIS — Z0000 Encounter for general adult medical examination without abnormal findings: Secondary | ICD-10-CM

## 2020-11-06 DIAGNOSIS — E782 Mixed hyperlipidemia: Secondary | ICD-10-CM | POA: Diagnosis not present

## 2020-11-06 DIAGNOSIS — Z90710 Acquired absence of both cervix and uterus: Secondary | ICD-10-CM

## 2020-11-06 DIAGNOSIS — Z7952 Long term (current) use of systemic steroids: Secondary | ICD-10-CM

## 2020-11-06 DIAGNOSIS — Z800 Family history of malignant neoplasm of digestive organs: Secondary | ICD-10-CM

## 2020-11-06 MED ORDER — AMLODIPINE BESYLATE 5 MG PO TABS: 5.0000 mg | ORAL_TABLET | Freq: Every day | ORAL | 0 refills | Status: DC

## 2020-11-06 MED ORDER — FAMOTIDINE 10 MG PO TABS
10.0000 mg | ORAL_TABLET | Freq: Two times a day (BID) | ORAL | 3 refills | Status: DC
Start: 2020-11-06 — End: 2021-08-21

## 2020-11-06 NOTE — Assessment & Plan Note (Signed)
Chronic. Mild intermittent. Albuterol PRN with seldom use. - continue albuterol PRN

## 2020-11-06 NOTE — Patient Instructions (Signed)
It was a pleasure to see you today!  Thank you for choosing [facility] for your primary care.   Our plans for today were:  Please start taking Amlodipine 5mg  daily. Please return on 11/22/20 at 10:30am for blood pressure check and COVID booster  I have obtained labs. I will call you if anything is positive, otherwise I will send you a letter  To keep you healthy, please keep in mind the following health maintenance items that you are due for:   1. Please call to schedule your mammogram  2. Please call to schedule your bone density scan which look for osteoporosis 3. Please expect a call from the gastroenterologist to schedule your colonoscopy   You should return to our clinic in 1 year for annual exam.   Best Wishes,   11/24/20, DO

## 2020-11-06 NOTE — Assessment & Plan Note (Signed)
Follows with Sheliah Hatch, MD. Last appointment 10/11/21. Current medications include hydroxychloroquine 200mg  QD, Tofacitinib 11mg  QD, and Prednisone 5mg  QD. Tramadol and Tizandiine filled by PCP for as needed pain. Follow up planned for this month.

## 2020-11-06 NOTE — Assessment & Plan Note (Signed)
Multiple elevated BP's during last few visits. - Start Amlodipine 5mg  QD - follow up in 2 weeks for BP check - stable kidney function based on last CMP in Dec 2021

## 2020-11-06 NOTE — Assessment & Plan Note (Signed)
In setting of RA.  A1C today. Hep B and C screening today given no prior screening and long term immunosuppression

## 2020-11-06 NOTE — Assessment & Plan Note (Signed)
Chronic.  - continue Lipitor 40mg  QD - lipid panel today

## 2020-11-06 NOTE — Assessment & Plan Note (Addendum)
Chronic. Well controlled with Pepcid 10mg  QD.  - Refill provided.

## 2020-11-14 ENCOUNTER — Ambulatory Visit: Payer: Medicaid Other | Admitting: Internal Medicine

## 2020-11-22 ENCOUNTER — Ambulatory Visit (INDEPENDENT_AMBULATORY_CARE_PROVIDER_SITE_OTHER): Payer: Medicaid Other

## 2020-11-22 ENCOUNTER — Other Ambulatory Visit: Payer: Self-pay

## 2020-11-22 VITALS — BP 132/84 | HR 108

## 2020-11-22 DIAGNOSIS — Z0130 Encounter for examination of blood pressure without abnormal findings: Secondary | ICD-10-CM | POA: Diagnosis not present

## 2020-11-22 DIAGNOSIS — Z23 Encounter for immunization: Secondary | ICD-10-CM

## 2020-11-22 NOTE — Progress Notes (Signed)
Patient presents to nurse clinic for covid booster and BP check. While being brought back to room, patient has complaints regarding left eye. Patient is requesting that provider send in rx for eye drop to help alleviate discomfort. Informed patient that nurse visit was scheduled for BP check and booster, and that she was not scheduled to see provider today.   Patient continues to report discomfort and photosensitivity in eye. Checked to see if there were any provider appointments for today. Unfortunately, clinic is completely booked until Monday. Advised patient that I could schedule her follow up appointment with PCP on Monday to further address eye concerns. Patient was agreeable to plan and scheduled with PCP on 1/31.   Patient asking if she can use OTC eye drops in the meantime to help alleviate some of the discomfort she is experiencing.     Spoke with Dr. McDiarmid regarding patient. Advised that patient could use OTC eye drops over the weekend.   Patient informed. Provided with UC/ ED precautions.   Veronda Prude, RN

## 2020-11-22 NOTE — Progress Notes (Signed)
   Covid-19 Vaccination Clinic  Name:  Kathy [name]    MRN: [id number] DOB: April 22, 1966  11/22/2020   Patient presents to nurse clinic for booster COVID vaccine. Administered in RD, site unremarkable, tolerated injection well.   [name] was observed post Covid-19 immunization for 30 minutes based on pre-vaccination screening without incident. She was provided with Vaccine Information Sheet and instruction to access the V-Safe system.   [name] was instructed to call 911 with any severe reactions post vaccine: Marland Kitchen Difficulty breathing  . Swelling of face and throat  . A fast heartbeat  . A bad rash all over body  . Dizziness and weakness    Provided patient with updated immunization record and card   Kathy Prude, RN

## 2020-11-22 NOTE — Progress Notes (Signed)
Patient here today for BP check.      Last BP was on 11/06/2020 and was 152/90.  BP today is 132/84 with a pulse of 108. Reviewed patient's previous vital signs. Patient has history of elevated HR at multiple previous visits. Informed Dr. McDiarmid, per protocol. No new orders at this time. Patient has scheduled follow up on Monday with Dr. Mauri Reading.     Checked BP in right arm with adult large cuff.    Symptoms present: none.   Reports medication compliance.    Routed note to PCP.      Veronda Prude, RN

## 2020-11-23 ENCOUNTER — Other Ambulatory Visit: Payer: Self-pay | Admitting: Family Medicine

## 2020-11-23 DIAGNOSIS — I10 Essential (primary) hypertension: Secondary | ICD-10-CM

## 2020-11-24 NOTE — Progress Notes (Deleted)
   Subjective:   Patient ID: Kathy [name]    DOB: 1966/01/03, 55 y.o. female   MRN: [id number]  Kathy [name] is a 55 y.o. female with a history of HTN, asthma, GERD, DJD, osteoarthritis, RA on chronic steroids, HLD, panic disorder here for Left eye pain.  Left Eye pain: Patient presented on 1/28 for COVID vaccine and BP check and endorsed acute left eye pain and photosensitivity. She has been treating with *** Today she notes *** Eye doctor? Last appointment?   HTN:    today. Currently on Amlodipine 5mg  QDEndorses compliance. Non-smoker/Current everyday smoker***. Denies any chest pain, SOB, vision changes, or headaches.   Review of Systems:  Per HPI.   Objective:   There were no vitals taken for this visit. Vitals and nursing note reviewed.  General: pleasant ***, sitting comfortably in exam chair, well nourished, well developed, in no acute distress with non-toxic appearance HEENT: normocephalic, atraumatic, moist mucous membranes, oropharynx clear without erythema or exudate, TM normal bilaterally  Neck: supple, non-tender without lymphadenopathy CV: regular rate and rhythm without murmurs, rubs, or gallops, no lower extremity edema, 2+ radial and pedal pulses bilaterally Lungs: clear to auscultation bilaterally with normal work of breathing on room air Resp: breathing comfortably on room air, speaking in full sentences Abdomen: soft, non-tender, non-distended, no masses or organomegaly palpable, normoactive bowel sounds Skin: warm, dry, no rashes or lesions Extremities: warm and well perfused, normal tone MSK: ROM grossly intact, strength intact, gait normal Neuro: Alert and oriented, speech normal  Assessment & Plan:   No problem-specific Assessment & Plan notes found for this encounter.  No orders of the defined types were placed in this encounter.  No orders of the defined types were placed in this encounter.   , DO PGY-3, [facility] 11/24/2020 9:57 AM

## 2020-11-25 ENCOUNTER — Ambulatory Visit: Payer: Medicaid Other | Admitting: Family Medicine

## 2020-11-25 NOTE — Telephone Encounter (Signed)
Will provide refill but please have patient schedule follow up appointment at earliest convenience for blood pressure recheck.

## 2020-12-02 NOTE — Progress Notes (Signed)
Office Visit Note  Patient: Kathy [name]             Date of Birth: 1966/10/07           MRN: [id number]             PCP: Danna Hefty, DO Referring: Danna Hefty, DO Visit Date: 12/03/2020  Subjective:   History of Present Illness: Kathy [name] is a 55 y.o. female here for follow up of rheumatoid arthritis previously seen by Dr. Gerilyn Nestle who she last saw in October with a flare of RA and taking $RemoveBe'15mg'AnsBTXESL$  of prednisone daily since that time as well as HCQ $Remo'200mg'qMqoI$  and Xeljanz $RemoveBef'11mg'PkBCtWADeB$  ER. Our initial visit December showed continued swelling in the right hand and wrist and elbow and very highly elevated ESR. Follow up today tenderness in the right extremity is improved but still slight swelling and now also right ankle is swollen. She prefers against having to take injectable medicine, we discussed change to a different oral JAK inhibitor as alternative option since such swelling remains despite moderate dose prednisone.   LAbs reviewed 09/2020 IgG 2,149 IgM 441 TP 8.5 TB neg ESR >130 CMP neg CBC - Hgb 11.5  06/2016 HCV neg HBV SAg neg  Medications MTX discontinued for nausea HCQ Tofacitinib Prednisone   You still have active rheumatoid arthritis on the current Xeljanz and while taking prednisone $RemoveBeforeDEI'15mg'FTlhyAdxSIColFfW$  daily. I believe we should consider switching to a different medication to see if we can get more symptom improvement and use less prednisone. At our next visit we will recheck how you are doing and can try switching to an alternative oral medication called Rinvoq or Upadacitinib.  Review of Systems  Constitutional: Negative for fatigue.  HENT: Negative for mouth sores, mouth dryness and nose dryness.   Eyes: Positive for dryness. Negative for pain and itching.  Respiratory: Negative for shortness of breath and difficulty breathing.   Cardiovascular: Negative for chest pain and palpitations.  Gastrointestinal: Negative for blood in stool, constipation and diarrhea.   Endocrine: Negative for increased urination.  Genitourinary: Negative for difficulty urinating.  Musculoskeletal: Positive for arthralgias, gait problem, joint pain, joint swelling, myalgias, morning stiffness, muscle tenderness and myalgias.  Skin: Negative for color change, rash and redness.  Allergic/Immunologic: Negative for susceptible to infections.  Neurological: Negative for dizziness, headaches, memory loss and weakness.  Hematological: Positive for bruising/bleeding tendency.  Psychiatric/Behavioral: Positive for sleep disturbance. Negative for confusion.    PMFS History:  Patient Active Problem List   Diagnosis Date Noted  . History of abdominal hysterectomy 11/05/2020  . High risk medication use 10/11/2020  . Current chronic use of systemic steroids 07/12/2020  . Essential hypertension 07/12/2020  . Hyperlipidemia 02/22/2020  . Gastroesophageal reflux disease without esophagitis 06/15/2019  . Asthma   . Panic disorder 07/23/2015  . 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 Rheumatoid arthritis ([place]) 09/15/2009  . DEGENERATIVE JOINT DISEASE, GENERALIZED 07/28/2007    Past Medical History:  Diagnosis Date  . Asthma    seasonal  . GERD (gastroesophageal reflux disease)   . Rheumatoid arteritis (HCC)     Family History  Problem Relation Age of Onset  . Asthma Mother   . Diabetes Mother   . Hypertension Mother   . Heart disease Mother   . Asthma Father   . Cancer Father        lung  . Lung cancer Sister   . Cancer Sister  breast at 30  . Cancer Brother        unknown   Past Surgical History:  Procedure Laterality Date  . ABDOMINAL HYSTERECTOMY    . TOTAL KNEE ARTHROPLASTY Right 06/18/2015   Procedure: RIGHT TOTAL KNEE ARTHROPLASTY;  Surgeon: Mcarthur Rossetti, MD;  Location: [place];  Service: Orthopedics;  Laterality: Right;  . TOTAL KNEE ARTHROPLASTY Left 05/11/2017   Procedure: LEFT TOTAL KNEE ARTHROPLASTY;  Surgeon: Mcarthur Rossetti, MD;  Location: [place];  Service: Orthopedics;  Laterality: Left;   Social History   Social History Narrative  . Not on file   Immunization History  Administered Date(s) Administered  . Influenza,inj,Quad PF,6+ Mos 09/19/2014, 07/23/2015, 08/04/2018, 07/08/2020  . Influenza,inj,Quad PF,6-35 Mos 11/16/2017  . PFIZER Comirnaty(Gray Top)Covid-19 Tri-Sucrose Vaccine 11/22/2020  . PFIZER(Purple Top)SARS-COV-2 Vaccination 02/29/2020, 03/26/2020  . Pneumococcal Polysaccharide-23 10/26/2002  . Tdap 07/03/2016, 08/04/2018     Objective: Vital Signs: BP 111/78 (BP Location: Left Arm, Patient Position: Sitting, Cuff Size: Large)   Pulse (!) 114   Resp 17   Ht $R'5\' 2"'iN$  (1.575 m)   Wt 212 lb 12.8 oz (96.5 kg)   BMI 38.92 kg/m    Physical Exam Constitutional:      Appearance: She is obese.  Eyes:     Conjunctiva/sclera: Conjunctivae normal.  Skin:    General: Skin is warm and dry.     Findings: No rash.  Neurological:     General: No focal deficit present.     Mental Status: She is alert.  Psychiatric:        Mood and Affect: Mood normal.     Musculoskeletal Exam:  Right elbow no swelling or tenderness, severely reduced range of motion flexion and extension, left elbow no swelling ROM good in flexion but reduced extension Reduced wrist flexion and extension ROM b/l Right hand 2-4 MCPs swelling, not tender, chronic subluxation chnges Left hand MCPs no overlying swelling or tenderness but also swan-neck deformities Z-shaped deformity of the thumbs bilaterally without tenderness or swelling Knees well-healed surgical scars full range of motion no swelling Right ankle is swollen and tender around lateral malleolus and reduced ROM especially inversion/eversion, left ankle normal Cocked up toe deformities of MTP joints bilaterally without tenderness or swelling  CDAI Exam: CDAI Score: 3.7  Patient Global: 4 mm; Provider Global: 3 mm Swollen: 4 ; Tender: 1  Joint Exam  12/03/2020      Right  Left  MCP 2  Swollen      MCP 3  Swollen      MCP 4  Swollen      Ankle  Swollen Tender        Investigation: No additional findings.  Imaging: No results found.  Recent Labs: Lab Results  Component Value Date   WBC 8.0 10/11/2020   HGB 11.5 (L) 10/11/2020   PLT 409 (H) 10/11/2020   NA 139 10/11/2020   K 3.7 10/11/2020   CL 103 10/11/2020   CO2 25 10/11/2020   GLUCOSE 94 10/11/2020   BUN 14 10/11/2020   CREATININE 0.56 10/11/2020   BILITOT 0.4 10/11/2020   ALKPHOS 118 (H) 02/20/2020   AST 16 10/11/2020   ALT 25 10/11/2020   PROT 8.2 (H) 10/11/2020   PROT 8.5 (H) 10/11/2020   ALBUMIN 4.4 02/20/2020   CALCIUM 9.5 10/11/2020   GFRAA 123 10/11/2020   QFTBGOLDPLUS NEGATIVE 10/11/2020    Speciality Comments: No specialty comments available.  Procedures:  No procedures performed Allergies: Methotrexate  derivatives, Peanut-containing drug products, Acyclovir and related, Aspirin, Eggs or egg-derived products, and Tomato   Assessment / Plan:     Visit Diagnoses: Rheumatoid arthritis involving multiple sites, unspecified whether rheumatoid factor present ([place]) - Plan: predniSONE (DELTASONE) 5 MG tablet, hydroxychloroquine (PLAQUENIL) 200 MG tablet, Upadacitinib ER 15 MG TB24, Lipid panel, Protein Electrophoresis, (serum)  RA low disease activity but still swelling and stiffness despite moderate dose prednisone. Previously not tolerating MTX and now inadequate response to xeljanz and HCQ. Plan to switch DMARD from xeljanz to rinvoq as she prefers staying with oral medications if possible. Will continue HCQ $RemoveBefo'200mg'EjAWKsTpiov$ . Decrease prednisone to $RemoveBefor'10mg'rvhKaoNaWocw$  needs to get to a lower dose to prevent long term harms, instructed her to call back if a bad flare occurs. Also had elevated IgG and IgM, probably 2/2 RA activity, will check SPEP.  High risk medication use - Plan: Lipid panel, Protein Electrophoresis, (serum)  Previous CBC, CMP, TB, hepatitis panel unremarkable  and appropriate for treatment. Will obtain new baseline lipid profile. Discussed risks and benefits of medication including infection, liver toxicity, cytopenias, cardiovascular risk.  Current chronic use of systemic steroids  Plan to start prednisone taper down by $Remov'5mg'xOAozL$  daily dose today, will reduce incrementally with ongoing RA activity also longer term use of steroids risk for insufficiency.  Orders: Orders Placed This Encounter  Procedures  . Lipid panel  . Protein Electrophoresis, (serum)   Meds ordered this encounter  Medications  . predniSONE (DELTASONE) 5 MG tablet    Sig: Take 2 tablets (10 mg total) by mouth daily with breakfast.    Dispense:  60 tablet    Refill:  2  . hydroxychloroquine (PLAQUENIL) 200 MG tablet    Sig: Take 1 tablet (200 mg total) by mouth daily.    Dispense:  90 tablet    Refill:  0  . Upadacitinib ER 15 MG TB24    Sig: Take 15 mg by mouth daily.    Dispense:  30 tablet    Refill:  2     Follow-Up Instructions: Return in about 5 weeks (around 01/06/2021), or RA f/u Rinvoq start.   Collier Salina, MD  Note - This record has been created using [employer].  Chart creation errors have been sought, but may not always  have been located. Such creation errors do not reflect on  the standard of medical care.

## 2020-12-03 ENCOUNTER — Telehealth: Payer: Self-pay | Admitting: Pharmacist

## 2020-12-03 ENCOUNTER — Ambulatory Visit: Payer: Medicaid Other | Admitting: Internal Medicine

## 2020-12-03 ENCOUNTER — Encounter: Payer: Self-pay | Admitting: Internal Medicine

## 2020-12-03 ENCOUNTER — Other Ambulatory Visit: Payer: Self-pay

## 2020-12-03 VITALS — BP 111/78 | HR 114 | Resp 17 | Ht 62.0 in | Wt 212.8 lb

## 2020-12-03 DIAGNOSIS — Z7952 Long term (current) use of systemic steroids: Secondary | ICD-10-CM | POA: Diagnosis not present

## 2020-12-03 DIAGNOSIS — Z79899 Other long term (current) drug therapy: Secondary | ICD-10-CM

## 2020-12-03 DIAGNOSIS — M069 Rheumatoid arthritis, unspecified: Secondary | ICD-10-CM | POA: Diagnosis not present

## 2020-12-03 MED ORDER — HYDROXYCHLOROQUINE SULFATE 200 MG PO TABS: 200.0000 mg | ORAL_TABLET | Freq: Every day | ORAL | 0 refills | Status: DC

## 2020-12-03 MED ORDER — UPADACITINIB ER 15 MG PO TB24: 15.0000 mg | ORAL_TABLET | Freq: Every day | ORAL | 2 refills | Status: DC

## 2020-12-03 MED ORDER — PREDNISONE 5 MG PO TABS: 10.0000 mg | ORAL_TABLET | Freq: Every day | ORAL | 2 refills | Status: DC

## 2020-12-03 NOTE — Telephone Encounter (Signed)
Received notification from [facility] regarding a prior authorization for [facility]. Authorization has been APPROVED from 12/03/20 to 12/03/21.   Authorization # [id number]  Called pharmacy and advised- Rx was transferred to [facility]

## 2020-12-03 NOTE — Progress Notes (Signed)
Pharmacy Note  Subjective: Patient presents today to [facility] for follow up office visit. Patient seen by the pharmacist for counseling on Rinvoq for rheumatoid arthritis..  Patient currently takes [place] with prednisone and hasn't had symptom improvement. Rinvoq will replace Xeljanz. Patient states she takes [place] once daily.   Objective:  CMP     Component Value Date/Time   NA 139 10/11/2020 1105   NA 139 02/20/2020 1655   K 3.7 10/11/2020 1105   CL 103 10/11/2020 1105   CO2 25 10/11/2020 1105   GLUCOSE 94 10/11/2020 1105   BUN 14 10/11/2020 1105   BUN 14 02/20/2020 1655   CREATININE 0.56 10/11/2020 1105   CALCIUM 9.5 10/11/2020 1105   PROT 8.2 (H) 10/11/2020 1105   PROT 8.5 (H) 10/11/2020 1105   PROT 8.5 02/20/2020 1655   ALBUMIN 4.4 02/20/2020 1655   AST 16 10/11/2020 1105   ALT 25 10/11/2020 1105   ALKPHOS 118 (H) 02/20/2020 1655    CBC    Component Value Date/Time   WBC 8.0 10/11/2020 1105   RBC 3.74 (L) 10/11/2020 1105   HGB 11.5 (L) 10/11/2020 1105   HGB 11.6 02/20/2020 1655   HCT 33.8 (L) 10/11/2020 1105   HCT 34.6 02/20/2020 1655   PLT 409 (H) 10/11/2020 1105   PLT 457 (H) 02/20/2020 1655   MCV 90.4 10/11/2020 1105   MCV 91 02/20/2020 1655   MCH 30.7 10/11/2020 1105   MCHC 34.0 10/11/2020 1105   RDW 13.3 10/11/2020 1105   RDW 12.6 02/20/2020 1655    Baseline Immunosuppressant Therapy Labs TB GOLD Quantiferon TB Gold Latest Ref Rng & Units 10/11/2020  Quantiferon TB Gold Plus NEGATIVE NEGATIVE   Hepatitis Panel   HIV Lab Results  Component Value Date   HIV Non Reactive 08/13/2017   Immunoglobulins Immunoglobulin Electrophoresis Latest Ref Rng & Units 10/11/2020  IgA  47 - 310 mg/dL 810  IgG 175 - 1,025 mg/dL 8,527(P)  IgM 50 - 824 mg/dL 235(T)   SPEP Serum Protein Electrophoresis Latest Ref Rng & Units 10/11/2020  Total Protein 6.1 - 8.1 g/dL 6.1(W)  Albumin 3.8 - 4.8 g/dL 4.3(X)  Alpha-1 0.2 - 0.3 g/dL 5.4(M)  Alpha-2  0.5 - 0.9 g/dL 0.8(Q)  Beta Globulin 0.4 - 0.6 g/dL 0.5  Beta 2 0.2 - 0.5 g/dL 0.5  Gamma Globulin 0.8 - 1.7 g/dL 2.0(H)   G6PD No results found for: G6PDH TPMT No results found for: TPMT   Lipid Panel Lab Results  Component Value Date   CHOL 186 02/20/2020   HDL 53 02/20/2020   LDLCALC 118 (H) 02/20/2020   TRIG 83 02/20/2020   CHOLHDL 3.5 02/20/2020     Does patient have history of diverticulitis?  No  Assessment/Plan:  Counseled patient that Rinvoq is a JAK inhibitor indicated for Rheumatoid Arthritis.  Counseled patient on purpose, proper use, and adverse effects of Rinvoq.    Reviewed the most common adverse effects including infection, diarrhea, headaches.  Also reviewed rare adverse effects such as bowel injury and the need to contact us if they develop stomach pain during treatment. Counseled on the increase risk of venous thrombosis. Reviewed with patient that there is the possibility of an increased risk of malignancy but it is not well understood if this increased risk is due to the medication or the disease state.  Instructed patient that medication should be held for infection and prior to surgery.  Advised patient to avoid live vaccines. Recommend annual influenza,  Pneumovax 23, Prevnar 13, and Shingrix as indicated.    Reviewed the importance of routine lab monitoring including lipid panel.  Lipid and serum protein electrophoresis drawn today. Standing orders placed. Provided patient with medication education material and answered all questions.  Patient consented to Rinvoq.  Will upload into patient's chart.  Will apply through patient's insurance and update when we receive a response.  Patient dose will be 15 mg daily.  Chesley Mires, PharmD, MPH Clinical Pharmacist (Rheumatology and Pulmonology)

## 2020-12-03 NOTE — Telephone Encounter (Signed)
Submitted a Prior Authorization request to [facility] for [facility] via Cover My Meds. Will update once we receive a response.   EKC:[id number] - PA Case ID: [id number]

## 2020-12-03 NOTE — Telephone Encounter (Signed)
Please start Rinvoq BIV.  Dose: 15mg  ER once daily  Diagnosis: M06.9 (Rheumatoid arthritis involving multiple sites, unspecified whether rheumatoid factor present   , PharmD, MPH Clinical Pharmacist (Rheumatology and Pulmonology)

## 2020-12-03 NOTE — Patient Instructions (Signed)
Decrease prednisone to 10mg  (2 tablets) daily with food. I recommend switching to a new arthritis medication rinvoq (upadacitinib) this will REPLACE your xeljanz medication as once daily pill.

## 2020-12-04 NOTE — Telephone Encounter (Signed)
Called [facility] to clarify patient's copay. Copay is $3 for 30 day supply.  Called patient to notify and discuss how to coordinate shipment. Unable to reach patient but left VM with [facility] pharmacy phone number [phone]) and advised that she will stop Harriette Ohara. Advised her to call pharmacy to schedule shipment and that the will collect copay and confirm shipping address with each shipment. Will f/u on Friday with patient/pharmacy if we don't receive return call.  Chesley Mires, PharmD, MPH Clinical Pharmacist (Rheumatology and Pulmonology)

## 2020-12-05 LAB — PROTEIN ELECTROPHORESIS, SERUM
Abnormal Protein Band1: 0.9 g/dL — ABNORMAL HIGH
Albumin ELP: 3.5 g/dL — ABNORMAL LOW (ref 3.8–4.8)
Alpha 1: 0.5 g/dL — ABNORMAL HIGH (ref 0.2–0.3)
Alpha 2: 1.2 g/dL — ABNORMAL HIGH (ref 0.5–0.9)
Beta 2: 0.5 g/dL (ref 0.2–0.5)
Beta Globulin: 0.5 g/dL (ref 0.4–0.6)
Gamma Globulin: 2 g/dL — ABNORMAL HIGH (ref 0.8–1.7)
Total Protein: 8.1 g/dL (ref 6.1–8.1)

## 2020-12-05 LAB — LIPID PANEL
Cholesterol: 134 mg/dL (ref <200)
HDL: 47 mg/dL — ABNORMAL LOW (ref >=50)
LDL Cholesterol (Calc): 71 mg/dL (calc)
Non-HDL Cholesterol (Calc): 87 mg/dL (calc) (ref <130)
Total CHOL/HDL Ratio: 2.9 (calc) (ref <5.0)
Triglycerides: 82 mg/dL (ref <150)

## 2020-12-08 NOTE — Progress Notes (Signed)
Cholesterol level look okay, these can become increased with the rinvoq medication so we will check again in follow up. We will need to see her back in about 4 weeks after starting the new medicine and decreasing the prednisone, no f/u visit currently scheduled.

## 2020-12-09 NOTE — Telephone Encounter (Addendum)
Called [facility] regarding Rinvoq status.  Patient unable to be contacted - attempted VM 12/05/20 to schedule for delivery on 12/09/20.  Targeted delivery date reset to 12/17/20 and pharmacy will reach out to her  Discussed with Alysia Penna, RT with Dr. Dimple Casey who has left VM with patient this morning regarding labs. If patient calls clinic back, we will provide [facility] pharmacy phone number [phone]) and advise to replace Harriette Ohara with Rinvoq  Chesley Mires, PharmD, MPH Clinical Pharmacist (Rheumatology and Pulmonology)  Addendum 12/11/20 - called [facility] for order status - still has not scheduled delivery. Pharmacy still has targeted delivery date for 12/17/20

## 2020-12-17 ENCOUNTER — Telehealth: Payer: Self-pay

## 2020-12-17 NOTE — Telephone Encounter (Signed)
[facility] left a voicemail regarding prescription they received for Rinvoq.  It looks like the patient was previously taking [place] and we are checking if the Rinvoq will be replacing the Kathy [name] or if the patient will be taking both medications at the same time.  Please call back at #[phone]

## 2020-12-17 NOTE — Telephone Encounter (Signed)
Returned call to [facility]. Spoke with RPh and advised that Rinvoq will replace Xeljanz.  Nothing further needed.  Chesley Mires, PharmD, MPH Clinical Pharmacist (Rheumatology and Pulmonology)

## 2020-12-18 ENCOUNTER — Encounter: Payer: Self-pay | Admitting: Radiology

## 2020-12-19 NOTE — Telephone Encounter (Signed)
Called [facility] to f/u on status of Rinvoq.  Pharmacy unable to reach patient. Order has been cancelled since they have attempted delivery 3 times.  Letter was sent to address on file by Alysia Penna, RT, with Dr. Dimple Casey requesting patient call clinic.  Chesley Mires, PharmD, MPH Clinical Pharmacist (Rheumatology and Pulmonology)

## 2020-12-25 ENCOUNTER — Other Ambulatory Visit: Payer: Self-pay | Admitting: Family Medicine

## 2020-12-25 DIAGNOSIS — M069 Rheumatoid arthritis, unspecified: Secondary | ICD-10-CM

## 2020-12-25 DIAGNOSIS — I10 Essential (primary) hypertension: Secondary | ICD-10-CM

## 2020-12-25 NOTE — Telephone Encounter (Signed)
Patient no-showed for last blood pressure recheck. Please have her schedule follow up appointment within next month. Will provide 1 month refill.

## 2020-12-26 NOTE — Telephone Encounter (Signed)
Called and left voicemail for patient to call back and schedule appointment.  

## 2021-01-06 NOTE — Progress Notes (Signed)
   Subjective:   Patient ID: Kathy [name]    DOB: 1966-06-22, 55 y.o. female   MRN: [id number]  Kathy [name] is a 55 y.o. female with a history of hypertension, asthma, GERD, rheumatoid arthritis, osteoarthritis, chronic steroids, hyperlipidemia here for medication refills.  HTN:  BP: (!) 130/97 today. Currently on Amlodipine 5 mg daily. Endorses compliance. Non-smoker. Denies any chest pain, SOB, vision changes, or headaches.   Lab Results  Component Value Date   CREATININE 0.56 10/11/2020   CREATININE 0.66 02/20/2020   CREATININE 0.72 05/12/2017   Health Maintenance: Health Maintenance Due  Topic  . Hepatitis C Screening   . COLON CANCER SCREENING ANNUAL FOBT   . MAMMOGRAM    No family history of breast or colon cancer to suggest early screening.  Review of Systems:  Per HPI.   Objective:   BP (!) 130/97   Pulse 100   Ht 5\' 2"  (1.575 m)   Wt 207 lb (93.9 kg)   SpO2 98%   BMI 37.86 kg/m  Vitals and nursing note reviewed.  General: pleasant older woman, sitting comfortably in exam chair, well nourished, well developed, in no acute distress with non-toxic appearance CV: regular rate and rhythm without murmurs, rubs, or gallops Lungs: clear to auscultation bilaterally with normal work of breathing on room air, speaking in full sentences Skin: warm, dry Extremities: warm and well perfused, evidence of rheumatoid arthritis in fingers Neuro: Alert and oriented, speech normal  Assessment & Plan:   Essential hypertension Chronic. Diastolic elevated on two measurements.  Discussed management options including monitoring with home blood pressures versus increase amlodipine to 10 mg QD.  Patient opted for increase in medication. -Increase amlodipine to 10 mg daily -Follow-up scheduled for 2 weeks for blood pressure check -Encouraged to continue to monitor at home and call if persistently low (<100/70)  Rheumatoid arthritis (HCC) Chronic.  Established  problem. Follows with rheumatology.  Current pain management medications include tramadol 50 mg as needed (#30) and Zanaflex.  PDMP reviewed and no red flags. Refills provided  Current chronic use of systemic steroids Encouraged to schedule DEXA scan   Family history of breast cancer Sister died of breast cancer at age 1. Unsure of diagnostic age. Pedigree tool score 3 (<8, thus no genetics referral indicated). Mammogram ordered.  Strongly encouraged to schedule earliest convenience  No orders of the defined types were placed in this encounter.  Meds ordered this encounter  Medications  . ferrous sulfate 325 (65 FE) MG tablet    Sig: Take 1 tablet (325 mg total) by mouth daily.    Dispense:  90 tablet    Refill:  1  . amLODipine (NORVASC) 10 MG tablet    Sig: Take 1 tablet (10 mg total) by mouth at bedtime.    Dispense:  90 tablet    Refill:  3  . tiZANidine (ZANAFLEX) 4 MG tablet    Sig: TAKE 1 TABLET BY MOUTH EVERY 8 HOURS AS NEEDED FOR MUSCLE SPASM    Dispense:  30 tablet    Refill:  2  . traMADol (ULTRAM) 50 MG tablet    Sig: Take 1 tablet (50 mg total) by mouth as needed.    Dispense:  30 tablet    Refill:  0     67, DO PGY-3, [facility] 01/08/2021 11:49 AM

## 2021-01-08 ENCOUNTER — Other Ambulatory Visit: Payer: Self-pay

## 2021-01-08 ENCOUNTER — Encounter: Payer: Self-pay | Admitting: Family Medicine

## 2021-01-08 ENCOUNTER — Ambulatory Visit (INDEPENDENT_AMBULATORY_CARE_PROVIDER_SITE_OTHER): Payer: Medicaid Other | Admitting: Family Medicine

## 2021-01-08 ENCOUNTER — Other Ambulatory Visit: Payer: Self-pay | Admitting: Family Medicine

## 2021-01-08 VITALS — BP 130/97 | HR 100 | Ht 62.0 in | Wt 207.0 lb

## 2021-01-08 DIAGNOSIS — I10 Essential (primary) hypertension: Secondary | ICD-10-CM

## 2021-01-08 DIAGNOSIS — E782 Mixed hyperlipidemia: Secondary | ICD-10-CM | POA: Diagnosis not present

## 2021-01-08 DIAGNOSIS — Z131 Encounter for screening for diabetes mellitus: Secondary | ICD-10-CM | POA: Diagnosis not present

## 2021-01-08 DIAGNOSIS — Z7952 Long term (current) use of systemic steroids: Secondary | ICD-10-CM

## 2021-01-08 DIAGNOSIS — D649 Anemia, unspecified: Secondary | ICD-10-CM | POA: Diagnosis not present

## 2021-01-08 DIAGNOSIS — Z803 Family history of malignant neoplasm of breast: Secondary | ICD-10-CM | POA: Diagnosis not present

## 2021-01-08 DIAGNOSIS — Z1159 Encounter for screening for other viral diseases: Secondary | ICD-10-CM | POA: Diagnosis not present

## 2021-01-08 DIAGNOSIS — M069 Rheumatoid arthritis, unspecified: Secondary | ICD-10-CM

## 2021-01-08 LAB — POCT GLYCOSYLATED HEMOGLOBIN (HGB A1C): Hemoglobin A1C: 5.4 % (ref 4.0–5.6)

## 2021-01-08 MED ORDER — FERROUS SULFATE 325 (65 FE) MG PO TABS: 325.0000 mg | ORAL_TABLET | Freq: Every day | ORAL | 1 refills | Status: DC

## 2021-01-08 MED ORDER — TRAMADOL HCL 50 MG PO TABS: 50.0000 mg | ORAL_TABLET | ORAL | 0 refills | Status: DC | PRN

## 2021-01-08 MED ORDER — AMLODIPINE BESYLATE 10 MG PO TABS
10.0000 mg | ORAL_TABLET | Freq: Every day | ORAL | 3 refills | Status: DC
Start: 2021-01-08 — End: 2022-03-04

## 2021-01-08 MED ORDER — TIZANIDINE HCL 4 MG PO TABS
ORAL_TABLET | ORAL | 2 refills | Status: DC
Start: 2021-01-08 — End: 2021-02-18

## 2021-01-08 NOTE — Assessment & Plan Note (Signed)
Encouraged to schedule DEXA scan

## 2021-01-08 NOTE — Assessment & Plan Note (Signed)
Sister died of breast cancer at age 55. Unsure of diagnostic age. Pedigree tool score 3 (<8, thus no genetics referral indicated). Mammogram ordered.  Strongly encouraged to schedule earliest convenience

## 2021-01-08 NOTE — Assessment & Plan Note (Signed)
Chronic.  Established problem. Follows with rheumatology.  Current pain management medications include tramadol 50 mg as needed (#30) and Zanaflex.  PDMP reviewed and no red flags. Refills provided

## 2021-01-08 NOTE — Patient Instructions (Signed)
It was a pleasure to see you today!  Thank you for choosing [facility] for your primary care.   Our plans for today were:  Increase your Amlodipine to 10mg  daily.  Follow up on 01/22/21 for blood pressure recheck  Stop taking the Lasix  To keep you healthy, please keep in mind the following health maintenance items that you are due for:   1. Bone density - please call and schedule 2.  Mammogram - please call to schedule   We are checking some labs today, I will call you if they are abnormal will send you a MyChart message or a letter if they are normal.  If you do not hear about your labs in the next 2 weeks please let 01/24/21 know.  BRING ALL OF YOUR MEDICATIONS WITH YOU TO EVERY VISIT   You should return to our clinic in 2 weeks for blood pressure check. Follow up in June.   Best Wishes,   July, DO

## 2021-01-08 NOTE — Assessment & Plan Note (Addendum)
Chronic. Diastolic elevated on two measurements.  Discussed management options including monitoring with home blood pressures versus increase amlodipine to 10 mg QD.  Patient opted for increase in medication. -Increase amlodipine to 10 mg daily -Follow-up scheduled for 2 weeks for blood pressure check -Encouraged to continue to monitor at home and call if persistently low (<100/70)

## 2021-01-09 ENCOUNTER — Telehealth: Payer: Self-pay | Admitting: Family Medicine

## 2021-01-09 ENCOUNTER — Encounter: Payer: Self-pay | Admitting: Family Medicine

## 2021-01-09 LAB — COMPREHENSIVE METABOLIC PANEL
ALT: 12 IU/L (ref 0–32)
AST: 12 IU/L (ref 0–40)
Albumin/Globulin Ratio: 1.1 — ABNORMAL LOW (ref 1.2–2.2)
Albumin: 4 g/dL (ref 3.8–4.9)
Alkaline Phosphatase: 75 IU/L (ref 44–121)
BUN/Creatinine Ratio: 17 (ref 9–23)
BUN: 11 mg/dL (ref 6–24)
Bilirubin Total: 0.3 mg/dL (ref 0.0–1.2)
CO2: 25 mmol/L (ref 20–29)
Calcium: 9.3 mg/dL (ref 8.7–10.2)
Chloride: 102 mmol/L (ref 96–106)
Creatinine, Ser: 0.63 mg/dL (ref 0.57–1.00)
Globulin, Total: 3.8 g/dL (ref 1.5–4.5)
Glucose: 102 mg/dL — ABNORMAL HIGH (ref 65–99)
Potassium: 4.2 mmol/L (ref 3.5–5.2)
Sodium: 141 mmol/L (ref 134–144)
Total Protein: 7.8 g/dL (ref 6.0–8.5)
eGFR: 105 mL/min/{1.73_m2} (ref >59)

## 2021-01-09 LAB — CBC
Hematocrit: 36.4 % (ref 34.0–46.6)
Hemoglobin: 11.9 g/dL (ref 11.1–15.9)
MCH: 29.8 pg (ref 26.6–33.0)
MCHC: 32.7 g/dL (ref 31.5–35.7)
MCV: 91 fL (ref 79–97)
Platelets: 472 10*3/uL — ABNORMAL HIGH (ref 150–450)
RBC: 3.99 x10E6/uL (ref 3.77–5.28)
RDW: 13.1 % (ref 11.7–15.4)
WBC: 7.9 10*3/uL (ref 3.4–10.8)

## 2021-01-09 LAB — LIPID PANEL
Chol/HDL Ratio: 3 ratio (ref 0.0–4.4)
Cholesterol, Total: 145 mg/dL (ref 100–199)
HDL: 48 mg/dL (ref >39)
LDL Chol Calc (NIH): 85 mg/dL (ref 0–99)
Triglycerides: 55 mg/dL (ref 0–149)
VLDL Cholesterol Cal: 12 mg/dL (ref 5–40)

## 2021-01-09 LAB — HEPATITIS B SURFACE ANTIGEN: Hepatitis B Surface Ag: NEGATIVE

## 2021-01-09 LAB — HEPATITIS C ANTIBODY: Hep C Virus Ab: 0.2 s/co ratio (ref 0.0–0.9)

## 2021-01-09 NOTE — Telephone Encounter (Signed)
Attempted to contact patient to discuss long term use of Iron supplement. After further review, her Hemoglobin has been normal for some time. I would recommend we discontinue this at this time. We will recheck ferritin level and CBC in 3 months at follow up visit. If declining then we will need to evaluate for why she is becoming anemia and discuss obtaining colon cancer evaluation.   LMoVM for patient to call back. If she calls back please inform her of the above. If she has further questions I will be happy to talk with her.

## 2021-01-22 ENCOUNTER — Ambulatory Visit: Payer: Medicaid Other

## 2021-01-28 ENCOUNTER — Other Ambulatory Visit: Payer: Self-pay | Admitting: Internal Medicine

## 2021-01-28 ENCOUNTER — Other Ambulatory Visit: Payer: Self-pay | Admitting: Family Medicine

## 2021-01-28 DIAGNOSIS — M069 Rheumatoid arthritis, unspecified: Secondary | ICD-10-CM

## 2021-01-28 DIAGNOSIS — F410 Panic disorder [episodic paroxysmal anxiety] without agoraphobia: Secondary | ICD-10-CM

## 2021-01-28 MED ORDER — UPADACITINIB ER 15 MG PO TB24
15.0000 mg | ORAL_TABLET | Freq: Every day | ORAL | 2 refills | Status: DC
Start: 2021-01-28 — End: 2021-02-27

## 2021-01-28 NOTE — Telephone Encounter (Signed)
Patient requesting are fill on Rinvoq sent to [facility] on [address].

## 2021-01-28 NOTE — Telephone Encounter (Signed)
Next Visit: no f/u scheduled  Last Visit: 12/03/2020  Last Fill: 12/03/2020  DX: Rheumatoid arthritis involving multiple sites, unspecified whether rheumatoid factor present  Current Dose per office note 12/03/2020: Take 15 mg by mouth daily  Labs: CBC, CMP- 01/08/2021  TB Gold: 10/11/2021- Negative  Okay to refill Rinvoq?

## 2021-01-28 NOTE — Telephone Encounter (Signed)
She needs a follow up clinic appointment scheduled some time in the next 4 weeks since we changed her medication in February. Labs look good from March so okay to refill for now.

## 2021-01-29 NOTE — Telephone Encounter (Signed)
Attempted to contact patient- she is very difficult to get in touch with from previous encounters. LVM advising patient she will need to call the office / schedule a follow-up visit within 4 weeks.

## 2021-02-04 ENCOUNTER — Other Ambulatory Visit: Payer: Self-pay

## 2021-02-04 DIAGNOSIS — J4520 Mild intermittent asthma, uncomplicated: Secondary | ICD-10-CM

## 2021-02-04 MED ORDER — ALBUTEROL SULFATE HFA 108 (90 BASE) MCG/ACT IN AERS
INHALATION_SPRAY | RESPIRATORY_TRACT | 1 refills | Status: DC
Start: 2021-02-04 — End: 2021-02-18

## 2021-02-16 NOTE — Progress Notes (Signed)
Subjective:   Patient ID: ALANNAH AVERHART    DOB: 08/19/1966, 55 y.o. female   MRN: [id number]  MOOREA BOISSONNEAULT is a 55 y.o. female with a history of HTN, asthma, GERD, DJD, osteoarthritis, RA, chronic use of systemic steroids, family history of BC, h/o abdominal hysterectomy, HLD, panic disorder here for iron deficiency evalaution  HPI: Patient has been on iron supplementation for many years. Last Hgb 11.9 with MCV 91 in 12/2020. Last iron studies notable for Iron 48, TIBC 299, Ferritin 76, Iron sat 16 in 2018.  She has never gotten colonoscopy. Last FOBT negative in 2015. Denies hematuria, hematochezia, melena, or AUB. Notes that she has been out of the iron supplement for 3 months  Per chart review, she was diagnosed with Anemia of chronic disease in 2015 likely from chronic RA on immunosuppressants. Her FOBT was negative at that time. Folate level at that time was normal. She has never had colonoscopy.  Review of Systems:  Per HPI.   Objective:   BP 122/84   Pulse (!) 112   Ht 5\' 2"  (1.575 m)   Wt 213 lb (96.6 kg)   SpO2 96%   BMI 38.96 kg/m  Vitals and nursing note reviewed.  General: pleasant older female, sitting comfortably in exam chair, well nourished, well developed, in no acute distress with non-toxic appearance Resp: breathing comfortably on room air, speaking in full sentences Skin: warm, dry Extremities: evidence of joint disease with finger and toe deformities from chronic RA  Neuro: Alert and oriented, speech normal  Assessment & Plan:   Anemia Per chart review, has history of anemia of chronic disease from chronic RA on immunosuppressants. Has never had colonoscopy. Denies active bleed. POC Hgb 12.5 today. Last Hgb 11.9 with normal MCV in March 2022. She had negative FOBT in 2015. Iron studies were normal in 2018. Has been off of iron supplement x 3 months. - Ferritin today  - strongly encouraged colonoscopy. Patient refused. She was agreeable to referral if  abnormal ferritin. - FOBT for annual screening. Patient was informed that this screening is no the most sensitive way to screen for colon cancer. Patient noted understanding and agreement with plan. - Recommend repeat Ferritin in 3 months. If declining she should have further work up for cause including colonoscopy  Asthma Albuterol inhaler refilled.  Rheumatoid arthritis (HCC) Chronic, improved since starting newer immunologics. Now only on Prednisone 5mg  win addition to Plaquinel, Upadacitinib.  Chronic pain managed with Tramadol 50mg  daily as needed and Tizanidine 4mg  PRN.  Refills provided. PDMP reviewed and no red flags. Patient again encouraged to have DEXA scan completed due to chronic corticosteroid use  Orders Placed This Encounter  Procedures  . Fecal occult blood, imunochemical    Standing Status:   Future    Standing Expiration Date:   02/18/2022  . Ferritin    Standing Status:   Future    Standing Expiration Date:   02/18/2022  . POCT hemoglobin    Associate with Z13.0   Meds ordered this encounter  Medications  . albuterol (PROVENTIL HFA) 108 (90 Base) MCG/ACT inhaler    Sig: INHALE 1 TO 2 PUFFS INTO THE LUNGS EVERY6 HOURS AS NEEDED FOR WHEEZING OR SHORTNESS OF BREATH    Dispense:  6.7 g    Refill:  1  . traMADol (ULTRAM) 50 MG tablet    Sig: Take 1 tablet (50 mg total) by mouth as needed.    Dispense:  30 tablet  Refill:  0  . tiZANidine (ZANAFLEX) 4 MG tablet    Sig: TAKE 1 TABLET BY MOUTH EVERY 8 HOURS AS NEEDED FOR MUSCLE SPASM    Dispense:  30 tablet    Refill:  2     Orpah Cobb, DO PGY-3, [facility] 02/18/2021 5:37 PM

## 2021-02-18 ENCOUNTER — Telehealth: Payer: Self-pay | Admitting: *Deleted

## 2021-02-18 ENCOUNTER — Encounter: Payer: Self-pay | Admitting: Family Medicine

## 2021-02-18 ENCOUNTER — Other Ambulatory Visit: Payer: Self-pay

## 2021-02-18 ENCOUNTER — Ambulatory Visit (INDEPENDENT_AMBULATORY_CARE_PROVIDER_SITE_OTHER): Payer: Medicaid Other | Admitting: Family Medicine

## 2021-02-18 VITALS — BP 122/84 | HR 112 | Ht 62.0 in | Wt 213.0 lb

## 2021-02-18 DIAGNOSIS — M069 Rheumatoid arthritis, unspecified: Secondary | ICD-10-CM

## 2021-02-18 DIAGNOSIS — J4520 Mild intermittent asthma, uncomplicated: Secondary | ICD-10-CM | POA: Diagnosis not present

## 2021-02-18 DIAGNOSIS — D649 Anemia, unspecified: Secondary | ICD-10-CM | POA: Diagnosis not present

## 2021-02-18 DIAGNOSIS — Z1211 Encounter for screening for malignant neoplasm of colon: Secondary | ICD-10-CM

## 2021-02-18 LAB — POCT HEMOGLOBIN: Hemoglobin: 12.5 g/dL (ref 11–14.6)

## 2021-02-18 MED ORDER — TRAMADOL HCL 50 MG PO TABS: 50.0000 mg | ORAL_TABLET | ORAL | 0 refills | Status: AC | PRN

## 2021-02-18 MED ORDER — TIZANIDINE HCL 4 MG PO TABS: ORAL_TABLET | ORAL | 2 refills | Status: DC

## 2021-02-18 MED ORDER — ALBUTEROL SULFATE HFA 108 (90 BASE) MCG/ACT IN AERS: INHALATION_SPRAY | RESPIRATORY_TRACT | 1 refills | Status: DC

## 2021-02-18 NOTE — Assessment & Plan Note (Addendum)
Chronic, improved since starting newer immunologics. Now only on Prednisone 5mg  win addition to Plaquinel, Upadacitinib.  Chronic pain managed with Tramadol 50mg  daily as needed and Tizanidine 4mg  PRN.  Refills provided. PDMP reviewed and no red flags. Patient again encouraged to have DEXA scan completed due to chronic corticosteroid use

## 2021-02-18 NOTE — Telephone Encounter (Signed)
FYI-Angie from lab said that this patient left before labs were drawn, but that it was already released in epic which makes it look like it is collected. Shaianne Nucci Zimmerman Rumple, CMA

## 2021-02-18 NOTE — Assessment & Plan Note (Signed)
Albuterol inhaler refilled 

## 2021-02-18 NOTE — Assessment & Plan Note (Addendum)
Per chart review, has history of anemia of chronic disease from chronic RA on immunosuppressants. Has never had colonoscopy. Denies active bleed. POC Hgb 12.5 today. Last Hgb 11.9 with normal MCV in March 2022. She had negative FOBT in 2015. Iron studies were normal in 2018. Has been off of iron supplement x 3 months. - Ferritin today  - strongly encouraged colonoscopy. Patient refused. She was agreeable to referral if abnormal ferritin. - FOBT for annual screening. Patient was informed that this screening is no the most sensitive way to screen for colon cancer. Patient noted understanding and agreement with plan. - Recommend repeat Ferritin in 3 months. If declining she should have further work up for cause including colonoscopy

## 2021-02-18 NOTE — Patient Instructions (Signed)
I have called in refills to your prescriptions Please stop taking the iron supplement I am checking your iron level today I recommend follow up in 3 months for recheck of iron

## 2021-02-19 ENCOUNTER — Telehealth: Payer: Self-pay | Admitting: *Deleted

## 2021-02-19 NOTE — Telephone Encounter (Signed)
Pt scheduled for lab and informed. Teruko Joswick Bruna Potter, CMA

## 2021-02-19 NOTE — Telephone Encounter (Signed)
-----   Message from [name], [place] sent at 02/18/2021  5:21 PM EDT ----- Can you reschedule this patient for labs. She left prior to getting her labs completed during her visit today.  Future order placed.

## 2021-02-24 ENCOUNTER — Other Ambulatory Visit: Payer: Medicaid Other

## 2021-02-24 ENCOUNTER — Other Ambulatory Visit: Payer: Self-pay

## 2021-02-24 DIAGNOSIS — D649 Anemia, unspecified: Secondary | ICD-10-CM | POA: Diagnosis not present

## 2021-02-24 DIAGNOSIS — Z1211 Encounter for screening for malignant neoplasm of colon: Secondary | ICD-10-CM

## 2021-02-25 LAB — FERRITIN: Ferritin: 327 ng/mL — ABNORMAL HIGH (ref 15–150)

## 2021-02-27 ENCOUNTER — Other Ambulatory Visit: Payer: Self-pay

## 2021-02-27 DIAGNOSIS — M069 Rheumatoid arthritis, unspecified: Secondary | ICD-10-CM

## 2021-02-27 MED ORDER — UPADACITINIB ER 15 MG PO TB24
15.0000 mg | ORAL_TABLET | Freq: Every day | ORAL | 0 refills | Status: DC
Start: 2021-02-27 — End: 2021-04-16

## 2021-02-27 NOTE — Telephone Encounter (Signed)
Next Visit: 03/28/2021  Last Visit: 12/03/2020  Last Fill: 01/28/2021  DX: Rheumatoid arthritis  Current Dose per office note 12/03/2020: Rinvoq 15 mg daily  Labs: 01/08/21- CMP and CBC  Okay to refill Rinvoq?

## 2021-02-27 NOTE — Telephone Encounter (Signed)
Patient called requesting prescription refill of the medication that Dr. Dimple Casey prescribed for her RA.  Patient states she threw the bottle away and cannot remember the name of the medication.  Please advise.

## 2021-03-22 ENCOUNTER — Other Ambulatory Visit: Payer: Self-pay | Admitting: Internal Medicine

## 2021-03-22 DIAGNOSIS — M069 Rheumatoid arthritis, unspecified: Secondary | ICD-10-CM

## 2021-03-28 ENCOUNTER — Other Ambulatory Visit: Payer: Self-pay

## 2021-03-28 ENCOUNTER — Encounter (HOSPITAL_COMMUNITY): Payer: Self-pay | Admitting: Emergency Medicine

## 2021-03-28 ENCOUNTER — Ambulatory Visit: Payer: Medicaid Other | Admitting: Internal Medicine

## 2021-03-28 ENCOUNTER — Ambulatory Visit (HOSPITAL_COMMUNITY)
Admission: EM | Admit: 2021-03-28 | Discharge: 2021-03-28 | Disposition: A | Discharge status: Home / Self Care | Payer: Medicaid Other | Attending: Physician Assistant | Admitting: Physician Assistant

## 2021-03-28 DIAGNOSIS — J329 Chronic sinusitis, unspecified: Secondary | ICD-10-CM | POA: Diagnosis not present

## 2021-03-28 DIAGNOSIS — R059 Cough, unspecified: Secondary | ICD-10-CM

## 2021-03-28 DIAGNOSIS — J40 Bronchitis, not specified as acute or chronic: Secondary | ICD-10-CM | POA: Diagnosis not present

## 2021-03-28 DIAGNOSIS — J9801 Acute bronchospasm: Secondary | ICD-10-CM

## 2021-03-28 DIAGNOSIS — J4520 Mild intermittent asthma, uncomplicated: Secondary | ICD-10-CM | POA: Diagnosis not present

## 2021-03-28 MED ORDER — DOXYCYCLINE HYCLATE 100 MG PO CAPS: 100.0000 mg | ORAL_CAPSULE | Freq: Two times a day (BID) | ORAL | 0 refills | Status: DC

## 2021-03-28 MED ORDER — PREDNISONE 10 MG (21) PO TBPK: ORAL_TABLET | ORAL | 0 refills | Status: DC

## 2021-03-28 NOTE — Discharge Instructions (Addendum)
I have called in doxycycline to cover for an infection.  Please take this twice a day for 10 days.  You should avoid the sun while taking this medication.  We are going to do a prednisone taper.  When you finish the taper please return to your 5 mg of prednisone daily to treat RA.  I recommend you follow-up with your rheumatologist and inform them of the prednisone taper in case you need to make additional adjustments.  Continue using albuterol as needed.  If your symptoms are not improving with current medication regimen please return for reevaluation.  If anything worsens please go to the hospital.  Follow-up with PCP within 1 week.

## 2021-03-28 NOTE — ED Triage Notes (Signed)
Pt presents with cough, runny nose, and wheezing xs 1 week. States using inhaler more than normal.

## 2021-03-28 NOTE — ED Provider Notes (Signed)
[facility]    CSN: [id number] Arrival date & time: 03/28/21  1126      History   Chief Complaint Chief Complaint  Patient presents with  . Cough  . Wheezing  . Nasal Congestion    HPI Kathy [name] is a 55 y.o. female.   Patient presents today with a weeklong history of cough.  Reports associated shortness of breath, wheezing, chest tightness, nasal congestion.  Denies any fever, nausea, vomiting, chest pain.  She denies any known sick contacts.  She has tried an over-the-counter cough medication but she cannot remember the name of this.  She has not tried additional over-the-counter medication for symptom management.  She denies any recent antibiotic use.  She does have a history of asthma and has been using albuterol more frequently since symptom onset.  Denies previous hospitalization for asthma.  She is currently prescribed prednisone 5 mg to manage RA but is not currently taking any higher dose to manage asthma symptoms at this time.  She is up-to-date on influenza and COVID-19 vaccinations.  No additional complaints or concerns today.     Past Medical History:  Diagnosis Date  . Asthma    seasonal  . GERD (gastroesophageal reflux disease)   . Rheumatoid arteritis [facility])     Patient Active Problem List   Diagnosis Date Noted  . Family history of breast cancer 01/08/2021  . History of abdominal hysterectomy 11/05/2020  . High risk medication use 10/11/2020  . Current chronic use of systemic steroids 07/12/2020  . Essential hypertension 07/12/2020  . Hyperlipidemia 02/22/2020  . Gastroesophageal reflux disease without esophagitis 06/15/2019  . Asthma   . Panic disorder 07/23/2015  . 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 Anemia 09/18/2014  . Rheumatoid arthritis (HCC) 09/15/2009  . DEGENERATIVE JOINT DISEASE, GENERALIZED 07/28/2007    Past Surgical History:  Procedure Laterality Date  . ABDOMINAL HYSTERECTOMY    . TOTAL KNEE  ARTHROPLASTY Right 06/18/2015   Procedure: RIGHT TOTAL KNEE ARTHROPLASTY;  Surgeon: Kathryne Hitch, MD;  Location: [facility] OR;  Service: Orthopedics;  Laterality: Right;  . TOTAL KNEE ARTHROPLASTY Left 05/11/2017   Procedure: LEFT TOTAL KNEE ARTHROPLASTY;  Surgeon: Kathryne Hitch, MD;  Location: [facility] OR;  Service: Orthopedics;  Laterality: Left;    OB History   No obstetric history on file.      Home Medications    Prior to Admission medications   Medication Sig Start Date End Date Taking? Authorizing Provider  doxycycline (VIBRAMYCIN) 100 MG capsule Take 1 capsule (100 mg total) by mouth 2 (two) times daily. 03/28/21  Yes Lacreasha Hinds, Noberto Retort, PA-C  predniSONE (STERAPRED UNI-PAK 21 TAB) 10 MG (21) TBPK tablet As directed 03/28/21  Yes Jamison Soward K, PA-C  albuterol (PROVENTIL HFA) 108 (90 Base) MCG/ACT inhaler INHALE 1 TO 2 PUFFS INTO THE LUNGS EVERY6 HOURS AS NEEDED FOR WHEEZING OR SHORTNESS OF BREATH 02/18/21   Mullis, Kiersten P, DO  amLODipine (NORVASC) 10 MG tablet Take 1 tablet (10 mg total) by mouth at bedtime. 01/08/21   Mullis, Kiersten P, DO  atorvastatin (LIPITOR) 40 MG tablet TAKE 1 TABLET BY MOUTH EVERY DAY 10/28/20   Mullis, Kiersten P, DO  citalopram (CELEXA) 40 MG tablet TAKE 1 TABLET BY MOUTH EVERY DAY 01/31/21   Mullis, Kiersten P, DO  diclofenac Sodium (VOLTAREN) 1 % GEL Apply 2g 4 times daily to affected area as needed for pain. 07/08/20   Mullis, Kiersten P, DO  famotidine ([employer] ACID CONTROLLER)  10 MG tablet Take 1 tablet (10 mg total) by mouth 2 (two) times daily. 11/06/20   Mullis, Kiersten P, DO  fluticasone (FLONASE) 50 MCG/ACT nasal spray Place 2 sprays into both nostrils daily. 07/01/18   Arlyce Harman, DO  hydroxychloroquine (PLAQUENIL) 200 MG tablet Take 1 tablet (200 mg total) by mouth daily. 12/03/20   Rice, Jamesetta Orleans, MD  predniSONE (DELTASONE) 5 MG tablet TAKE 2 TABLETS BY MOUTH DAILY WITH BREAKFAST. 03/24/21   Rice, Jamesetta Orleans, MD  SENNA LAX 8.6 MG tablet TAKE  ONE TABLET BY MOUTH EVERY DAY AS NEEDED FOR MILD CONSTIPATION 03/25/17   Joanna Puff, MD  tiZANidine (ZANAFLEX) 4 MG tablet TAKE 1 TABLET BY MOUTH EVERY 8 HOURS AS NEEDED FOR MUSCLE SPASM 02/18/21   Mullis, Kiersten P, DO  traMADol (ULTRAM) 50 MG tablet Take 1 tablet (50 mg total) by mouth as needed. 02/18/21   Mullis, Kiersten P, DO  Upadacitinib ER 15 MG TB24 Take 15 mg by mouth daily. 02/27/21   Rice, Jamesetta Orleans, MD  valACYclovir (VALTREX) 1000 MG tablet Take 1,000 mg by mouth as needed.    [provider]    Family History Family History  Problem Relation Age of Onset  . Asthma Mother   . Diabetes Mother   . Hypertension Mother   . Heart disease Mother   . Asthma Father   . Cancer Father        lung  . Lung cancer Sister   . Cancer Sister        breast at 57  . Cancer Brother        unknown    Social History Social History   Tobacco Use  . Smoking status: Never Smoker  . Smokeless tobacco: Never Used  Vaping Use  . Vaping Use: Never used  Substance Use Topics  . Alcohol use: No  . Drug use: No     Allergies   Methotrexate derivatives, Peanut-containing drug products, Acyclovir and related, Aspirin, Eggs or egg-derived products, and Tomato   Review of Systems Review of Systems  Constitutional: Positive for activity change and fatigue. Negative for appetite change and fever.  HENT: Negative for congestion, sinus pressure, sneezing and sore throat.   Respiratory: Positive for cough, shortness of breath and wheezing. Negative for chest tightness.   Cardiovascular: Negative for chest pain.  Gastrointestinal: Negative for abdominal pain, diarrhea, nausea and vomiting.  Musculoskeletal: Negative for arthralgias and myalgias.  Neurological: Negative for dizziness, light-headedness and headaches.     Physical Exam Triage Vital Signs ED Triage Vitals  Enc Vitals Group     BP 03/28/21 1208 (!) 132/13     Pulse Rate 03/28/21 1208 94     Resp 03/28/21  1208 18     Temp 03/28/21 1208 99.1 F (37.3 C)     Temp Source 03/28/21 1208 Oral     SpO2 03/28/21 1208 95 %     Weight --      Height --      Head Circumference --      Peak Flow --      Pain Score 03/28/21 1206 0     Pain Loc --      Pain Edu? --      Excl. in GC? --    No data found.  Updated Vital Signs BP 137/89   Pulse 94   Temp 99.1 F (37.3 C) (Oral)   Resp 18   SpO2 95%   Visual Acuity  Right Eye Distance:   Left Eye Distance:   Bilateral Distance:    Right Eye Near:   Left Eye Near:    Bilateral Near:     Physical Exam Vitals reviewed.  Constitutional:      General: She is awake. She is not in acute distress.    Appearance: Normal appearance. She is not ill-appearing.     Comments: Very pleasant female appears stated age in no acute distress  HENT:     Head: Normocephalic and atraumatic.     Right Ear: Tympanic membrane, ear canal and external ear normal. Tympanic membrane is not erythematous or bulging.     Left Ear: Tympanic membrane, ear canal and external ear normal. Tympanic membrane is not erythematous or bulging.     Nose:     Right Sinus: No maxillary sinus tenderness or frontal sinus tenderness.     Left Sinus: No maxillary sinus tenderness or frontal sinus tenderness.     Mouth/Throat:     Pharynx: Uvula midline. No oropharyngeal exudate or posterior oropharyngeal erythema.  Cardiovascular:     Rate and Rhythm: Normal rate and regular rhythm.     Heart sounds: Normal heart sounds. No murmur heard.   Pulmonary:     Effort: Pulmonary effort is normal.     Breath sounds: Wheezing present. No rhonchi or rales.     Comments: Widespread wheezing throughout lung fields. Lymphadenopathy:     Head:     Right side of head: No submental, submandibular or tonsillar adenopathy.     Left side of head: No submental, submandibular or tonsillar adenopathy.     Cervical: No cervical adenopathy.  Psychiatric:        Behavior: Behavior is cooperative.       UC Treatments / Results  Labs (all labs ordered are listed, but only abnormal results are displayed) Labs Reviewed - No data to display  EKG   Radiology No results found.  Procedures Procedures (including critical care time)  Medications Ordered in UC Medications - No data to display  Initial Impression / Assessment and Plan / UC Course  I have reviewed the triage vital signs and the nursing notes.  Pertinent labs & imaging results that were available during my care of the patient were reviewed by me and considered in my medical decision making (see chart for details).     Patient started on doxycycline given prolonged and worsening symptoms.  No indication for influenza or COVID-19 testing given patient has been symptomatic for more than 5 days.  Recommended a prednisone taper given bronchospasms on exam.  Discussed that when she completes taper she will need to return to 5 mg daily dosing and recommended she follow-up with rheumatologist particularly given additional prednisone taper.  She was instructed not to take NSAIDs including aspirin, ibuprofen/Advil, naproxen/Aleve with this medication due to risk of GI bleeding.  Recommended she drink plenty of fluid.  Discussed alarm symptoms that warrant emergent evaluation.  Strict return precautions given to which patient expressed understanding.  Final Clinical Impressions(s) / UC Diagnoses   Final diagnoses:  Sinobronchitis  Cough  Bronchospasm  Mild intermittent asthma without complication     Discharge Instructions     I have called in doxycycline to cover for an infection.  Please take this twice a day for 10 days.  You should avoid the sun while taking this medication.  We are going to do a prednisone taper.  When you finish the taper please return to  your 5 mg of prednisone daily to treat RA.  I recommend you follow-up with your rheumatologist and inform them of the prednisone taper in case you need to make  additional adjustments.  Continue using albuterol as needed.  If your symptoms are not improving with current medication regimen please return for reevaluation.  If anything worsens please go to the hospital.  Follow-up with PCP within 1 week.    ED Prescriptions    Medication Sig Dispense Auth. Provider   predniSONE (STERAPRED UNI-PAK 21 TAB) 10 MG (21) TBPK tablet As directed 21 tablet Jshaun Abernathy K, PA-C   doxycycline (VIBRAMYCIN) 100 MG capsule Take 1 capsule (100 mg total) by mouth 2 (two) times daily. 20 capsule Emilee Market, Noberto Retort, PA-C     PDMP not reviewed this encounter.   Jeani Hawking, PA-C 03/28/21 1317

## 2021-04-15 ENCOUNTER — Other Ambulatory Visit: Payer: Self-pay | Admitting: Internal Medicine

## 2021-04-15 DIAGNOSIS — M069 Rheumatoid arthritis, unspecified: Secondary | ICD-10-CM

## 2021-04-16 ENCOUNTER — Ambulatory Visit: Payer: Medicaid Other | Admitting: Internal Medicine

## 2021-04-16 ENCOUNTER — Other Ambulatory Visit: Payer: Self-pay

## 2021-04-16 ENCOUNTER — Encounter: Payer: Self-pay | Admitting: Internal Medicine

## 2021-04-16 DIAGNOSIS — M069 Rheumatoid arthritis, unspecified: Secondary | ICD-10-CM

## 2021-04-16 MED ORDER — UPADACITINIB ER 15 MG PO TB24
15.0000 mg | ORAL_TABLET | Freq: Every day | ORAL | 0 refills | Status: DC
Start: 2021-04-16 — End: 2021-05-23

## 2021-04-16 NOTE — Progress Notes (Signed)
Office Visit Note  Patient: Kathy [name]             Date of Birth: 08/27/1966           MRN: [id number]             PCP: [name], DO Referring: [name], DO Visit Date: 04/16/2021   Subjective:  Follow-up (Patient feels as if symptoms are somewhat controlled. Patient is taking PLQ, Rinvoq, and Prednisone 5 mg daily. Patient is currently on antibiotic for recent bronchitis diagnosis. )   History of Present Illness: Kathy [name] is a 55 y.o. female here for follow up for seropositive RA on hydroxychloroquine 200 mg, rinvoq 15 mg, and prednisone 5 mg daily. She originally starting this medicine and had normal 1 month follow-up labs in March.  However she has been without this medicine reportedly around 2 months. Her last follow-up was missed due to she developed acute bronchitis symptoms so has been treated with a course of oral antibiotics and tapering steroid treatment and had to reschedule her initial follow-up.  As result she is off the Rinvoq for at least several weeks or up to 2 months.  She reports that it was working very well for her joints.  Her prednisone taper was also controlling her joint symptoms well although she is now taper down to a 5 mg daily dose.  Bronchitis symptoms are mostly improved though she continues to have hoarseness and sore throat.  LAbs reviewed 09/2020 IgG 2,149 IgM 441 TP 8.5 TB neg ESR >130 CMP neg CBC - Hgb 11.5   06/2016 HCV neg HBV SAg neg   Medications MTX discontinued for nausea HCQ Tofacitinib d/c incomplete responder Prednisone   Review of Systems  Constitutional:  Negative for fatigue.  HENT:  Negative for mouth sores, mouth dryness and nose dryness.   Eyes:  Negative for pain, itching, visual disturbance and dryness.  Respiratory:  Negative for cough, hemoptysis, shortness of breath and difficulty breathing.   Cardiovascular:  Negative for chest pain, palpitations and swelling in legs/feet.   Gastrointestinal:  Negative for abdominal pain, blood in stool, constipation and diarrhea.  Endocrine: Negative for increased urination.  Genitourinary:  Negative for painful urination.  Musculoskeletal:  Positive for joint pain and joint pain. Negative for joint swelling, myalgias, muscle weakness, morning stiffness, muscle tenderness and myalgias.  Skin:  Negative for color change, rash and redness.  Allergic/Immunologic: Negative for susceptible to infections.  Neurological:  Positive for headaches. Negative for dizziness, numbness, memory loss and weakness.  Hematological:  Negative for swollen glands.  Psychiatric/Behavioral:  Negative for confusion and sleep disturbance.    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   Anemia 09/18/2014   Rheumatoid arthritis (HCC) 09/15/2009   DEGENERATIVE JOINT DISEASE, GENERALIZED 07/28/2007    Past Medical History:  Diagnosis Date   Asthma    seasonal   GERD (gastroesophageal reflux disease)    Rheumatoid arteritis (HCC)     Family History  Problem Relation Age of Onset   Asthma Mother    Diabetes Mother  Hypertension Mother    Heart disease Mother    Asthma Father    Cancer Father        lung   Lung cancer Sister    Cancer Sister        breast at 53   Cancer Brother        unknown   Past Surgical History:  Procedure Laterality Date   ABDOMINAL HYSTERECTOMY     TOTAL KNEE ARTHROPLASTY Right 06/18/2015   Procedure: RIGHT TOTAL KNEE ARTHROPLASTY;  Surgeon: Mcarthur Rossetti, MD;  Location: [facility];  Service: Orthopedics;  Laterality: Right;   TOTAL KNEE ARTHROPLASTY Left 05/11/2017   Procedure: LEFT TOTAL KNEE ARTHROPLASTY;  Surgeon: Mcarthur Rossetti, MD;  Location: [facility];  Service: Orthopedics;  Laterality: Left;   Social History   Social History Narrative   Not on file   Immunization History  Administered Date(s) Administered   Influenza,inj,Quad PF,6+ Mos 09/19/2014, 07/23/2015, 08/04/2018, 07/08/2020   Influenza,inj,Quad PF,6-35 Mos 11/16/2017   PFIZER Comirnaty(Gray Top)Covid-19 Tri-Sucrose Vaccine 11/22/2020   PFIZER(Purple Top)SARS-COV-2 Vaccination 02/29/2020, 03/26/2020   Pneumococcal Polysaccharide-23 10/26/2002   Tdap 07/03/2016, 08/04/2018     Objective: Vital Signs: BP 119/83 (BP Location: Left Arm, Patient Position: Sitting, Cuff Size: Large)   Pulse (!) 114   Ht 5' 2.5" (1.588 m)   Wt 217 lb (98.4 kg)   BMI 39.06 kg/m    Physical Exam Constitutional:      Appearance: She is obese.  Cardiovascular:     Rate and Rhythm: Regular rhythm. Tachycardia present.     Comments: Sweating and fast regular heart rate attributed to rushing into clinic coming from hot outdoor weather, feels fine Pulmonary:     Effort: Pulmonary effort is normal.     Breath sounds: Normal breath sounds.  Skin:    General: Skin is warm and dry.     Findings: No rash.  Neurological:     General: No focal deficit present.     Mental Status: She is alert.     Musculoskeletal Exam:  Shoulders full ROM no tenderness or swelling Elbows decreased flexion and extension range of motion Wrists decreased flexion extension range of motion Right hand 2nd-4th MCP joint chronic swelling or joint enlargement with subluxation and deviation, left hand swan-neck deformity of multiple digits without palpable synovitis, bilateral Z-shaped deformity of thumbs Knees full ROM no swelling well-healed surgical scars Ankles full ROM no tenderness or swelling MTPs cocked up toe deformities  without tenderness to squeeze or swelling   CDAI Exam: CDAI Score: 6  Patient Global: 40 mm; Provider Global: 20 mm Swollen: 0 ; Tender: 0  Joint Exam 04/16/2021   All documented joints were normal     Investigation: No additional findings.  Imaging: No results found.  Recent Labs: Lab Results  Component Value Date   WBC 7.9 01/08/2021   HGB 12.5 02/18/2021   PLT 472 (H) 01/08/2021   NA 141 01/08/2021   K 4.2 01/08/2021   CL 102 01/08/2021   CO2 25 01/08/2021   GLUCOSE 102 (H) 01/08/2021   BUN 11 01/08/2021   CREATININE 0.63 01/08/2021   BILITOT 0.3 01/08/2021   ALKPHOS 75 01/08/2021   AST 12 01/08/2021   ALT 12 01/08/2021   PROT 7.8 01/08/2021   ALBUMIN 4.0 01/08/2021   CALCIUM 9.3 01/08/2021   GFRAA 123 10/11/2020   QFTBGOLDPLUS NEGATIVE 10/11/2020    Speciality Comments: No specialty comments available.  Procedures:  No procedures performed Allergies: Methotrexate  derivatives, Peanut-containing drug products, Acyclovir and related, Aspirin, Eggs or egg-derived products, and Tomato   Assessment / Plan:     Visit Diagnoses: Rheumatoid arthritis involving multiple sites, unspecified whether rheumatoid factor present ([place]) - Plan: Upadacitinib ER 15 MG TB24  Arthritis disease activity appears low today but she is currently completing a prednisone taper down from high-dose for her acute bronchitis.  She does report this was doing well on the Rinvoq prior to running out of the medicine.  I recommend restarting the Rinvoq 15 mg daily continue hydroxychloroquine I would like to see her succeed discontinuing prednisone completely versus staying on at 5 mg dose for now.  We will plan to follow-up within 4 weeks for repeat labs after improved restart and about hopefully getting off prednisone.  Orders: No orders of the defined types were placed in this encounter.  Meds ordered this encounter  Medications   Upadacitinib ER 15 MG TB24    Sig: Take 15 mg by mouth  daily.    Dispense:  30 tablet    Refill:  0      Follow-Up Instructions: Return in about 22 days (around 05/08/2021) for RA f/u UPA restart 3wks f/u.   Collier Salina, MD  Note - This record has been created using [employer].  Chart creation errors have been sought, but may not always  have been located. Such creation errors do not reflect on  the standard of medical care.

## 2021-04-30 ENCOUNTER — Other Ambulatory Visit: Payer: Self-pay | Admitting: Internal Medicine

## 2021-04-30 DIAGNOSIS — M069 Rheumatoid arthritis, unspecified: Secondary | ICD-10-CM

## 2021-05-08 NOTE — Progress Notes (Signed)
Office Visit Note  Patient: Kathy [name]             Date of Birth: 12/16/1965           MRN: [id number]             PCP: Bess Kinds, MD Referring: Bess Kinds, MD Visit Date: 05/09/2021   Subjective:  Follow-up (Patient feels as if symptoms are well controlled on Rinvoq 15 mg daily, PLQ 200 mg daily, Prednisone 5 mg daily.)   History of Present Illness: Kathy [name] is a 55 y.o. female here for follow up for seropositive rheumatoid arthritis on Rinvoq 15 mg daily Plaquenil 200 mg daily and prednisone 5 mg daily.  After her last visit she restarted the Rinvoq due to lapse in treatment for a period of time.  She feels her symptoms are currently well controlled on this regimen and has no complaints.    Review of Systems  Constitutional:  Negative for fatigue.  HENT:  Negative for mouth sores, mouth dryness and nose dryness.   Eyes:  Negative for pain, itching, visual disturbance and dryness.  Respiratory:  Positive for cough. Negative for hemoptysis, shortness of breath and difficulty breathing.   Cardiovascular:  Negative for chest pain, palpitations and swelling in legs/feet.  Gastrointestinal:  Negative for abdominal pain, blood in stool, constipation and diarrhea.  Endocrine: Negative for increased urination.  Genitourinary:  Negative for painful urination.  Musculoskeletal:  Negative for joint pain, joint pain, joint swelling, myalgias, muscle weakness, morning stiffness, muscle tenderness and myalgias.  Skin:  Negative for color change, rash and redness.  Allergic/Immunologic: Negative for susceptible to infections.  Neurological:  Negative for dizziness, numbness, headaches, memory loss and weakness.  Hematological:  Negative for swollen glands.  Psychiatric/Behavioral:  Negative for confusion and sleep disturbance.    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   Anemia 09/18/2014   Rheumatoid arthritis (HCC) 09/15/2009   DEGENERATIVE JOINT DISEASE, GENERALIZED 07/28/2007    Past Medical History:  Diagnosis Date   Asthma    seasonal   GERD (gastroesophageal reflux disease)    Rheumatoid arteritis (HCC)     Family History  Problem Relation Age of Onset   Asthma Mother    Diabetes Mother    Hypertension Mother    Heart disease Mother    Asthma Father    Cancer Father        lung   Lung cancer Sister    Cancer Sister        breast at 44   Cancer Brother        unknown   Past Surgical History:  Procedure Laterality Date   ABDOMINAL HYSTERECTOMY     TOTAL KNEE ARTHROPLASTY Right 06/18/2015   Procedure: RIGHT TOTAL KNEE ARTHROPLASTY;  Surgeon: Kathryne Hitch, MD;  Location: [facility];  Service: Orthopedics;  Laterality: Right;   TOTAL KNEE ARTHROPLASTY Left 05/11/2017   Procedure: LEFT TOTAL KNEE ARTHROPLASTY;  Surgeon: Kathryne Hitch, MD;  Location: [facility];  Service: Orthopedics;  Laterality: Left;   Social History  Social History Narrative   Not on file   Immunization History  Administered Date(s) Administered   Influenza,inj,Quad PF,6+ Mos 09/19/2014, 07/23/2015, 08/04/2018, 07/08/2020   Influenza,inj,Quad PF,6-35 Mos 11/16/2017   PFIZER Comirnaty(Gray Top)Covid-19 Tri-Sucrose Vaccine 11/22/2020   PFIZER(Purple Top)SARS-COV-2 Vaccination 02/29/2020, 03/26/2020   Pneumococcal Polysaccharide-23 10/26/2002   Tdap 07/03/2016, 08/04/2018     Objective: Vital Signs: BP 116/80 (BP Location: Left Arm, Patient Position: Sitting, Cuff Size: Normal)   Pulse (!) 114   Ht 5\' 4"  (1.626  m)   Wt 216 lb (98 kg)   BMI 37.08 kg/m    Physical Exam Constitutional:      Appearance: She is obese.  Eyes:     Conjunctiva/sclera: Conjunctivae normal.  Cardiovascular:     Rate and Rhythm: Normal rate and regular rhythm.  Pulmonary:     Effort: Pulmonary effort is normal.     Breath sounds: Normal breath sounds.  Skin:    General: Skin is warm and dry.     Findings: No rash.  Neurological:     General: No focal deficit present.     Mental Status: She is alert.     Deep Tendon Reflexes: Reflexes normal.  Psychiatric:        Mood and Affect: Mood normal.     Musculoskeletal Exam:  Bilateral elbows severely reduced flexion and extension range of motion Bilateral wrists decreased flexion and extension range of motion Extensive chronic MCP joint enlargement there is mild ulnar deviation and swan-neck deformities and PIP hyperextension of most fingers with no tenderness or palpable synovitis Bilateral knees no tenderness or effusions  CDAI Exam: CDAI Score: 2  Patient Global: 10 mm; Provider Global: 10 mm Swollen: 0 ; Tender: 0  Joint Exam 05/09/2021   All documented joints were normal     Investigation: No additional findings.  Imaging: No results found.  Recent Labs: Lab Results  Component Value Date   WBC 7.9 01/08/2021   HGB 12.5 02/18/2021   PLT 472 (H) 01/08/2021   NA 141 01/08/2021   K 4.2 01/08/2021   CL 102 01/08/2021   CO2 25 01/08/2021   GLUCOSE 102 (H) 01/08/2021   BUN 11 01/08/2021   CREATININE 0.63 01/08/2021   BILITOT 0.3 01/08/2021   ALKPHOS 75 01/08/2021   AST 12 01/08/2021   ALT 12 01/08/2021   PROT 7.8 01/08/2021   ALBUMIN 4.0 01/08/2021   CALCIUM 9.3 01/08/2021   GFRAA 123 10/11/2020   QFTBGOLDPLUS NEGATIVE 10/11/2020    Speciality Comments: No specialty comments available.  Procedures:  No procedures performed Allergies: Methotrexate derivatives, Peanut-containing drug products, Acyclovir and related, Aspirin, Eggs or  egg-derived products, and Tomato   Assessment / Plan:     Visit Diagnoses: Rheumatoid arthritis involving multiple sites, unspecified whether rheumatoid factor present (HCC) - Plan: Ambulatory referral to Ophthalmology, Sedimentation rate  Rheumatoid arthritis appears to be in low disease activity today on the current medicines.  Checking labs including sedimentation rate assuming these are normal we will recommend discontinuing the current low-dose prednisone.  Plan to continue upadacitinib 15 mg daily and Plaquenil 200 mg daily.  High risk medication use - Plan: Ambulatory referral to Ophthalmology, COMPLETE METABOLIC PANEL WITH GFR, CBC with Differential/Platelet, Lipid panel  Lupita's at end of monitoring for toxicity checking CBC and CMP today.  Checking lipid panel since getting back on medication last comparison in March of this year.  Discussed long-term use of hydroxychloroquine requires monitoring for retinal toxicity referral placed to [facility] ophthalmology.  Mixed hyperlipidemia  Rechecking lipid panel after restarting upadacitinib, panel was okay in March of this year.  Orders: Orders Placed This Encounter  Procedures   Sedimentation rate   COMPLETE METABOLIC PANEL WITH GFR   CBC with Differential/Platelet   Lipid panel   Ambulatory referral to Ophthalmology    No orders of the defined types were placed in this encounter.    Follow-Up Instructions: Return in about 3 months (around 08/09/2021) for RA on UPA+HCQ f/u 70mos.   Fuller Plan, MD  Note - This record has been created using [employer].  Chart creation errors have been sought, but may not always  have been located. Such creation errors do not reflect on  the standard of medical care.

## 2021-05-09 ENCOUNTER — Other Ambulatory Visit: Payer: Self-pay

## 2021-05-09 ENCOUNTER — Encounter: Payer: Self-pay | Admitting: Internal Medicine

## 2021-05-09 ENCOUNTER — Ambulatory Visit: Payer: Medicaid Other | Admitting: Internal Medicine

## 2021-05-09 VITALS — BP 116/80 | HR 114 | Ht 64.0 in | Wt 216.0 lb

## 2021-05-09 DIAGNOSIS — E782 Mixed hyperlipidemia: Secondary | ICD-10-CM | POA: Diagnosis not present

## 2021-05-09 DIAGNOSIS — Z79899 Other long term (current) drug therapy: Secondary | ICD-10-CM | POA: Diagnosis not present

## 2021-05-09 DIAGNOSIS — M069 Rheumatoid arthritis, unspecified: Secondary | ICD-10-CM | POA: Diagnosis not present

## 2021-05-10 LAB — COMPLETE METABOLIC PANEL WITH GFR
AG Ratio: 1.2 (calc) (ref 1.0–2.5)
ALT: 20 U/L (ref 6–29)
AST: 16 U/L (ref 10–35)
Albumin: 4.5 g/dL (ref 3.6–5.1)
Alkaline phosphatase (APISO): 56 U/L (ref 37–153)
BUN: 14 mg/dL (ref 7–25)
CO2: 28 mmol/L (ref 20–32)
Calcium: 9.6 mg/dL (ref 8.6–10.4)
Chloride: 104 mmol/L (ref 98–110)
Creat: 0.7 mg/dL (ref 0.50–1.03)
Globulin: 3.7 g/dL (calc) (ref 1.9–3.7)
Glucose, Bld: 89 mg/dL (ref 65–99)
Potassium: 4.3 mmol/L (ref 3.5–5.3)
Sodium: 139 mmol/L (ref 135–146)
Total Bilirubin: 0.6 mg/dL (ref 0.2–1.2)
Total Protein: 8.2 g/dL — ABNORMAL HIGH (ref 6.1–8.1)
eGFR: 103 mL/min/{1.73_m2} (ref >=60)

## 2021-05-10 LAB — CBC WITH DIFFERENTIAL/PLATELET
Absolute Monocytes: 265 cells/uL (ref 200–950)
Basophils Absolute: 21 cells/uL (ref 0–200)
Basophils Relative: 0.4 %
Eosinophils Absolute: 21 cells/uL (ref 15–500)
Eosinophils Relative: 0.4 %
HCT: 40.3 % (ref 35.0–45.0)
Hemoglobin: 13.4 g/dL (ref 11.7–15.5)
Lymphs Abs: 2422 cells/uL (ref 850–3900)
MCH: 31 pg (ref 27.0–33.0)
MCHC: 33.3 g/dL (ref 32.0–36.0)
MCV: 93.3 fL (ref 80.0–100.0)
MPV: 9.1 fL (ref 7.5–12.5)
Monocytes Relative: 5 %
Neutro Abs: 2571 cells/uL (ref 1500–7800)
Neutrophils Relative %: 48.5 %
Platelets: 375 10*3/uL (ref 140–400)
RBC: 4.32 10*6/uL (ref 3.80–5.10)
RDW: 12.2 % (ref 11.0–15.0)
Total Lymphocyte: 45.7 %
WBC: 5.3 10*3/uL (ref 3.8–10.8)

## 2021-05-10 LAB — LIPID PANEL
Cholesterol: 194 mg/dL (ref <200)
HDL: 61 mg/dL (ref >=50)
LDL Cholesterol (Calc): 116 mg/dL (calc) — ABNORMAL HIGH
Non-HDL Cholesterol (Calc): 133 mg/dL (calc) — ABNORMAL HIGH (ref <130)
Total CHOL/HDL Ratio: 3.2 (calc) (ref <5.0)
Triglycerides: 71 mg/dL (ref <150)

## 2021-05-10 LAB — SEDIMENTATION RATE: Sed Rate: 130 mm/h — ABNORMAL HIGH (ref 0–30)

## 2021-05-12 NOTE — Progress Notes (Signed)
Lab tests look safe to continue current medications. Sedimentation rate remains very high out of proportion to her joint swelling at our visit. I recommend she try stopping the daily prednisone while continuing the rinvoq and plaquenil, but if her symptoms get worse can restart this 5 mg dose as needed.

## 2021-05-17 ENCOUNTER — Encounter (HOSPITAL_COMMUNITY): Payer: Self-pay

## 2021-05-17 ENCOUNTER — Other Ambulatory Visit: Payer: Self-pay

## 2021-05-17 ENCOUNTER — Ambulatory Visit (INDEPENDENT_AMBULATORY_CARE_PROVIDER_SITE_OTHER): Payer: Medicaid Other

## 2021-05-17 ENCOUNTER — Ambulatory Visit (HOSPITAL_COMMUNITY)
Admission: EM | Admit: 2021-05-17 | Discharge: 2021-05-17 | Disposition: A | Discharge status: Home / Self Care | Payer: Medicaid Other | Attending: Emergency Medicine | Admitting: Emergency Medicine

## 2021-05-17 DIAGNOSIS — J4521 Mild intermittent asthma with (acute) exacerbation: Secondary | ICD-10-CM | POA: Diagnosis not present

## 2021-05-17 DIAGNOSIS — R059 Cough, unspecified: Secondary | ICD-10-CM | POA: Diagnosis not present

## 2021-05-17 MED ORDER — PREDNISONE 10 MG (21) PO TBPK: ORAL_TABLET | Freq: Every day | ORAL | 0 refills | Status: DC

## 2021-05-17 NOTE — Discharge Instructions (Addendum)
Take the prednisone taper as prescribed.    Use your inhaler as needed.   Rest and drink plenty of fluids.    Follow up with your primary care provider or go to the Emergency Department if symptoms worsen or do not improve in the next few days.

## 2021-05-17 NOTE — ED Provider Notes (Signed)
[facility]    CSN: [id number] Arrival date & time: 05/17/21  1149      History   Chief Complaint Chief Complaint  Patient presents with   Cough    HPI Kathy [name] is a 55 y.o. female.   Patient here for evaluation of cough that has been worsening over the past 2 days.  Reports being seen and treated for bronchitis in June but cough is worsening.  Patient does have history of asthma.  Has not taken any OTC medications or treatments. Denies any trauma, injury, or other precipitating event.  Denies any specific alleviating or aggravating factors.  Denies any fevers, chest pain, shortness of breath, N/V/D, numbness, tingling, weakness, abdominal pain, or headaches.    The history is provided by the patient.  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   Anemia 09/18/2014   Rheumatoid arthritis (HCC) 09/15/2009   DEGENERATIVE JOINT DISEASE, GENERALIZED 07/28/2007    Past Surgical History:  Procedure Laterality Date   ABDOMINAL HYSTERECTOMY     TOTAL KNEE ARTHROPLASTY Right 06/18/2015   Procedure: RIGHT TOTAL KNEE ARTHROPLASTY;  Surgeon: Kathryne Hitch, MD;  Location: [facility] OR;  Service: Orthopedics;  Laterality: Right;   TOTAL KNEE ARTHROPLASTY Left 05/11/2017   Procedure: LEFT TOTAL KNEE ARTHROPLASTY;  Surgeon: Kathryne Hitch, MD;  Location: [facility]  OR;  Service: Orthopedics;  Laterality: Left;    OB History   No obstetric history on file.      Home Medications    Prior to Admission medications   Medication Sig Start Date End Date Taking? Authorizing Provider  predniSONE (STERAPRED UNI-PAK 21 TAB) 10 MG (21) TBPK tablet Take by mouth daily. Take 6 tabs by mouth daily  for 2 days, then 5 tabs for 2 days, then 4 tabs for 2 days, then 3 tabs for 2 days, 2 tabs for 2 days, then 1 tab by mouth daily for 2 days 05/17/21  Yes Ivette Loyal, NP  albuterol (PROVENTIL HFA) 108 (90 Base) MCG/ACT inhaler INHALE 1 TO 2 PUFFS INTO THE LUNGS EVERY6 HOURS AS NEEDED FOR WHEEZING OR SHORTNESS OF BREATH 02/18/21   Mullis, Kiersten P, DO  amLODipine (NORVASC) 10 MG tablet Take 1 tablet (10 mg total) by mouth at bedtime. 01/08/21   Mullis, Kiersten P, DO  atorvastatin (LIPITOR) 40 MG tablet TAKE 1 TABLET BY MOUTH EVERY DAY 10/28/20   Mullis, Kiersten P, DO  citalopram (CELEXA) 40 MG tablet TAKE 1 TABLET BY MOUTH EVERY DAY 01/31/21   Mullis, Kiersten P, DO  diclofenac Sodium (VOLTAREN) 1 % GEL Apply 2g 4 times daily to affected area as needed for pain. 07/08/20   Mullis, Kiersten P, DO  doxycycline (VIBRAMYCIN) 100 MG capsule Take 1 capsule (100 mg total) by mouth 2 (two) times daily. 03/28/21   Raspet, Erin K, PA-C  famotidine ([facility] ACID CONTROLLER) 10  MG tablet Take 1 tablet (10 mg total) by mouth 2 (two) times daily. 11/06/20   Mullis, Kiersten P, DO  fluticasone (FLONASE) 50 MCG/ACT nasal spray Place 2 sprays into both nostrils daily. 07/01/18   Arlyce Harman, DO  folic acid (FOLVITE) 1 MG tablet Take 1 tablet by mouth daily. 02/03/21   [provider]  hydroxychloroquine (PLAQUENIL) 200 MG tablet Take 1 tablet (200 mg total) by mouth daily. 12/03/20   Rice, Jamesetta Orleans, MD  SENNA LAX 8.6 MG tablet TAKE ONE TABLET BY MOUTH EVERY DAY AS NEEDED FOR MILD CONSTIPATION 03/25/17   Joanna Puff, MD  tiZANidine (ZANAFLEX) 4 MG tablet TAKE 1 TABLET BY MOUTH  EVERY 8 HOURS AS NEEDED FOR MUSCLE SPASM 02/18/21   Mullis, Kiersten P, DO  traMADol (ULTRAM) 50 MG tablet Take 1 tablet (50 mg total) by mouth as needed. 02/18/21   Mullis, Kiersten P, DO  Upadacitinib ER 15 MG TB24 Take 15 mg by mouth daily. 04/16/21   Fuller Plan, MD  valACYclovir (VALTREX) 1000 MG tablet Take 1,000 mg by mouth as needed.    [provider]    Family History Family History  Problem Relation Age of Onset   Asthma Mother    Diabetes Mother    Hypertension Mother    Heart disease Mother    Asthma Father    Cancer Father        lung   Lung cancer Sister    Cancer Sister        breast at 58   Cancer Brother        unknown    Social History Social History   Tobacco Use   Smoking status: Never   Smokeless tobacco: Never  Vaping Use   Vaping Use: Never used  Substance Use Topics   Alcohol use: No   Drug use: No     Allergies   Methotrexate derivatives, Peanut-containing drug products, Acyclovir and related, Aspirin, Eggs or egg-derived products, and Tomato   Review of Systems Review of Systems  Constitutional:  Negative for fever.  HENT:  Negative for congestion.   Respiratory:  Positive for cough.   Cardiovascular:  Negative for chest pain.  All other systems reviewed and are negative.   Physical Exam Triage Vital Signs ED Triage Vitals  Enc Vitals Group     BP 05/17/21 1220 (!) 135/91     Pulse Rate 05/17/21 1220 (!) 108     Resp 05/17/21 1220 16     Temp 05/17/21 1220 98.5 F (36.9 C)     Temp Source 05/17/21 1220 Oral     SpO2 05/17/21 1220 98 %     Weight --      Height --      Head Circumference --      Peak Flow --      Pain Score 05/17/21 1221 7     Pain Loc --      Pain Edu? --      Excl. in GC? --    No data found.  Updated Vital Signs BP (!) 135/91 (BP Location: Left Arm)   Pulse (!) 108   Temp 98.5 F (36.9 C) (Oral)   Resp 16   SpO2 98%   Visual Acuity Right Eye Distance:   Left Eye Distance:    Bilateral Distance:    Right Eye Near:   Left Eye Near:    Bilateral Near:     Physical Exam Vitals and nursing  note reviewed.  Constitutional:      General: She is not in acute distress.    Appearance: Normal appearance. She is not ill-appearing, toxic-appearing or diaphoretic.  HENT:     Head: Normocephalic and atraumatic.  Eyes:     Conjunctiva/sclera: Conjunctivae normal.  Cardiovascular:     Rate and Rhythm: Normal rate.     Pulses: Normal pulses.     Heart sounds: Normal heart sounds.  Pulmonary:     Effort: Pulmonary effort is normal.     Breath sounds: Examination of the right-upper field reveals decreased breath sounds. Examination of the left-upper field reveals decreased breath sounds. Examination of the right-lower field reveals decreased breath sounds. Examination of the left-lower field reveals decreased breath sounds. Decreased breath sounds present.  Abdominal:     General: Abdomen is flat.  Musculoskeletal:        General: Normal range of motion.     Cervical back: Normal range of motion.  Skin:    General: Skin is warm and dry.  Neurological:     General: No focal deficit present.     Mental Status: She is alert and oriented to person, place, and time.  Psychiatric:        Mood and Affect: Mood normal.     UC Treatments / Results  Labs (all labs ordered are listed, but only abnormal results are displayed) Labs Reviewed - No data to display  EKG   Radiology DG Chest 2 View  Result Date: 05/17/2021 CLINICAL DATA:  Cough for the past month. EXAM: CHEST - 2 VIEW COMPARISON:  12/08/2017 FINDINGS: The heart size and mediastinal contours are within normal limits. Both lungs are clear. The visualized skeletal structures are unremarkable. Negative for a pneumothorax. IMPRESSION: No active cardiopulmonary disease. Electronically Signed   By: Richarda Overlie M.D.   On: 05/17/2021 13:09    Procedures Procedures (including critical care time)  Medications  Ordered in UC Medications - No data to display  Initial Impression / Assessment and Plan / UC Course  I have reviewed the triage vital signs and the nursing notes.  Pertinent labs & imaging results that were available during my care of the patient were reviewed by me and considered in my medical decision making (see chart for details).    Assessment negative for red flags or concerns.  Xray with no active cardiopulmonary disease.  Possible acute asthma exacerbation.  Will treat with prednisone taper and then resume normal steroid dose.  Continue to use inhaler as needed.  Follow up with primary care as needed.   Final Clinical Impressions(s) / UC Diagnoses   Final diagnoses:  Mild intermittent asthma with exacerbation     Discharge Instructions      Take the prednisone taper as prescribed.    Use your inhaler as needed.   Rest and drink plenty of fluids.    Follow up with your primary care provider or go to the Emergency Department if symptoms worsen or do not improve in the next few days.      ED Prescriptions     Medication Sig Dispense Auth. Provider   predniSONE (STERAPRED UNI-PAK 21 TAB) 10 MG (21) TBPK tablet Take by mouth daily. Take 6 tabs by mouth daily  for 2 days, then 5 tabs for 2 days, then 4 tabs for 2 days, then 3 tabs for 2 days, 2 tabs for 2 days, then 1 tab by mouth daily for 2 days 42 tablet Ivette Loyal, NP  PDMP not reviewed this encounter.   Ivette Loyal, NP 05/17/21 1326

## 2021-05-17 NOTE — ED Triage Notes (Signed)
Pt present severe coughing with congestion, symptom started two days ago. Pt states the cough got worst this am.

## 2021-05-20 ENCOUNTER — Other Ambulatory Visit: Payer: Self-pay | Admitting: Internal Medicine

## 2021-05-23 ENCOUNTER — Other Ambulatory Visit: Payer: Self-pay

## 2021-05-23 DIAGNOSIS — M069 Rheumatoid arthritis, unspecified: Secondary | ICD-10-CM

## 2021-05-23 MED ORDER — UPADACITINIB ER 15 MG PO TB24: 15.0000 mg | ORAL_TABLET | Freq: Every day | ORAL | 2 refills | Status: DC

## 2021-05-23 MED ORDER — TIZANIDINE HCL 4 MG PO TABS: ORAL_TABLET | ORAL | 2 refills | Status: DC

## 2021-05-23 NOTE — Telephone Encounter (Signed)
(  1) Patient called requesting prescription of [facility] [address] [facility]    (2) Patient also requested prescription refill of Rinvoq to be sent to the specialty pharmacy.

## 2021-05-23 NOTE — Telephone Encounter (Signed)
[facility] [address] [facility]  Patient also requested prescription refill of Rinvoq to be sent to the specialty pharmacy.  Next Visit: 08/08/2021  Last Visit: 05/09/2021  Last Fill: Rinvoq 04/16/2021, Zanaflex prescribed by Orpah Cobb  [id number] arthritis involving multiple sites, unspecified whether rheumatoid factor present  Current Dose per office note 05/09/2021: upadacitinib 15 mg daily   Labs: 05/09/2021, Lab tests look safe to continue current medications. Sedimentation rate remainsvery high out of proportion to her joint swelling at our visit. I recommend she try stopping the daily prednisone while continuing the rinvoq and plaquenil, but if her symptoms get worse can restart this 5 mg dose asneeded.  TB Gold: 10/11/2020, negative   Okay to refill Rinvoq and zanaflex?

## 2021-06-04 ENCOUNTER — Telehealth: Payer: Self-pay | Admitting: Student

## 2021-06-04 NOTE — Telephone Encounter (Signed)
..   Medicaid Managed Care   Unsuccessful Outreach Note  06/04/2021 Name: Kathy [name] MRN: [id number] DOB: [birth year]  Referred by: Bess Kinds, MD Reason for referral : Appointment and High Risk Managed Medicaid (Attempted to reach Ms.Hennen today to offer her a phone visit with the [facility] RNCM and the Pharmacist. I left my name and number on her VM.)   An unsuccessful telephone outreach was attempted today. The patient was referred to the case management team for assistance with care management and care coordination.   Follow Up Plan: The care management team will reach out to the patient again over the next 7-14 days.   Weston Settle Care Guide, High Risk Medicaid Managed Care Embedded Care Coordination [facility]  [facility]

## 2021-06-18 ENCOUNTER — Telehealth: Payer: Self-pay | Admitting: Student

## 2021-06-18 ENCOUNTER — Other Ambulatory Visit: Payer: Self-pay | Admitting: Internal Medicine

## 2021-06-18 ENCOUNTER — Other Ambulatory Visit: Payer: Self-pay

## 2021-06-18 DIAGNOSIS — J4520 Mild intermittent asthma, uncomplicated: Secondary | ICD-10-CM

## 2021-06-18 DIAGNOSIS — M069 Rheumatoid arthritis, unspecified: Secondary | ICD-10-CM

## 2021-06-18 NOTE — Telephone Encounter (Signed)
Next Visit: 08/08/2021 Last Visit: 05/09/2021  Last Fill: 05/17/2021   DX: Rheumatoid arthritis involving multiple sites, unspecified whether rheumatoid factor present  Current Dose per office note 05/09/2021: ??? Unsure of current dose or if patient was supposed to continue on Prednisone  Okay to refill Prednisone?

## 2021-06-18 NOTE — Telephone Encounter (Signed)
..   Medicaid Managed Care   Unsuccessful Outreach Note  06/18/2021 Name: Kathy [name] MRN: [id number] DOB: 03-14-1966  Referred by: Bess Kinds, MD Reason for referral : High Risk Managed Medicaid (I called the patient today to offer her a phone visit with the [facility] Team. I left my name and number on her VM.)   A second unsuccessful telephone outreach was attempted today. The patient was referred to the case management team for assistance with care management and care coordination.   Follow Up Plan: The care management team will reach out to the patient again over the next 7-14 days.   Weston Settle Care Guide, High Risk Medicaid Managed Care Embedded Care Coordination [facility]  [facility]

## 2021-06-19 MED ORDER — ALBUTEROL SULFATE HFA 108 (90 BASE) MCG/ACT IN AERS: INHALATION_SPRAY | RESPIRATORY_TRACT | 0 refills | Status: DC

## 2021-06-24 NOTE — Progress Notes (Signed)
    SUBJECTIVE:   CHIEF COMPLAINT / HPI:  Patient complains of bronchitis for the last month. She took a course of antibiotics and steroids for her bronchitis, after her fist urgent care visit. And received only a course of steroids for her second urgent care visit. She appreciates no resolution of symptoms with the antibiotics or steroids. Her symptoms began with a cough that was productive of clear mucous. She also had a hard time catching her breath when she was coughing, and it would occur every night for 2-3 hours. Sometimes cough syrup and mucinex helped with the coughing. Reports that her albuterol inhaler helped the most with the coughing and shortness of breath.  She reports symptoms daily at night, requiring the use of her rescue inhaler. She's seen a doctor twice and received steroids both times in the last 2-3 months for these symptoms. She also appreciates that moving around/increased activity level also triggers her symptoms.    PERTINENT  PMH / PSH: HTN, HLD, Asthma, RA  OBJECTIVE:   BP (!) 131/93   Pulse (!) 101   Ht 5\' 2"  (1.575 m)   Wt 103.5 kg   SpO2 98%   BMI 41.72 kg/m   Physical Exam Constitutional:      Appearance: Normal appearance.  HENT:     Head: Normocephalic.     Nose: No congestion or rhinorrhea.  Eyes:     Extraocular Movements: Extraocular movements intact.     Conjunctiva/sclera: Conjunctivae normal.  Cardiovascular:     Rate and Rhythm: Normal rate and regular rhythm.     Heart sounds: Normal heart sounds. No murmur heard.   No friction rub. No gallop.  Pulmonary:     Effort: Pulmonary effort is normal.     Comments: Coarse breath sounds diffusely. Diminished breath sounds over right lung fields Abdominal:     General: Bowel sounds are normal. There is no distension.     Palpations: Abdomen is soft.     Tenderness: no abdominal tenderness  Neurological:     Mental Status: She is alert.  Psychiatric:        Mood and Affect: Mood normal.      ASSESSMENT/PLAN:   Asthma It is unlikely that Bronchitis would persist past a month after therapy with no other infectious symptoms. Given length of symptoms and relief with albuterol inhaler, patient is likely having symptoms 2/2 her asthma. Patient meets criteria for Moderately Persistent Asthma, with events happening every night. Will begin controller medication with ICS and LABA -Start Advair HFA 115-21 (low dose) -Follow up in one month -Encourage patient to follow up sooner if symptoms don't imrpve -Patient educated on Asthma and emergency symptoms   -Refilled Folic Acid  , MD [facility]

## 2021-06-25 ENCOUNTER — Other Ambulatory Visit: Payer: Self-pay

## 2021-06-25 ENCOUNTER — Encounter: Payer: Self-pay | Admitting: Student

## 2021-06-25 ENCOUNTER — Ambulatory Visit (INDEPENDENT_AMBULATORY_CARE_PROVIDER_SITE_OTHER): Payer: Medicaid Other | Admitting: Student

## 2021-06-25 DIAGNOSIS — J4540 Moderate persistent asthma, uncomplicated: Secondary | ICD-10-CM

## 2021-06-25 MED ORDER — FOLIC ACID 1 MG PO TABS: 1.0000 mg | ORAL_TABLET | Freq: Every day | ORAL | 3 refills | Status: DC

## 2021-06-25 MED ORDER — ADVAIR HFA 115-21 MCG/ACT IN AERO: 2.0000 | INHALATION_SPRAY | Freq: Two times a day (BID) | RESPIRATORY_TRACT | 12 refills | Status: DC

## 2021-06-25 NOTE — Patient Instructions (Signed)
It was great to see you! Thank you for allowing me to participate in your care!  Happy Birthday!!   Our plans for today:  - Start Advair, two puffs twice a day (AM/PM) - Follow up appointment in 1 month  Please call EMS or go to Emergency Room, If you start to have any of the following symptoms: -Constant shortness of breath, difficulty talking/getting words out, breathing really fast and shallow  Please call the clinic and set up an appointment sooner if: -Breathing/coughing doesn't improve, or you continue to have symptoms at night.  Take care and seek immediate care sooner if you develop any concerns.   Hoping you had a wonderful birthday, with many more to come!!  Dr. Bess Kinds, MD [facility]

## 2021-06-25 NOTE — Assessment & Plan Note (Addendum)
It is unlikely that Bronchitis would persist past a month after therapy with no other infectious symptoms. Given length of symptoms and relief with albuterol inhaler, patient is likely having symptoms 2/2 her asthma. Patient meets criteria for Moderately Persistent Asthma, with events happening every night. Will begin controller medication with ICS and LABA -Start Advair HFA 115-21 (low dose) -Follow up in one month -Encourage patient to follow up sooner if symptoms don't imrpve -Patient educated on Asthma and emergency symptoms

## 2021-07-04 ENCOUNTER — Other Ambulatory Visit: Payer: Self-pay | Admitting: Student

## 2021-07-21 ENCOUNTER — Other Ambulatory Visit: Payer: Self-pay | Admitting: *Deleted

## 2021-07-21 DIAGNOSIS — M069 Rheumatoid arthritis, unspecified: Secondary | ICD-10-CM

## 2021-07-21 MED ORDER — TIZANIDINE HCL 4 MG PO TABS: ORAL_TABLET | ORAL | 2 refills | Status: DC

## 2021-07-21 NOTE — Telephone Encounter (Signed)
Next Visit: 08/08/2021  Last Visit: 05/09/2021  Last Fill: 05/23/2021, TAKE 1 TABLET BY MOUTH EVERY 8 HOURS AS NEEDED FOR MUSCLE SPASM # 30 RF 2   Dx: Rheumatoid arthritis involving multiple sites, unspecified whether rheumatoid factor present   Current Dose per office note on 05/09/2021: not mentioned  Okay to refill Zanaflex?

## 2021-07-24 ENCOUNTER — Other Ambulatory Visit: Payer: Self-pay | Admitting: Student

## 2021-07-24 DIAGNOSIS — J4520 Mild intermittent asthma, uncomplicated: Secondary | ICD-10-CM

## 2021-08-07 NOTE — Progress Notes (Deleted)
Office Visit Note  Patient: Kathy [name]             Date of Birth: 29-Jun-1966           MRN: [id number]             PCP: Holley Bouche, MD Referring: Holley Bouche, MD Visit Date: 08/08/2021   Subjective:  No chief complaint on file.   History of Present Illness: Kathy [name] is a 55 y.o. female here for follow up for seropositive RA on rinvoq 15 mg PO daily and HCQ 200 mg PO daily. ***   Previous HPI 05/09/21 Kathy [name] is a 55 y.o. female here for follow up for seropositive rheumatoid arthritis on Rinvoq 15 mg daily Plaquenil 200 mg daily and prednisone 5 mg daily.  After her last visit she restarted the Rinvoq due to lapse in treatment for a period of time.  She feels her symptoms are currently well controlled on this regimen and has no complaints.  10/11/20 Kathy [name] is a 55 y.o. female here to establish care of rheumatoid arthritis for which she previous saw Dr. Genene Churn in [facility] but is looking for an office closer to her residence due to difficulty with transportation. She has a history of highly active RA for years with pain that is worst in her shoulders, hands, knees, and feet with deforming arthritis of hands and feet and has had bilateral knee replacements for arthritis. Her most recent visit with their office was in October with disease in exacerbation at that time and she took a prednisone taper but has remained active taking 15mg  of prednisone currently 10mg  AM and 5mg  PM.   LAbs reviewed 07/2020 ESR 140   Medications MTX discontinued for nausea HCQ Tofacitinib Prednisone  No Rheumatology ROS completed.   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   Anemia 09/18/2014   Rheumatoid arthritis ([address]) 09/15/2009   DEGENERATIVE JOINT DISEASE, GENERALIZED 07/28/2007    Past Medical History:  Diagnosis Date   Asthma    seasonal   GERD (gastroesophageal reflux disease)    Rheumatoid arteritis ([place])     Family History  Problem Relation Age of Onset   Asthma Mother    Diabetes Mother    Hypertension Mother    Heart disease Mother    Asthma Father    Cancer Father        lung   Lung cancer Sister    Cancer Sister        breast at 82   Cancer Brother        unknown   Past Surgical History:  Procedure Laterality Date   ABDOMINAL HYSTERECTOMY     TOTAL KNEE ARTHROPLASTY Right 06/18/2015   Procedure: RIGHT TOTAL KNEE ARTHROPLASTY;  Surgeon: Mcarthur Rossetti, MD;  Location: [place];  Service: Orthopedics;  Laterality: Right;   TOTAL KNEE ARTHROPLASTY Left 05/11/2017   Procedure: LEFT TOTAL KNEE ARTHROPLASTY;  Surgeon: Mcarthur Rossetti, MD;  Location: [place];  Service: Orthopedics;  Laterality: Left;   Social History   Social History  Narrative   Not on file   Immunization History  Administered Date(s) Administered   Influenza,inj,Quad PF,6+ Mos 09/19/2014, 07/23/2015, 08/04/2018, 07/08/2020   Influenza,inj,Quad PF,6-35 Mos 11/16/2017   PFIZER Comirnaty(Gray Top)Covid-19 Tri-Sucrose Vaccine 11/22/2020   PFIZER(Purple Top)SARS-COV-2 Vaccination 02/29/2020, 03/26/2020   Pneumococcal Polysaccharide-23 10/26/2002   Tdap 07/03/2016, 08/04/2018     Objective: Vital Signs: There were no vitals taken for this visit.   Physical Exam   Musculoskeletal Exam: ***  CDAI Exam: CDAI Score: -- Patient Global: --; Provider Global: -- Swollen: --; Tender: -- Joint Exam 08/08/2021   No joint exam has been documented for this visit   There is currently no information  documented on the homunculus. Go to the Rheumatology activity and complete the homunculus joint exam.  Investigation: No additional findings.  Imaging: No results found.  Recent Labs: Lab Results  Component Value Date   WBC 5.3 05/09/2021   HGB 13.4 05/09/2021   PLT 375 05/09/2021   NA 139 05/09/2021   K 4.3 05/09/2021   CL 104 05/09/2021   CO2 28 05/09/2021   GLUCOSE 89 05/09/2021   BUN 14 05/09/2021   CREATININE 0.70 05/09/2021   BILITOT 0.6 05/09/2021   ALKPHOS 75 01/08/2021   AST 16 05/09/2021   ALT 20 05/09/2021   PROT 8.2 (H) 05/09/2021   ALBUMIN 4.0 01/08/2021   CALCIUM 9.6 05/09/2021   GFRAA 123 10/11/2020   QFTBGOLDPLUS NEGATIVE 10/11/2020    Speciality Comments: No specialty comments available.  Procedures:  No procedures performed Allergies: Methotrexate derivatives, Peanut-containing drug products, Acyclovir and related, Aspirin, Eggs or egg-derived products, and Tomato   Assessment / Plan:     Visit Diagnoses: No diagnosis found.  ***  Orders: No orders of the defined types were placed in this encounter.  No orders of the defined types were placed in this encounter.    Follow-Up Instructions: No follow-ups on file.   Collier Salina, MD  Note - This record has been created using [employer].  Chart creation errors have been sought, but may not always  have been located. Such creation errors do not reflect on  the standard of medical care.

## 2021-08-08 ENCOUNTER — Ambulatory Visit: Payer: Medicaid Other | Admitting: Internal Medicine

## 2021-08-14 ENCOUNTER — Other Ambulatory Visit: Payer: Self-pay

## 2021-08-14 DIAGNOSIS — M069 Rheumatoid arthritis, unspecified: Secondary | ICD-10-CM

## 2021-08-14 MED ORDER — UPADACITINIB ER 15 MG PO TB24: 15.0000 mg | ORAL_TABLET | Freq: Every day | ORAL | 2 refills | Status: DC

## 2021-08-14 NOTE — Telephone Encounter (Signed)
Next Visit: 08/08/2021   Last Visit: 05/09/2021   Last Fill:05/23/2021  Dx: Rheumatoid arthritis involving multiple sites, unspecified whether rheumatoid factor present    Current Dose per office note on 05/09/2021: upadacitinib 15 mg daily   Labs: 05/09/2021 Lab tests look safe to continue current medications.   TB Gold: 10/11/2020 Neg   Okay to refill upadacitinib?

## 2021-08-14 NOTE — Telephone Encounter (Signed)
Patient called requesting prescription refill of Upadac[facility]

## 2021-08-19 ENCOUNTER — Telehealth: Payer: Self-pay | Admitting: Student

## 2021-08-19 NOTE — Telephone Encounter (Signed)
..   Medicaid Managed Care   Unsuccessful Outreach Note  08/19/2021 Name: Kathy [name] MRN: [id number] DOB: [birth year]  Referred by: Bess Kinds, MD Reason for referral : High Risk Managed Medicaid (Called the patient today to get her scheduled for a phone visit with the [facility] Team. I left my name and number on her VM.)   Third unsuccessful telephone outreach was attempted today. The patient was referred to the case management team for assistance with care management and care coordination. The patient's primary care provider has been notified of our unsuccessful attempts to make or maintain contact with the patient. The care management team is pleased to engage with this patient at any time in the future should he/she be interested in assistance from the care management team.   Follow Up Plan: We have been unable to make contact with the patient for follow up. The care management team is available to follow up with the patient after provider conversation with the patient regarding recommendation for care management engagement and subsequent re-referral to the care management team.   Weston Settle Care Guide, High Risk Medicaid Managed Care Embedded Care Coordination [facility]  [facility]

## 2021-08-20 ENCOUNTER — Other Ambulatory Visit: Payer: Self-pay | Admitting: Internal Medicine

## 2021-08-20 ENCOUNTER — Other Ambulatory Visit: Payer: Self-pay | Admitting: Family Medicine

## 2021-08-20 DIAGNOSIS — K219 Gastro-esophageal reflux disease without esophagitis: Secondary | ICD-10-CM

## 2021-08-20 DIAGNOSIS — M069 Rheumatoid arthritis, unspecified: Secondary | ICD-10-CM

## 2021-09-01 NOTE — Progress Notes (Signed)
Office Visit Note  Patient: Kathy [name]             Date of Birth: May 17, 1966           MRN: [id number]             PCP: Holley Bouche, MD Referring: Holley Bouche, MD Visit Date: 09/02/2021   Subjective:  Rheumatoid Arthritis (Improving)   History of Present Illness: Kathy [name] is a 55 y.o. female here for follow up for seropositive RA on Rinvoq 15 mg p.o. daily. Recommended trying to taper off the remaining low-dose prednisone if symptoms were well controlled since the last visit. She missed her last appointment due to bronchitis and so is off the rinvoq for at least 2 months. This did not cause a severe flare but she definitely feels a difference and increased prednisone use, She has been taking prednisone 5 mg daily whenever she notices more joint pain than usual often associated with weather changes. She takes 10 mg rarely sometimes in raining weather.   Previous HPI 05/09/21 Kathy [name] is a 55 y.o. female here for follow up for seropositive rheumatoid arthritis on Rinvoq 15 mg daily Plaquenil 200 mg daily and prednisone 5 mg daily.  After her last visit she restarted the Rinvoq due to lapse in treatment for a period of time.  She feels her symptoms are currently well controlled on this regimen and has no complaints.     Review of Systems  Constitutional:  Negative for fatigue.  HENT:  Negative for mouth dryness.   Eyes:  Negative for dryness.  Respiratory:  Negative for shortness of breath.   Cardiovascular:  Negative for swelling in legs/feet.  Gastrointestinal:  Negative for constipation.  Endocrine: Positive for heat intolerance.  Genitourinary:  Negative for difficulty urinating.  Musculoskeletal:  Positive for joint pain, gait problem, joint pain, joint swelling, muscle weakness, morning stiffness and muscle tenderness.  Skin:  Negative for rash.  Allergic/Immunologic: Negative for susceptible to infections.  Neurological:  Negative for numbness.   Hematological:  Positive for bruising/bleeding tendency.  Psychiatric/Behavioral:  Negative for sleep disturbance.    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   Anemia 09/18/2014   Rheumatoid arthritis ([place]) 09/15/2009   DEGENERATIVE JOINT DISEASE, GENERALIZED 07/28/2007    Past Medical History:  Diagnosis Date   Asthma    seasonal   GERD (gastroesophageal reflux disease)    Rheumatoid arteritis ([place])     Family History  Problem Relation Age of Onset   Asthma Mother    Diabetes Mother    Hypertension Mother    Heart disease Mother    Asthma Father    Cancer Father        lung   Lung cancer Sister    Cancer Sister        breast at 85   Cancer Brother        unknown   Past Surgical History:  Procedure Laterality Date   ABDOMINAL HYSTERECTOMY     TOTAL KNEE ARTHROPLASTY Right 06/18/2015   Procedure: RIGHT TOTAL KNEE ARTHROPLASTY;  Surgeon: Kathryne Hitch, MD;  Location: [facility] OR;  Service: Orthopedics;  Laterality: Right;   TOTAL KNEE ARTHROPLASTY Left 05/11/2017   Procedure: LEFT TOTAL KNEE ARTHROPLASTY;  Surgeon: Kathryne Hitch, MD;  Location: [facility] OR;  Service: Orthopedics;  Laterality: Left;   Social History   Social History Narrative   Not on file   Immunization History  Administered Date(s) Administered   Influenza,inj,Quad PF,6+ Mos 09/19/2014, 07/23/2015, 08/04/2018, 07/08/2020   Influenza,inj,Quad PF,6-35 Mos 11/16/2017   PFIZER Comirnaty(Gray Top)Covid-19 Tri-Sucrose Vaccine 11/22/2020   PFIZER(Purple Top)SARS-COV-2  Vaccination 02/29/2020, 03/26/2020   Pneumococcal Polysaccharide-23 10/26/2002   Tdap 07/03/2016, 08/04/2018     Objective: Vital Signs: BP 131/77 (BP Location: Left Arm, Patient Position: Sitting, Cuff Size: Normal)   Pulse (!) 132   Resp 16   Ht 5\' 4"  (1.626 m)   Wt 225 lb (102.1 kg)   BMI 38.62 kg/m    Physical Exam Eyes:     Conjunctiva/sclera: Conjunctivae normal.  Cardiovascular:     Rate and Rhythm: Regular rhythm. Tachycardia present.  Musculoskeletal:     Right lower leg: No edema.     Left lower leg: No edema.  Skin:    General: Skin is warm and dry.     Findings: No rash.  Neurological:     Mental Status: She is alert.  Psychiatric:        Mood and Affect: Mood normal.     Musculoskeletal Exam:  Shoulders near full ROM, tenderness with abduction bilaterally Elbows very reduced flexion and extension with hard end points, no tenderness or swelling Right wrist ROM is decreased left normal Bilateral hands with multiple swan neck deformities chronic MCP hypertrophy no palpable synovitis on exam today Knees full ROM no tenderness or swelling Ankles full ROM no tenderness or swelling  CDAI Exam: CDAI Score: 5  Patient Global: 30 mm; Provider Global: 20 mm Swollen: 0 ; Tender: 0  Joint Exam 09/02/2021   All documented joints were normal     Investigation: No additional findings.  Imaging: No results found.  Recent Labs: Lab Results  Component Value Date   WBC 7.1 09/02/2021   HGB 12.1 09/02/2021   PLT 356 09/02/2021   NA 140 09/02/2021   K 4.0 09/02/2021   CL 104 09/02/2021   CO2 27 09/02/2021   GLUCOSE 86 09/02/2021   BUN 13 09/02/2021   CREATININE 0.63 09/02/2021   BILITOT 0.5 09/02/2021   ALKPHOS 75 01/08/2021   AST 18 09/02/2021   ALT 17 09/02/2021   PROT 7.5 09/02/2021   ALBUMIN 4.0 01/08/2021   CALCIUM 8.9 09/02/2021   GFRAA 123 10/11/2020   QFTBGOLDPLUS NEGATIVE 09/02/2021    Speciality Comments: No specialty comments  available.  Procedures:  No procedures performed Allergies: Methotrexate derivatives, Peanut-containing drug products, Acyclovir and related, Aspirin, Eggs or egg-derived products, and Tomato   Assessment / Plan:     Visit Diagnoses: Rheumatoid arthritis involving multiple sites, unspecified whether rheumatoid factor present (HCC) - Plan: Upadacitinib ER 15 MG TB24, Sedimentation rate  Low disease activity but requiring ongoing low-dose prednisone and sometimes increased symptoms taking 10 mg.  This is off rinvoq due to delayed follow up. Checking ESR for disease activity monitoring today.  Plan to restart Rinvoq 15 mg daily she can continue the 5 mg prednisone for now hopefully can tolerate discontinuing completely once back on that medication.  High risk medication use - Plan: CBC with Differential/Platelet, COMPLETE METABOLIC PANEL WITH GFR, QuantiFERON-TB Gold Plus  Monitoring for Jak inhibitor treatment checking CBC CMP and annual TB screening.   Orders: Orders Placed This Encounter  Procedures   Sedimentation rate   CBC with Differential/Platelet   COMPLETE METABOLIC PANEL WITH GFR   QuantiFERON-TB Gold Plus   Sed Rate Manual [place] Rflx    Meds ordered this encounter  Medications   Upadacitinib ER 15 MG TB24    Sig: Take 15 mg by mouth daily.    Dispense:  30 tablet    Refill:  2   DISCONTD: predniSONE (DELTASONE) 5 MG tablet    Sig: Take 1 tablet (5 mg total) by mouth daily as needed.    Refill:  0      Follow-Up Instructions: Return in about 3 months (around 12/03/2021) for RA on UPA/GCs f/u 22mos.   Collier Salina, MD  Note - This record has been created using [employer].  Chart creation errors have been sought, but may not always  have been located. Such creation errors do not reflect on  the standard of medical care.

## 2021-09-02 ENCOUNTER — Ambulatory Visit: Payer: Medicaid Other | Admitting: Internal Medicine

## 2021-09-02 ENCOUNTER — Encounter: Payer: Self-pay | Admitting: Internal Medicine

## 2021-09-02 ENCOUNTER — Other Ambulatory Visit: Payer: Self-pay

## 2021-09-02 VITALS — BP 131/77 | HR 132 | Resp 16 | Ht 64.0 in | Wt 225.0 lb

## 2021-09-02 DIAGNOSIS — M069 Rheumatoid arthritis, unspecified: Secondary | ICD-10-CM | POA: Diagnosis not present

## 2021-09-02 DIAGNOSIS — Z7952 Long term (current) use of systemic steroids: Secondary | ICD-10-CM

## 2021-09-02 DIAGNOSIS — Z79899 Other long term (current) drug therapy: Secondary | ICD-10-CM

## 2021-09-02 MED ORDER — PREDNISONE 5 MG PO TABS
5.0000 mg | ORAL_TABLET | Freq: Every day | ORAL | 0 refills | Status: DC | PRN
Start: 2021-09-02 — End: 2021-09-07

## 2021-09-02 MED ORDER — UPADACITINIB ER 15 MG PO TB24: 15.0000 mg | ORAL_TABLET | Freq: Every day | ORAL | 2 refills | Status: DC

## 2021-09-04 LAB — CBC WITH DIFFERENTIAL/PLATELET
Absolute Monocytes: 476 cells/uL (ref 200–950)
Basophils Absolute: 21 cells/uL (ref 0–200)
Basophils Relative: 0.3 %
Eosinophils Absolute: 28 cells/uL (ref 15–500)
Eosinophils Relative: 0.4 %
HCT: 35.5 % (ref 35.0–45.0)
Hemoglobin: 12.1 g/dL (ref 11.7–15.5)
Lymphs Abs: 2435 cells/uL (ref 850–3900)
MCH: 32.2 pg (ref 27.0–33.0)
MCHC: 34.1 g/dL (ref 32.0–36.0)
MCV: 94.4 fL (ref 80.0–100.0)
MPV: 9.3 fL (ref 7.5–12.5)
Monocytes Relative: 6.7 %
Neutro Abs: 4139 cells/uL (ref 1500–7800)
Neutrophils Relative %: 58.3 %
Platelets: 356 10*3/uL (ref 140–400)
RBC: 3.76 10*6/uL — ABNORMAL LOW (ref 3.80–5.10)
RDW: 11.7 % (ref 11.0–15.0)
Total Lymphocyte: 34.3 %
WBC: 7.1 10*3/uL (ref 3.8–10.8)

## 2021-09-04 LAB — COMPLETE METABOLIC PANEL WITH GFR
AG Ratio: 1.1 (calc) (ref 1.0–2.5)
ALT: 17 U/L (ref 6–29)
AST: 18 U/L (ref 10–35)
Albumin: 4 g/dL (ref 3.6–5.1)
Alkaline phosphatase (APISO): 68 U/L (ref 37–153)
BUN: 13 mg/dL (ref 7–25)
CO2: 27 mmol/L (ref 20–32)
Calcium: 8.9 mg/dL (ref 8.6–10.4)
Chloride: 104 mmol/L (ref 98–110)
Creat: 0.63 mg/dL (ref 0.50–1.03)
Globulin: 3.5 g/dL (calc) (ref 1.9–3.7)
Glucose, Bld: 86 mg/dL (ref 65–99)
Potassium: 4 mmol/L (ref 3.5–5.3)
Sodium: 140 mmol/L (ref 135–146)
Total Bilirubin: 0.5 mg/dL (ref 0.2–1.2)
Total Protein: 7.5 g/dL (ref 6.1–8.1)
eGFR: 105 mL/min/{1.73_m2} (ref >=60)

## 2021-09-04 LAB — QUANTIFERON-TB GOLD PLUS
Mitogen-NIL: >10 IU/mL
NIL: 0.07 IU/mL
QuantiFERON-TB Gold Plus: NEGATIVE
TB1-NIL: 0.01 IU/mL
TB2-NIL: 0.04 IU/mL

## 2021-09-04 LAB — SEDIMENTATION RATE

## 2021-09-04 LAB — SED RATE MANUAL WEST RFLX: SED RATE BY MODIFIED WESTERGREN,MANUAL: 53 mm/h — ABNORMAL HIGH (ref 0–30)

## 2021-09-06 ENCOUNTER — Other Ambulatory Visit: Payer: Self-pay | Admitting: Internal Medicine

## 2021-09-06 ENCOUNTER — Other Ambulatory Visit: Payer: Self-pay | Admitting: Family Medicine

## 2021-09-06 DIAGNOSIS — E782 Mixed hyperlipidemia: Secondary | ICD-10-CM

## 2021-09-06 DIAGNOSIS — M069 Rheumatoid arthritis, unspecified: Secondary | ICD-10-CM

## 2021-09-07 NOTE — Progress Notes (Signed)
Blood count and liver function test are fine for continuing rinvoq. Sedimentation rate is 53 consistent with some ongoing inflammation.

## 2021-09-08 ENCOUNTER — Other Ambulatory Visit: Payer: Self-pay | Admitting: *Deleted

## 2021-09-08 MED ORDER — OMEPRAZOLE 20 MG PO CPDR: 20.0000 mg | DELAYED_RELEASE_CAPSULE | Freq: Every day | ORAL | 0 refills | Status: DC

## 2021-09-08 NOTE — Telephone Encounter (Signed)
Please review pended prescription and send to the pharmacy. Thanks!  ?

## 2021-09-08 NOTE — Addendum Note (Signed)
Addended by: Ellen Henri on: 09/08/2021 04:57 PM   Modules accepted: Orders

## 2021-09-08 NOTE — Addendum Note (Signed)
Addended by: Fuller Plan on: 09/08/2021 04:59 PM   Modules accepted: Orders

## 2021-09-08 NOTE — Telephone Encounter (Signed)
Received a fax from [facility] Stating patient is requesting a new Rx for Omeprazole. Please advise.

## 2021-09-08 NOTE — Telephone Encounter (Signed)
Okay to send prescription for omeprazole 20 mg PO daily since she is currently on long term low dose prednisone treatment this would be prophylaxis. Looks like previously prescribed by PCP offices but not currently.

## 2021-09-11 ENCOUNTER — Encounter: Payer: Self-pay | Admitting: *Deleted

## 2021-09-11 NOTE — Progress Notes (Signed)
Error

## 2021-09-16 ENCOUNTER — Ambulatory Visit: Payer: Medicaid Other | Admitting: Student

## 2021-09-16 NOTE — Progress Notes (Deleted)
    SUBJECTIVE:   CHIEF COMPLAINT / HPI:   Asthma -Moderate persistent -Advair 115-21  HTN -amlodipine 10 mg  RA -Tramadol 50 mg  Mammogram??  Vaccines Flu, VZV, PCV PERTINENT  PMH / PSH: ***  OBJECTIVE:   There were no vitals taken for this visit.  ***  ASSESSMENT/PLAN:   No problem-specific Assessment & Plan notes found for this encounter.     Bess Kinds, MD [facility]

## 2021-09-23 ENCOUNTER — Other Ambulatory Visit: Payer: Self-pay | Admitting: Internal Medicine

## 2021-09-23 ENCOUNTER — Other Ambulatory Visit: Payer: Self-pay

## 2021-09-23 DIAGNOSIS — M069 Rheumatoid arthritis, unspecified: Secondary | ICD-10-CM

## 2021-09-23 NOTE — Telephone Encounter (Signed)
Patient called stating she hasn't received her prescription of Rinvoq that is filled through the specialty pharmacy.  Patient requested a return call.

## 2021-09-23 NOTE — Telephone Encounter (Signed)
Attempted to contact the patient and left message for patient to call the office.  

## 2021-09-24 MED ORDER — UPADACITINIB ER 15 MG PO TB24: 15.0000 mg | ORAL_TABLET | Freq: Every day | ORAL | 2 refills | Status: DC

## 2021-09-24 NOTE — Telephone Encounter (Signed)
Attempted to contact the patient to see if she may need a sample and left message for patient to call the office.

## 2021-10-15 ENCOUNTER — Other Ambulatory Visit: Payer: Self-pay | Admitting: Internal Medicine

## 2021-10-15 DIAGNOSIS — M069 Rheumatoid arthritis, unspecified: Secondary | ICD-10-CM

## 2021-10-15 NOTE — Telephone Encounter (Signed)
Next Visit: 12/02/2021  Last Visit: 09/02/2021  Last Fill: 07/21/2021  Current Dose per last prescription 07/21/2021: TAKE 1 TABLET BY MOUTH EVERY 8 HOURS AS NEEDED FOR MUSCLE SPASM  Okay to refill Tizanidine?

## 2021-10-16 ENCOUNTER — Other Ambulatory Visit: Payer: Self-pay | Admitting: Internal Medicine

## 2021-10-16 DIAGNOSIS — M069 Rheumatoid arthritis, unspecified: Secondary | ICD-10-CM

## 2021-10-16 NOTE — Telephone Encounter (Signed)
Attempted to contact the patient and left message for patient to call the office.  

## 2021-10-16 NOTE — Telephone Encounter (Signed)
Patient left message regarding a refill on Prednisone and Tizanidine. Patient states she never received the prescription for the Rinvoq. Tizanidine prescription was sent to the pharmacy this morning. Left message to advise patient Tizanidine prescription was sent to the pharmacy this morning and Rinvoq prescription was sent to speciality pharmacy. Left patient number to contact the pharmacy.   Next Visit: 12/02/2021  Last Visit: 09/02/2021  Last Fill: 09/07/2021  Dx: Rheumatoid arthritis involving multiple sites, unspecified whether rheumatoid factor present   Current Dose per office note on 09/02/2021: continue the 5 mg prednisone for now hopefully can tolerate discontinuing completely once back on that medication.  Okay to refill Prednisone?

## 2021-10-16 NOTE — Telephone Encounter (Addendum)
Patient called the office requesting a refill of Upadacitinib. [facility]

## 2021-10-17 MED ORDER — PREDNISONE 5 MG PO TABS: 5.0000 mg | ORAL_TABLET | Freq: Every day | ORAL | 0 refills | Status: DC

## 2021-10-17 NOTE — Telephone Encounter (Signed)
Thanks

## 2021-10-18 ENCOUNTER — Other Ambulatory Visit: Payer: Self-pay | Admitting: Internal Medicine

## 2021-10-20 ENCOUNTER — Other Ambulatory Visit: Payer: Self-pay | Admitting: Internal Medicine

## 2021-10-20 DIAGNOSIS — M069 Rheumatoid arthritis, unspecified: Secondary | ICD-10-CM

## 2021-10-29 ENCOUNTER — Ambulatory Visit: Payer: Medicaid Other | Admitting: Student

## 2021-10-30 ENCOUNTER — Telehealth: Payer: Self-pay | Admitting: Internal Medicine

## 2021-10-30 ENCOUNTER — Ambulatory Visit (INDEPENDENT_AMBULATORY_CARE_PROVIDER_SITE_OTHER): Payer: Medicaid Other | Admitting: Student

## 2021-10-30 ENCOUNTER — Other Ambulatory Visit: Payer: Self-pay

## 2021-10-30 ENCOUNTER — Encounter: Payer: Self-pay | Admitting: Student

## 2021-10-30 VITALS — BP 111/80 | HR 102 | Ht 64.0 in | Wt 223.6 lb

## 2021-10-30 DIAGNOSIS — Z1231 Encounter for screening mammogram for malignant neoplasm of breast: Secondary | ICD-10-CM

## 2021-10-30 DIAGNOSIS — M069 Rheumatoid arthritis, unspecified: Secondary | ICD-10-CM

## 2021-10-30 DIAGNOSIS — K219 Gastro-esophageal reflux disease without esophagitis: Secondary | ICD-10-CM | POA: Diagnosis not present

## 2021-10-30 DIAGNOSIS — Z23 Encounter for immunization: Secondary | ICD-10-CM | POA: Diagnosis not present

## 2021-10-30 DIAGNOSIS — J4540 Moderate persistent asthma, uncomplicated: Secondary | ICD-10-CM

## 2021-10-30 MED ORDER — MONTELUKAST SODIUM 10 MG PO TABS: 10.0000 mg | ORAL_TABLET | Freq: Every day | ORAL | 2 refills | Status: DC

## 2021-10-30 MED ORDER — ADVAIR HFA 230-21 MCG/ACT IN AERO: 2.0000 | INHALATION_SPRAY | Freq: Two times a day (BID) | RESPIRATORY_TRACT | 12 refills | Status: DC

## 2021-10-30 MED ORDER — SENNOSIDES 8.6 MG PO TABS: ORAL_TABLET | ORAL | 1 refills | Status: AC

## 2021-10-30 NOTE — Addendum Note (Signed)
Addended by: Bess Kinds T on: 10/30/2021 05:57 PM   Modules accepted: Orders

## 2021-10-30 NOTE — Telephone Encounter (Signed)
Patient called the office requesting a refill of [facility] [address] Patient states she also has also still not received Upadacitinib yet from [place].

## 2021-10-30 NOTE — Telephone Encounter (Signed)
Left message to advise patient a prescription for Prednisone was sent to the pharmacy on 10/20/2021 for a 30 day supply. Patient advised [facility] did [facility] her Rinvoq prescription. Provided patient with [facility] so she may set up shipment.

## 2021-10-30 NOTE — Assessment & Plan Note (Signed)
Patient reports recently restarting omeprazole. Notes relief in reflux symptoms. Says she's had no episodes, since restarting medicine before holidays. -Continue Omeparazole

## 2021-10-30 NOTE — Assessment & Plan Note (Signed)
Patient currently experiencing a flare of RA. Hand joints swollen with ulnardeviation at PIP joint. Patient wanting short course of steroids and is also having issue getting one of her RA medicine filled. -Encourage patient to reach out to rheumatologist.

## 2021-10-30 NOTE — Patient Instructions (Signed)
It was great to see you! Thank you for allowing me to participate in your care!  What we discussed today - Persistent asthma symptoms, increasing dose of Advair, also starting montelukast (Singulair) - Pulmonary Function Testing for your Asthma: Schedule this with Dr. Janeann Forehand at front desk. - Acid Reflux well controlled on Omeprazole - RA flare: Call Rheumatologist for short course of prednisone, and to get RA meds that have been having trouble filling - Scheduling Mammogram, someone will call - Flu shot given  Please make follow up appointment to discuss asthma in 1 month, if symptoms have not gotten better. We may need to adjust your medicine again.  Take care and seek immediate care sooner if you develop any concerns.   Dr. Holley Bouche, MD [place]

## 2021-10-30 NOTE — Progress Notes (Signed)
SUBJECTIVE:   CHIEF COMPLAINT / HPI:   F/u on Asthma  Hx of moderate persistent asthma, on advair low dose 115/21  Patient complains of symptoms at night, just cough, non productive. Denies any chest tightness, but does appreciate some wheezing intermittently. Symptoms only happen at night. She is using her Asthma rescue inhaler, albuterol, about 2x a week. She notes that she does get a little short of breath at night with increased activity level. Feels like her symptoms are much improved from last visit. Denies any sick symptoms (fever, nausea, vomiting, diarrhea, belly pain). Says she doesn't feel so well today, because she is having an RA flare up.   RA Flare Patient requesting refill of prednisone, given 5 mg prednisone by rheumatologist for RA flares. Patient also having issues getting RA medicine filled. Told patient I didn't feel comfortable re-filling prednisone, and encouraged her to reach out to rheumatologist. Patient agreeable. Flare bothering hands, feet, and shoulders.  Acid Reflux Taking omeprazole,and now acid reflux is better. No flare ups since being back on ompeparazole. Restarted medication sometime before holidays. No other symptoms.    Mammogram: Last mammogram was Aug 13 2016, patient open to getting one  Flu Shot: Patient wanting Flu shot today    PERTINENT  PMH / PSH: HTN, HLD, Asthma, RA, GERD  OBJECTIVE:   BP 111/80    Pulse (!) 102    Ht  (1.626 m)    Wt 223 lb 9.6 oz (101.4 kg)    SpO2 100%    BMI 38.38 kg/m   Physical Exam Constitutional:      General: She is not in acute distress.    Appearance: Normal appearance.  Cardiovascular:     Rate and Rhythm: Normal rate and regular rhythm.     Pulses: Normal pulses.     Heart sounds: Normal heart sounds. No murmur heard.   No friction rub. No gallop.  Pulmonary:     Effort: Pulmonary effort is normal. No respiratory distress.     Breath sounds: Normal breath sounds. No stridor. No wheezing.   Musculoskeletal:     Right shoulder: Tenderness present. Decreased range of motion.     Left shoulder: Tenderness present.     Right hand: Swelling present. No deformity. Normal range of motion.     Left hand: Swelling present. No deformity. Normal range of motion.     Comments: Patient hands swelling in MCP of first two fingers on both hands. Patient right shoulder limited ROM 2/2 pain, not TTP. Plantar surface of feet sore, not TTP.   Neurological:     Mental Status: She is alert.     ASSESSMENT/PLAN:   Asthma Asthma is better controlled, but patient still suffering from night time symptoms 2-3 x a week. Patient using rescue inhaler 2-3 x a week. Patient without sick symptoms, making infection less likely as etiology of symptoms. Patient also with good lung exam, good work of breathing and air movement diffusely, making asthma exacerbation less likely. Will increase Advair dose and start Singulair -Advair 230-21 2 puffs BID -Montelukast 10 mg daily -Pulmonary Function Test  Gastroesophageal reflux disease without esophagitis Patient reports recently restarting omeprazole. Notes relief in reflux symptoms. Says she's had no episodes, since restarting medicine before holidays. -Continue Omeparazole  Rheumatoid arthritis (HCC) Patient currently experiencing a flare of RA. Hand joints swollen with ulnardeviation at PIP joint. Patient wanting short course of steroids and is also having issue getting one of her RA medicine  filled. -Encourage patient to reach out to rheumatologist.    Health maintenance: Mammogram referral placed Flu shot given  Bess Kinds, MD [facility]

## 2021-10-30 NOTE — Assessment & Plan Note (Addendum)
Asthma is better controlled, but patient still suffering from night time symptoms 2-3 x a week. Patient using rescue inhaler 2-3 x a week. Patient without sick symptoms, making infection less likely as etiology of symptoms. Patient also with good lung exam, good work of breathing and air movement diffusely, making asthma exacerbation less likely. Will increase Advair dose and start Singulair -Advair 230-21 2 puffs BID -Montelukast 10 mg daily -Pulmonary Function Test

## 2021-11-06 ENCOUNTER — Telehealth: Payer: Self-pay

## 2021-11-06 NOTE — Telephone Encounter (Signed)
PA renewal initiated automatically by CoverMyMeds.  Submitted a [facility] via CoverMyMeds. Will update once we receive a response.   Key: [id number]

## 2021-11-06 NOTE — Telephone Encounter (Signed)
Received notification from [facility] regarding a prior authorization for [facility]. Authorization has been APPROVED from 11/06/2021 to 11/18/2022. Approval letter sent to scan center.  Authorization # [id number]

## 2021-11-10 ENCOUNTER — Ambulatory Visit: Payer: Medicaid Other | Admitting: Pharmacist

## 2021-11-12 ENCOUNTER — Telehealth: Payer: Self-pay

## 2021-11-12 NOTE — Telephone Encounter (Signed)
Patient calls nurse line requesting antibiotics for possible bronchitis. Patient states that she has had cough since visit with Dr. Barbaraann Faster on 10/30/2021.  Patient reports that cough is now productive with white sputum. Patient denies fever, runny nose and sore throat.   Advised patient that she may need to be re evaluated prior to abx being prescribed, however, will send message to PCP.   Please advise.   Veronda Prude, RN

## 2021-11-14 NOTE — Telephone Encounter (Signed)
Called patient and scheduled for Monday, 11/17/21 at 9:10.  Veronda PrudeHannah C Michelle Wnek, RN

## 2021-11-16 NOTE — Patient Instructions (Incomplete)
It was wonderful to see you today. ? ?Please bring ALL of your medications with you to every visit.  ? ?Today we talked about: ? ?** ? ? ?Thank you for choosing [facility].  ? ?Please call [phone] with any questions about today's appointment. ? ?Please be sure to schedule follow up at the front  desk before you leave today.  ? ?Joseline Mccampbell, DO ?PGY-2 Family Medicine   ?

## 2021-11-16 NOTE — Progress Notes (Deleted)
° ° °  SUBJECTIVE:   CHIEF COMPLAINT / HPI:   Productive Cough  Kathy [name] is a 56 y.o. female who presents to the [facility] clinic to discuss continued cough.  She last saw PCP on 1/5 and reported improvement from asthma flare.  Since that time, however, patient reports ***   PERTINENT  PMH / PSH: HTN, HLD, GERD   OBJECTIVE:   There were no vitals taken for this visit. ***  General: NAD, pleasant, able to participate in exam Cardiac: RRR, no murmurs. Respiratory: CTAB, normal effort, No wheezes, rales or rhonchi Abdomen: Bowel sounds present, nontender, nondistended, no hepatosplenomegaly. Extremities: no edema or cyanosis. Skin: warm and dry, no rashes noted Neuro: alert, no obvious focal deficits Psych: Normal affect and mood  ASSESSMENT/PLAN:   No problem-specific Assessment & Plan notes found for this encounter.     Sabino Dick, DO [facility]

## 2021-11-17 ENCOUNTER — Ambulatory Visit: Payer: Medicaid Other

## 2021-11-17 ENCOUNTER — Ambulatory Visit: Payer: Medicaid Other | Admitting: Family Medicine

## 2021-11-21 ENCOUNTER — Other Ambulatory Visit: Payer: Self-pay | Admitting: Internal Medicine

## 2021-11-21 DIAGNOSIS — M069 Rheumatoid arthritis, unspecified: Secondary | ICD-10-CM

## 2021-11-21 NOTE — Telephone Encounter (Signed)
Next Visit: 12/02/2021   Last Visit: 09/02/2021   Last Fill: 10/16/2021   Current Dose per last prescription 07/21/2021: TAKE 1 TABLET BY MOUTH EVERY 8 HOURS AS NEEDED FOR MUSCLE SPASM   Okay to refill Tizanidine?

## 2021-12-01 NOTE — Progress Notes (Deleted)
Office Visit Note  Patient: Kathy [name]             Date of Birth: 12-28-1965           MRN: [id number]             PCP: Holley Bouche, MD Referring: Holley Bouche, MD Visit Date: 12/02/2021   Subjective:  No chief complaint on file.   History of Present Illness: Kathy [name] is a 56 y.o. female here for follow up for seropositive RA on rinvoq 15 mg daily and prednisone 5 mg daily. ***   Previous HPI 09/02/21 Kathy [name] is a 56 y.o. female here for follow up for seropositive RA on Rinvoq 15 mg p.o. daily. Recommended trying to taper off the remaining low-dose prednisone if symptoms were well controlled since the last visit. She missed her last appointment due to bronchitis and so is off the rinvoq for at least 2 months. This did not cause a severe flare but she definitely feels a difference and increased prednisone use, She has been taking prednisone 5 mg daily whenever she notices more joint pain than usual often associated with weather changes. She takes 10 mg rarely sometimes in raining weather.    Previous HPI 05/09/21 Kathy [name] is a 56 y.o. female here for follow up for seropositive rheumatoid arthritis on Rinvoq 15 mg daily Plaquenil 200 mg daily and prednisone 5 mg daily.  After her last visit she restarted the Rinvoq due to lapse in treatment for a period of time.  She feels her symptoms are currently well controlled on this regimen and has no complaints.   No Rheumatology ROS completed.   PMFS History:  Patient Active Problem List   Diagnosis Date Noted   Family history of breast cancer 01/08/2021   History of abdominal hysterectomy 11/05/2020   High risk medication use 10/11/2020   Current chronic use of systemic steroids 07/12/2020   Essential hypertension 07/12/2020   Hyperlipidemia 02/22/2020   Gastroesophageal reflux disease without esophagitis 06/15/2019   Asthma    Panic disorder 07/23/2015   Osteoarthritis of acromioclavicular joint  03/15/2015   Anemia 09/18/2014   Rheumatoid arthritis ([place]) 09/15/2009   DEGENERATIVE JOINT DISEASE, GENERALIZED 07/28/2007    Past Medical History:  Diagnosis Date   Asthma    seasonal   GERD (gastroesophageal reflux disease)    Rheumatoid arteritis ([place])     Family History  Problem Relation Age of Onset   Asthma Mother    Diabetes Mother    Hypertension Mother    Heart disease Mother    Asthma Father    Cancer Father        lung   Lung cancer Sister    Cancer Sister        breast at 30   Cancer Brother        unknown   Past Surgical History:  Procedure Laterality Date   ABDOMINAL HYSTERECTOMY     TOTAL KNEE ARTHROPLASTY Right 06/18/2015   Procedure: RIGHT TOTAL KNEE ARTHROPLASTY;  Surgeon: Mcarthur Rossetti, MD;  Location: [facility];  Service: Orthopedics;  Laterality: Right;   TOTAL KNEE ARTHROPLASTY Left 05/11/2017   Procedure: LEFT TOTAL KNEE ARTHROPLASTY;  Surgeon: Mcarthur Rossetti, MD;  Location: [facility];  Service: Orthopedics;  Laterality: Left;   Social History   Social History Narrative   Not on file   Immunization History  Administered Date(s) Administered   Influenza,inj,Quad PF,6+ Mos 09/19/2014, 07/23/2015, 08/04/2018, 07/08/2020,  10/30/2021   Influenza,inj,Quad PF,6-35 Mos 11/16/2017   PFIZER Comirnaty(Gray Top)Covid-19 Tri-Sucrose Vaccine 11/22/2020   PFIZER(Purple Top)SARS-COV-2 Vaccination 02/29/2020, 03/26/2020   Pneumococcal Polysaccharide-23 10/26/2002   Tdap 07/03/2016, 08/04/2018     Objective: Vital Signs: There were no vitals taken for this visit.   Physical Exam   Musculoskeletal Exam: ***  CDAI Exam: CDAI Score: -- Patient Global: --; Provider Global: -- Swollen: --; Tender: -- Joint Exam 12/02/2021   No joint exam has been documented for this visit   There is currently no information documented on the homunculus. Go to the Rheumatology activity and complete the homunculus joint exam.  Investigation: No additional  findings.  Imaging: No results found.  Recent Labs: Lab Results  Component Value Date   WBC 7.1 09/02/2021   HGB 12.1 09/02/2021   PLT 356 09/02/2021   NA 140 09/02/2021   K 4.0 09/02/2021   CL 104 09/02/2021   CO2 27 09/02/2021   GLUCOSE 86 09/02/2021   BUN 13 09/02/2021   CREATININE 0.63 09/02/2021   BILITOT 0.5 09/02/2021   ALKPHOS 75 01/08/2021   AST 18 09/02/2021   ALT 17 09/02/2021   PROT 7.5 09/02/2021   ALBUMIN 4.0 01/08/2021   CALCIUM 8.9 09/02/2021   GFRAA 123 10/11/2020   QFTBGOLDPLUS NEGATIVE 09/02/2021    Speciality Comments: No specialty comments available.  Procedures:  No procedures performed Allergies: Methotrexate derivatives, Peanut-containing drug products, Acyclovir and related, Aspirin, Eggs or egg-derived products, and Tomato   Assessment / Plan:     Visit Diagnoses: No diagnosis found.  ***  Orders: No orders of the defined types were placed in this encounter.  No orders of the defined types were placed in this encounter.    Follow-Up Instructions: No follow-ups on file.   Collier Salina, MD  Note - This record has been created using [employer].  Chart creation errors have been sought, but may not always  have been located. Such creation errors do not reflect on  the standard of medical care.

## 2021-12-02 ENCOUNTER — Ambulatory Visit: Payer: Medicaid Other | Admitting: Internal Medicine

## 2021-12-07 NOTE — Progress Notes (Signed)
Office Visit Note  Patient: Kathy [name]             Date of Birth: [birth year]           MRN: [id number]             PCP: Holley Bouche, MD Referring: Holley Bouche, MD Visit Date: 12/08/2021   Subjective:  Follow-up ([place])   History of Present Illness: Kathy [name] is a 56 y.o. female here for follow up for seropositive RA on rinvoq 15 mg PO daily and prednisone trying to taper off from 5 mg daily. She has felt improvement with resuming rinvoq symptoms are mostly good. She is off the medicine again since about 1 week ago from running out. She takes the 5 mg prednisone one in the morning on days where she has noticeably increased pain most often with cold and weather changes. She has shoulder pain at night also bothering her but not severely limiting any activities.   Previous HPI 09/02/21 Kathy [name] is a 56 y.o. female here for follow up for seropositive RA on Rinvoq 15 mg p.o. daily. Recommended trying to taper off the remaining low-dose prednisone if symptoms were well controlled since the last visit. She missed her last appointment due to bronchitis and so is off the rinvoq for at least 2 months. This did not cause a severe flare but she definitely feels a difference and increased prednisone use, She has been taking prednisone 5 mg daily whenever she notices more joint pain than usual often associated with weather changes. She takes 10 mg rarely sometimes in raining weather.    Previous HPI 05/09/21 Kathy [name] is a 56 y.o. female here for follow up for seropositive rheumatoid arthritis on Rinvoq 15 mg daily Plaquenil 200 mg daily and prednisone 5 mg daily.  After her last visit she restarted the Rinvoq due to lapse in treatment for a period of time.  She feels her symptoms are currently well controlled on this regimen and has no complaints.   Review of Systems  Constitutional:  Positive for fatigue.  HENT:  Negative for mouth dryness.   Eyes:  Negative for  dryness.  Respiratory:  Negative for shortness of breath.   Cardiovascular:  Negative for swelling in legs/feet.  Gastrointestinal:  Negative for constipation.  Endocrine: Negative for excessive thirst.  Genitourinary:  Negative for difficulty urinating.  Musculoskeletal:  Positive for joint pain, joint pain, muscle weakness, morning stiffness and muscle tenderness.  Skin:  Negative for rash.  Allergic/Immunologic: Negative for susceptible to infections.  Neurological:  Negative for numbness.  Hematological:  Negative for bruising/bleeding tendency.  Psychiatric/Behavioral:  Negative for sleep disturbance.    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   Anemia 09/18/2014   Rheumatoid arthritis ([place]) 09/15/2009   DEGENERATIVE JOINT DISEASE, GENERALIZED 07/28/2007    Past Medical History:  Diagnosis Date   Asthma    seasonal   GERD (gastroesophageal reflux disease)    Rheumatoid arteritis ([place])     Family History  Problem Relation Age of Onset   Asthma Mother  Diabetes Mother    Hypertension Mother    Heart disease Mother    Asthma Father    Cancer Father        lung   Lung cancer Sister    Cancer Sister        breast at 43   Cancer Brother        unknown   Past Surgical History:  Procedure Laterality Date   ABDOMINAL HYSTERECTOMY     TOTAL KNEE ARTHROPLASTY Right 06/18/2015   Procedure: RIGHT TOTAL KNEE ARTHROPLASTY;  Surgeon: Mcarthur Rossetti, MD;  Location: [facility];  Service: Orthopedics;  Laterality: Right;   TOTAL KNEE ARTHROPLASTY  Left 05/11/2017   Procedure: LEFT TOTAL KNEE ARTHROPLASTY;  Surgeon: Mcarthur Rossetti, MD;  Location: [facility];  Service: Orthopedics;  Laterality: Left;   Social History   Social History Narrative   Not on file   Immunization History  Administered Date(s) Administered   Influenza,inj,Quad PF,6+ Mos 09/19/2014, 07/23/2015, 08/04/2018, 07/08/2020, 10/30/2021   Influenza,inj,Quad PF,6-35 Mos 11/16/2017   PFIZER Comirnaty(Gray Top)Covid-19 Tri-Sucrose Vaccine 11/22/2020   PFIZER(Purple Top)SARS-COV-2 Vaccination 02/29/2020, 03/26/2020   Pneumococcal Polysaccharide-23 10/26/2002   Tdap 07/03/2016, 08/04/2018     Objective: Vital Signs: BP 133/80 (BP Location: Left Arm, Patient Position: Sitting, Cuff Size: Normal)    Pulse (!) 118    Resp 16    Ht 5\' 3"  (1.6 m)    Wt 217 lb (98.4 kg)    BMI 38.44 kg/m    Physical Exam Constitutional:      Appearance: She is obese.  Cardiovascular:     Rate and Rhythm: Normal rate and regular rhythm.  Pulmonary:     Effort: Pulmonary effort is normal.     Breath sounds: Normal breath sounds.  Musculoskeletal:     Right lower leg: No edema.     Left lower leg: No edema.  Skin:    General: Skin is warm and dry.  Neurological:     Mental Status: She is alert.     Musculoskeletal Exam:  Neck full ROM no tenderness Shoulders decreased abduction and external rotation ROM worse on right side, tenderness no palpable swelling or warmth Elbows severely reduced ROM on right left better but decreased in extension mostly Wrists minimal ROM, no tenderness or swelling Fingers with chrpnic subluxation, reversible hyperextension worst in right 2nd PIP and in several left PIPs Knees full ROM no tenderness or swelling Ankles full ROM no tenderness or swelling MTPs cocked up deformities, mild tenderness to pressure over midfoot plantar and medial sides   CDAI Exam: CDAI Score: 5  Patient Global: 20 mm; Provider Global: 10 mm Swollen: 0 ; Tender: 2   Joint Exam 12/08/2021      Right  Left  Glenohumeral   Tender   Tender     Investigation: No additional findings.  Imaging: No results found.  Recent Labs: Lab Results  Component Value Date   WBC 6.9 12/08/2021   HGB 11.3 (L) 12/08/2021   PLT 357 12/08/2021   NA 142 12/08/2021   K 3.8 12/08/2021   CL 103 12/08/2021   CO2 24 12/08/2021   GLUCOSE 104 (H) 12/08/2021   BUN 14 12/08/2021   CREATININE 0.64 12/08/2021   BILITOT 0.5 12/08/2021   ALKPHOS 75 01/08/2021   AST 19 12/08/2021   ALT 14 12/08/2021   PROT 8.1 12/08/2021   ALBUMIN 4.0 01/08/2021   CALCIUM 9.5 12/08/2021   GFRAA 123 10/11/2020   QFTBGOLDPLUS NEGATIVE 09/02/2021  Speciality Comments: No specialty comments available.  Procedures:  No procedures performed Allergies: Methotrexate derivatives, Peanut-containing drug products, Acyclovir and related, Aspirin, Eggs or egg-derived products, and Tomato   Assessment / Plan:     Visit Diagnoses: Rheumatoid arthritis involving multiple sites, unspecified whether rheumatoid factor present ([place]) - Plan: Sedimentation rate, predniSONE (DELTASONE) 5 MG tablet, Upadacitinib ER 15 MG TB24  Symptoms doing much better with the rinvoq she keeps having treatment interruptions which are causing symptoms. Taking the prednisone generally 5 mg or less daily. Rechecking sed rate hers has been staying high so far despite improvement in inflammatory changes and symptoms. Plan to continue rinvoq 15 mg daily and prednisone 5 mg daily PRN.  Osteoarthritis of acromioclavicular joint  Not sure how much inflammation versus underlying joint OA causing the current pain. No obvious effusions warmth or point tenderness on exam today.  High risk medication use - Plan: CBC with Differential/Platelet, COMPLETE METABOLIC PANEL WITH GFR  Checking CBC and CMP today for rinvoq medication monitoring.  Orders: Orders Placed This Encounter  Procedures   Sedimentation rate   CBC with  Differential/Platelet   COMPLETE METABOLIC PANEL WITH GFR   Meds ordered this encounter  Medications   predniSONE (DELTASONE) 5 MG tablet    Sig: Take 1 tablet (5 mg total) by mouth daily as needed.    Dispense:  30 tablet    Refill:  1   Upadacitinib ER 15 MG TB24    Sig: Take 15 mg by mouth daily.    Dispense:  30 tablet    Refill:  3     Follow-Up Instructions: Return in about 3 months (around 03/07/2022) for RA on UPA/GC PRN f/u 53mos.   Collier Salina, MD  Note - This record has been created using [employer].  Chart creation errors have been sought, but may not always  have been located. Such creation errors do not reflect on  the standard of medical care.

## 2021-12-08 ENCOUNTER — Encounter: Payer: Self-pay | Admitting: Internal Medicine

## 2021-12-08 ENCOUNTER — Ambulatory Visit: Payer: Medicaid Other | Admitting: Internal Medicine

## 2021-12-08 ENCOUNTER — Other Ambulatory Visit: Payer: Self-pay

## 2021-12-08 VITALS — BP 133/80 | HR 118 | Resp 16 | Ht 63.0 in | Wt 217.0 lb

## 2021-12-08 DIAGNOSIS — M19019 Primary osteoarthritis, unspecified shoulder: Secondary | ICD-10-CM

## 2021-12-08 DIAGNOSIS — M069 Rheumatoid arthritis, unspecified: Secondary | ICD-10-CM

## 2021-12-08 DIAGNOSIS — Z79899 Other long term (current) drug therapy: Secondary | ICD-10-CM | POA: Diagnosis not present

## 2021-12-08 MED ORDER — PREDNISONE 5 MG PO TABS: 5.0000 mg | ORAL_TABLET | Freq: Every day | ORAL | 1 refills | Status: DC | PRN

## 2021-12-08 MED ORDER — UPADACITINIB ER 15 MG PO TB24: 15.0000 mg | ORAL_TABLET | Freq: Every day | ORAL | 3 refills | Status: DC

## 2021-12-09 LAB — COMPLETE METABOLIC PANEL WITH GFR
AG Ratio: 1.2 (calc) (ref 1.0–2.5)
ALT: 14 U/L (ref 6–29)
AST: 19 U/L (ref 10–35)
Albumin: 4.4 g/dL (ref 3.6–5.1)
Alkaline phosphatase (APISO): 61 U/L (ref 37–153)
BUN: 14 mg/dL (ref 7–25)
CO2: 24 mmol/L (ref 20–32)
Calcium: 9.5 mg/dL (ref 8.6–10.4)
Chloride: 103 mmol/L (ref 98–110)
Creat: 0.64 mg/dL (ref 0.50–1.03)
Globulin: 3.7 g/dL (calc) (ref 1.9–3.7)
Glucose, Bld: 104 mg/dL — ABNORMAL HIGH (ref 65–99)
Potassium: 3.8 mmol/L (ref 3.5–5.3)
Sodium: 142 mmol/L (ref 135–146)
Total Bilirubin: 0.5 mg/dL (ref 0.2–1.2)
Total Protein: 8.1 g/dL (ref 6.1–8.1)
eGFR: 104 mL/min/{1.73_m2} (ref >=60)

## 2021-12-09 LAB — CBC WITH DIFFERENTIAL/PLATELET
Absolute Monocytes: 269 cells/uL (ref 200–950)
Basophils Absolute: 28 cells/uL (ref 0–200)
Basophils Relative: 0.4 %
Eosinophils Absolute: 41 cells/uL (ref 15–500)
Eosinophils Relative: 0.6 %
HCT: 33.5 % — ABNORMAL LOW (ref 35.0–45.0)
Hemoglobin: 11.3 g/dL — ABNORMAL LOW (ref 11.7–15.5)
Lymphs Abs: 1456 cells/uL (ref 850–3900)
MCH: 30.7 pg (ref 27.0–33.0)
MCHC: 33.7 g/dL (ref 32.0–36.0)
MCV: 91 fL (ref 80.0–100.0)
MPV: 9.8 fL (ref 7.5–12.5)
Monocytes Relative: 3.9 %
Neutro Abs: 5106 cells/uL (ref 1500–7800)
Neutrophils Relative %: 74 %
Platelets: 357 10*3/uL (ref 140–400)
RBC: 3.68 10*6/uL — ABNORMAL LOW (ref 3.80–5.10)
RDW: 12.9 % (ref 11.0–15.0)
Total Lymphocyte: 21.1 %
WBC: 6.9 10*3/uL (ref 3.8–10.8)

## 2021-12-09 LAB — SEDIMENTATION RATE: Sed Rate: >130 mm/h — ABNORMAL HIGH (ref 0–30)

## 2021-12-09 NOTE — Progress Notes (Signed)
Lab results look fine for continuing rinvoq. Her sedimentation rate remains very high but no change in treatment needed right now. We can check updated xrays when following up to monitor if there is any ongoing joint damage.

## 2021-12-24 ENCOUNTER — Other Ambulatory Visit: Payer: Self-pay | Admitting: Internal Medicine

## 2021-12-24 DIAGNOSIS — M069 Rheumatoid arthritis, unspecified: Secondary | ICD-10-CM

## 2022-01-22 ENCOUNTER — Other Ambulatory Visit: Payer: Self-pay | Admitting: Internal Medicine

## 2022-01-22 DIAGNOSIS — M069 Rheumatoid arthritis, unspecified: Secondary | ICD-10-CM

## 2022-01-22 NOTE — Telephone Encounter (Signed)
Next Visit: 03/10/2022 ? ?Last Visit: 12/08/2021 ? ?Last Fill: 12/24/2021 ? ?Dx: Rheumatoid arthritis involving multiple sites, unspecified whether rheumatoid factor present  ? ?Current Dose per office note on 12/08/2021: not discussed ? ?Okay to refill Tizanidine?   ?

## 2022-02-03 ENCOUNTER — Other Ambulatory Visit: Payer: Self-pay

## 2022-02-03 DIAGNOSIS — F410 Panic disorder [episodic paroxysmal anxiety] without agoraphobia: Secondary | ICD-10-CM

## 2022-02-03 MED ORDER — FOLIC ACID 1 MG PO TABS: 1.0000 mg | ORAL_TABLET | Freq: Every day | ORAL | 1 refills | Status: DC

## 2022-02-03 MED ORDER — CITALOPRAM HYDROBROMIDE 40 MG PO TABS: ORAL_TABLET | ORAL | 2 refills | Status: DC

## 2022-02-12 ENCOUNTER — Ambulatory Visit: Payer: Medicaid Other | Admitting: Student

## 2022-02-12 ENCOUNTER — Ambulatory Visit (INDEPENDENT_AMBULATORY_CARE_PROVIDER_SITE_OTHER): Payer: Medicaid Other | Admitting: Family Medicine

## 2022-02-12 DIAGNOSIS — J069 Acute upper respiratory infection, unspecified: Secondary | ICD-10-CM | POA: Diagnosis not present

## 2022-02-12 MED ORDER — OMEPRAZOLE 40 MG PO CPDR: 40.0000 mg | DELAYED_RELEASE_CAPSULE | Freq: Every day | ORAL | 3 refills | Status: DC

## 2022-02-12 NOTE — Patient Instructions (Signed)
Thank you for coming to see me today. It was a pleasure. Today we discussed your cough. I think it is a viral infection but I would like to get a xray to check your lungs for pneumonia. ? ?Take tylenol, honey, cough syrup, rest and fluids ? ?I have placed an order for your CHEST XRAY.  Please call [facility] at [phone] to schedule your appointment within one week.  ? ?Please follow-up with PCP as needed  ? ?If you have any questions or concerns, please do not hesitate to call the office at [phone]. ? ?Best wishes,  ? ?Dr Posey Pronto   ?

## 2022-02-12 NOTE — Progress Notes (Signed)
? ? ? ?  SUBJECTIVE:  ? ?CHIEF COMPLAINT / HPI:  ? ?Kathy [name] is a 56 y.o. female presents for cough ? ?Primary symptom: cough productive-white ?Duration: 5 days  ?Severity: severe worse at night  ?Associated symptoms: none ?Fever? Tmax?: no ?Sick contacts:no ?Covid test: no  ?Covid vaccination(s):yes  ?Non smoker  ?Has tried cough medicine, mucinex etc. Eating and drinking well. Denies chest pain, palpitations, dizziness, dyspnea or fevers  ? ?Flowsheet Row Office Visit from 02/12/2022 in [facility]  ?PHQ-9 Total Score 2  ? ?  ?  ?PERTINENT  PMH / PSH: HTN, asthma  ? ?OBJECTIVE:  ? ?BP 111/71   Pulse (!) 110   Wt 218 lb 6.4 oz (99.1 kg)   SpO2 97%   BMI 38.69 kg/m?   ? ?General: Alert, no acute distress ?HEENT: NCAT, no pharyngeal edema or erythema, no lympadenopathy  ?Cardio: Normal S1 and S2, RRR, no r/m/g ?Pulm: CTAB, normal work of breathing ?Abdomen: Bowel sounds normal. Abdomen soft and non-tender.  ?Extremities: No peripheral edema.  ?Neuro: Cranial nerves grossly intact  ? ?ASSESSMENT/PLAN:  ? ?URI (upper respiratory infection) ?Likely viral URI. Normal physical exam and vital signs which is reassuring. Considered flu, COVID and PNA however lower on differential. Recommended supportive care including rest, fluids, OTC remedies and tylenol PRN. Follow up with PCP if no improvement in symptoms. ?  ? ?Towanda Octave, MD PGY-3 ?[facility]   ?

## 2022-02-13 DIAGNOSIS — J069 Acute upper respiratory infection, unspecified: Secondary | ICD-10-CM | POA: Insufficient documentation

## 2022-02-15 NOTE — Assessment & Plan Note (Signed)
Likely viral URI. Normal physical exam and vital signs which is reassuring. Considered flu, COVID and PNA however lower on differential. Recommended supportive care including rest, fluids, OTC remedies and tylenol PRN. Follow up with PCP if no improvement in symptoms. ?

## 2022-02-17 ENCOUNTER — Other Ambulatory Visit: Payer: Self-pay | Admitting: Family Medicine

## 2022-02-17 DIAGNOSIS — R059 Cough, unspecified: Secondary | ICD-10-CM

## 2022-02-21 ENCOUNTER — Other Ambulatory Visit: Payer: Self-pay | Admitting: Internal Medicine

## 2022-02-21 DIAGNOSIS — M069 Rheumatoid arthritis, unspecified: Secondary | ICD-10-CM

## 2022-02-24 ENCOUNTER — Ambulatory Visit
Admission: RE | Admit: 2022-02-24 | Discharge: 2022-02-24 | Disposition: A | Discharge status: Home / Self Care | Payer: Medicaid Other | Source: Ambulatory Visit | Attending: Family Medicine | Admitting: Family Medicine

## 2022-02-24 DIAGNOSIS — R0609 Other forms of dyspnea: Secondary | ICD-10-CM | POA: Diagnosis not present

## 2022-02-24 DIAGNOSIS — R059 Cough, unspecified: Secondary | ICD-10-CM

## 2022-02-27 ENCOUNTER — Other Ambulatory Visit: Payer: Self-pay | Admitting: Student

## 2022-02-27 ENCOUNTER — Other Ambulatory Visit: Payer: Self-pay | Admitting: Family Medicine

## 2022-02-27 ENCOUNTER — Other Ambulatory Visit: Payer: Self-pay | Admitting: Internal Medicine

## 2022-02-27 DIAGNOSIS — M069 Rheumatoid arthritis, unspecified: Secondary | ICD-10-CM

## 2022-02-27 NOTE — Telephone Encounter (Signed)
Next Visit: 03/10/2022 ? ?Last Visit: 12/08/2021 ? ?Last Fill: 12/08/2021 ? ?Dx: Rheumatoid arthritis involving multiple sites, unspecified whether rheumatoid factor present  ? ?Current Dose per office note on 12/08/2021: prednisone 5 mg daily PRN. ? ?Okay to refill prednisone?  ? ?

## 2022-03-01 MED ORDER — PREDNISONE 5 MG PO TABS: 5.0000 mg | ORAL_TABLET | Freq: Every day | ORAL | 1 refills | Status: DC | PRN

## 2022-03-02 NOTE — Progress Notes (Deleted)
Office Visit Note  Patient: Kathy [name]             Date of Birth: [birth year]           MRN: [id number]             PCP: Bess Kinds, MD Referring: Bess Kinds, MD Visit Date: 03/10/2022   Subjective:  No chief complaint on file.   History of Present Illness: Kathy [name] is a 56 y.o. female here for follow up for seropositive RA on rinvoq 15 mg PO daily and prednisone trying to taper off from 5 mg daily.    Previous HPI 12/08/2021 Kathy [name] is a 56 y.o. female here for follow up for seropositive RA on rinvoq 15 mg PO daily and prednisone trying to taper off from 5 mg daily. She has felt improvement with resuming rinvoq symptoms are mostly good. She is off the medicine again since about 1 week ago from running out. She takes the 5 mg prednisone one in the morning on days where she has noticeably increased pain most often with cold and weather changes. She has shoulder pain at night also bothering her but not severely limiting any activities.    Previous HPI 09/02/21 Kathy [name] is a 56 y.o. female here for follow up for seropositive RA on Rinvoq 15 mg p.o. daily. Recommended trying to taper off the remaining low-dose prednisone if symptoms were well controlled since the last visit. She missed her last appointment due to bronchitis and so is off the rinvoq for at least 2 months. This did not cause a severe flare but she definitely feels a difference and increased prednisone use, She has been taking prednisone 5 mg daily whenever she notices more joint pain than usual often associated with weather changes. She takes 10 mg rarely sometimes in raining weather.    Previous HPI 05/09/21 Kathy [name] is a 56 y.o. female here for follow up for seropositive rheumatoid arthritis on Rinvoq 15 mg daily Plaquenil 200 mg daily and prednisone 5 mg daily.  After her last visit she restarted the Rinvoq due to lapse in treatment for a period of time.  She feels her symptoms  are currently well controlled on this regimen and has no complaints.     No Rheumatology ROS completed.   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   Anemia 09/18/2014   Rheumatoid arthritis ([facility]) 09/15/2009   DEGENERATIVE JOINT DISEASE, GENERALIZED 07/28/2007    Past Medical History:  Diagnosis Date   Asthma    seasonal   GERD (gastroesophageal reflux disease)    Rheumatoid arteritis ([facility])     Family History  Problem Relation Age of Onset   Asthma Mother    Diabetes Mother    Hypertension Mother    Heart disease Mother    Asthma Father    Cancer Father        lung   Lung cancer Sister    Cancer Sister        breast at 60  Cancer Brother        unknown   Past Surgical History:  Procedure Laterality Date   ABDOMINAL HYSTERECTOMY     TOTAL KNEE ARTHROPLASTY Right 06/18/2015   Procedure: RIGHT TOTAL KNEE ARTHROPLASTY;  Surgeon: Kathryne Hitch, MD;  Location: [facility] OR;  Service: Orthopedics;  Laterality: Right;   TOTAL KNEE ARTHROPLASTY Left 05/11/2017   Procedure: LEFT TOTAL KNEE ARTHROPLASTY;  Surgeon: Kathryne Hitch, MD;  Location: [facility] OR;  Service: Orthopedics;  Laterality: Left;   Social History   Social History Narrative   Not on file   Immunization History  Administered Date(s) Administered   Influenza,inj,Quad PF,6+ Mos 09/19/2014, 07/23/2015, 08/04/2018, 07/08/2020, 10/30/2021   Influenza,inj,Quad PF,6-35 Mos 11/16/2017   PFIZER Comirnaty(Gray Top)Covid-19 Tri-Sucrose  Vaccine 11/22/2020   PFIZER(Purple Top)SARS-COV-2 Vaccination 02/29/2020, 03/26/2020   Pneumococcal Polysaccharide-23 10/26/2002   Tdap 07/03/2016, 08/04/2018     Objective: Vital Signs: There were no vitals taken for this visit.   Physical Exam   Musculoskeletal Exam: ***  CDAI Exam: CDAI Score: -- Patient Global: --; Provider Global: -- Swollen: --; Tender: -- Joint Exam 03/10/2022   No joint exam has been documented for this visit   There is currently no information documented on the homunculus. Go to the Rheumatology activity and complete the homunculus joint exam.  Investigation: No additional findings.  Imaging: DG Chest 2 View  Result Date: 02/24/2022 CLINICAL DATA:  Productive cough and dyspnea on exertion for 3 weeks. EXAM: CHEST - 2 VIEW COMPARISON:  05/17/2021 FINDINGS: The heart size and mediastinal contours are within normal limits. Both lungs are clear. The visualized skeletal structures are unremarkable. IMPRESSION: No active cardiopulmonary disease. Electronically Signed   By: Danae Orleans M.D.   On: 02/24/2022 16:53    Recent Labs: Lab Results  Component Value Date   WBC 6.9 12/08/2021   HGB 11.3 (L) 12/08/2021   PLT 357 12/08/2021   NA 142 12/08/2021   K 3.8 12/08/2021   CL 103 12/08/2021   CO2 24 12/08/2021   GLUCOSE 104 (H) 12/08/2021   BUN 14 12/08/2021   CREATININE 0.64 12/08/2021   BILITOT 0.5 12/08/2021   ALKPHOS 75 01/08/2021   AST 19 12/08/2021   ALT 14 12/08/2021   PROT 8.1 12/08/2021   ALBUMIN 4.0 01/08/2021   CALCIUM 9.5 12/08/2021   GFRAA 123 10/11/2020   QFTBGOLDPLUS NEGATIVE 09/02/2021    Speciality Comments: No specialty comments available.  Procedures:  No procedures performed Allergies: Methotrexate derivatives, Peanut-containing drug products, Acyclovir and related, Aspirin, Eggs or egg-derived products, and Tomato   Assessment / Plan:     Visit Diagnoses: No diagnosis found.  ***  Orders: No orders of the defined  types were placed in this encounter.  No orders of the defined types were placed in this encounter.    Follow-Up Instructions: No follow-ups on file.   Ellen Henri, CMA  Note - This record has been created using [employer].  Chart creation errors have been sought, but may not always  have been located. Such creation errors do not reflect on  the standard of medical care.

## 2022-03-04 ENCOUNTER — Other Ambulatory Visit: Payer: Self-pay

## 2022-03-04 DIAGNOSIS — I10 Essential (primary) hypertension: Secondary | ICD-10-CM

## 2022-03-05 MED ORDER — AMLODIPINE BESYLATE 10 MG PO TABS: 10.0000 mg | ORAL_TABLET | Freq: Every day | ORAL | 3 refills | Status: DC

## 2022-03-10 ENCOUNTER — Ambulatory Visit: Payer: Medicaid Other | Admitting: Student

## 2022-03-10 ENCOUNTER — Ambulatory Visit: Payer: Medicaid Other | Admitting: Internal Medicine

## 2022-03-10 DIAGNOSIS — M069 Rheumatoid arthritis, unspecified: Secondary | ICD-10-CM

## 2022-03-10 DIAGNOSIS — Z79899 Other long term (current) drug therapy: Secondary | ICD-10-CM

## 2022-03-10 DIAGNOSIS — M19019 Primary osteoarthritis, unspecified shoulder: Secondary | ICD-10-CM

## 2022-03-10 NOTE — Progress Notes (Deleted)
  SUBJECTIVE:   CHIEF COMPLAINT / HPI:   Cough: Patient seen last month for similar symptoms, thought to have URI virus.   PERTINENT  PMH / PSH: Asthma, GERD, RA  Past Medical History:  Diagnosis Date   Asthma    seasonal   GERD (gastroesophageal reflux disease)    Rheumatoid arteritis (HCC)     Past Surgical History:  Procedure Laterality Date   ABDOMINAL HYSTERECTOMY     TOTAL KNEE ARTHROPLASTY Right 06/18/2015   Procedure: RIGHT TOTAL KNEE ARTHROPLASTY;  Surgeon: Kathryne Hitch, MD;  Location: [facility] OR;  Service: Orthopedics;  Laterality: Right;   TOTAL KNEE ARTHROPLASTY Left 05/11/2017   Procedure: LEFT TOTAL KNEE ARTHROPLASTY;  Surgeon: Kathryne Hitch, MD;  Location: [facility] OR;  Service: Orthopedics;  Laterality: Left;    OBJECTIVE:  There were no vitals taken for this visit.  General: NAD, pleasant, able to participate in exam Cardiac: RRR, no murmurs auscultated. Respiratory: CTAB, normal effort, no wheezes, rales or rhonchi Abdomen: soft, non-tender, non-distended, normoactive bowel sounds Extremities: warm and well perfused, no edema or cyanosis. Skin: warm and dry, no rashes noted Neuro: alert, no obvious focal deficits, speech normal Psych: Normal affect and mood  ASSESSMENT/PLAN:  No problem-specific Assessment & Plan notes found for this encounter.   No orders of the defined types were placed in this encounter.  No orders of the defined types were placed in this encounter.  No follow-ups on file. @ {    This will disappear when note is signed, click to select method of visit    :1}

## 2022-03-10 NOTE — Patient Instructions (Incomplete)
It was great to see you! Thank you for allowing me to participate in your care!  I recommend that you always bring your medications to each appointment as this makes it easy to ensure we are on the correct medications and helps us not miss when refills are needed.  Our plans for today:  - *** -   We are checking some labs today, I will call you if they are abnormal will send you a MyChart message or a letter if they are normal.  If you do not hear about your labs in the next 2 weeks please let us know.***  Take care and seek immediate care sooner if you develop any concerns.   Dr. Cacie Gaskins, MD [facility]  

## 2022-03-11 NOTE — Progress Notes (Signed)
Office Visit Note  Patient: Kathy [name]             Date of Birth: 1966-08-30           MRN: [id number]             PCP: Bess Kinds, MD Referring: Bess Kinds, MD Visit Date: 03/12/2022   Subjective:  Rheumatoid Arthritis (Aching)   History of Present Illness: Kathy [name] is a 56 y.o. female here for follow up for seropositive RA on rinvoq 15 mg PO daily and prednisone 5 mg daily.  She has increased pain and stiffness in her left shoulder she notices this more with weather changes and sometimes takes prednisone up to twice daily for this. She is off rinvoq again about 2 weeks due to lack of refills.  Previous HPI 12/08/2021 Kathy [name] is a 56 y.o. female here for follow up for seropositive RA on rinvoq 15 mg PO daily and prednisone trying to taper off from 5 mg daily. She has felt improvement with resuming rinvoq symptoms are mostly good. She is off the medicine again since about 1 week ago from running out. She takes the 5 mg prednisone one in the morning on days where she has noticeably increased pain most often with cold and weather changes. She has shoulder pain at night also bothering her but not severely limiting any activities.    Previous HPI 09/02/21 Kathy [name] is a 56 y.o. female here for follow up for seropositive RA on Rinvoq 15 mg p.o. daily. Recommended trying to taper off the remaining low-dose prednisone if symptoms were well controlled since the last visit. She missed her last appointment due to bronchitis and so is off the rinvoq for at least 2 months. This did not cause a severe flare but she definitely feels a difference and increased prednisone use, She has been taking prednisone 5 mg daily whenever she notices more joint pain than usual often associated with weather changes. She takes 10 mg rarely sometimes in raining weather.    Previous HPI 05/09/21 Kathy [name] is a 56 y.o. female here for follow up for seropositive rheumatoid  arthritis on Rinvoq 15 mg daily Plaquenil 200 mg daily and prednisone 5 mg daily.  After her last visit she restarted the Rinvoq due to lapse in treatment for a period of time.  She feels her symptoms are currently well controlled on this regimen and has no complaints.   Review of Systems  Constitutional:  Positive for fatigue.  HENT:  Negative for mouth dryness.   Eyes:  Negative for dryness.  Respiratory:  Positive for shortness of breath.   Cardiovascular:  Negative for swelling in legs/feet.  Gastrointestinal:  Negative for constipation.  Endocrine: Positive for cold intolerance and heat intolerance.  Genitourinary:  Negative for difficulty urinating.  Musculoskeletal:  Positive for joint pain, joint pain, muscle weakness, morning stiffness and muscle tenderness.  Skin:  Negative for rash.  Allergic/Immunologic: Negative for susceptible to infections.  Neurological:  Negative for numbness.  Hematological:  Negative for bruising/bleeding tendency.  Psychiatric/Behavioral:  Negative for sleep disturbance.    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   Anemia 09/18/2014   Rheumatoid arthritis (HCC) 09/15/2009   DEGENERATIVE JOINT DISEASE, GENERALIZED 07/28/2007    Past Medical History:  Diagnosis Date   Asthma    seasonal   GERD (gastroesophageal reflux disease)    Rheumatoid arteritis (HCC)     Family History  Problem Relation Age of Onset   Asthma Mother    Diabetes Mother    Hypertension Mother     Heart disease Mother    Asthma Father    Cancer Father        lung   Lung cancer Sister    Cancer Sister        breast at 2730   Cancer Brother        unknown   Past Surgical History:  Procedure Laterality Date   ABDOMINAL HYSTERECTOMY     TOTAL KNEE ARTHROPLASTY Right 06/18/2015   Procedure: RIGHT TOTAL KNEE ARTHROPLASTY;  Surgeon: Kathryne Hitchhristopher Y Blackman, MD;  Location: [facility] OR;  Service: Orthopedics;  Laterality: Right;   TOTAL KNEE ARTHROPLASTY Left 05/11/2017   Procedure: LEFT TOTAL KNEE ARTHROPLASTY;  Surgeon: Kathryne HitchBlackman, Melesa Lecy Y, MD;  Location: [facility] OR;  Service: Orthopedics;  Laterality: Left;   Social History   Social History Narrative   Not on file   Immunization History  Administered Date(s) Administered   Influenza,inj,Quad PF,6+ Mos 09/19/2014, 07/23/2015, 08/04/2018, 07/08/2020, 10/30/2021   Influenza,inj,Quad PF,6-35 Mos 11/16/2017   PFIZER Comirnaty(Gray Top)Covid-19 Tri-Sucrose Vaccine 11/22/2020   PFIZER(Purple Top)SARS-COV-2 Vaccination 02/29/2020, 03/26/2020   Pneumococcal Polysaccharide-23 10/26/2002   Tdap 07/03/2016, 08/04/2018     Objective: Vital Signs: BP 124/80 (BP Location: Left Arm, Patient Position: Sitting, Cuff Size: Normal)   Pulse (!) 106   Resp 16   Ht 5\' 3"  (1.6 m)   Wt 216 lb (98 kg)   BMI 38.26 kg/m    Physical Exam Constitutional:      Appearance: She is obese.  Eyes:     Conjunctiva/sclera: Conjunctivae normal.  Cardiovascular:     Rate and Rhythm: Normal rate and regular rhythm.  Pulmonary:     Effort: Pulmonary effort is normal.     Breath sounds: Normal breath sounds.  Musculoskeletal:     Right lower leg: No edema.     Left lower leg: No edema.  Skin:    General: Skin is warm and dry.     Findings: No rash.  Neurological:     Mental Status: She is alert.  Psychiatric:        Mood and Affect: Mood normal.     Musculoskeletal Exam: Neck full ROM no tenderness Left shoulder pain with full abduction otherwise ROM  intact, no focal tenderness to pressure Elbows reduced ROM worse in extension Wrists minimal ROM, no tenderness or swelling Fingers with chrpnic subluxation, reversible hyperextension worst in right 2nd PIP and in several left PIPs Knees full ROM no tenderness or swelling Ankles full ROM no tenderness or swelling  CDAI Exam: CDAI Score: 5  Patient Global: 20 mm; Provider Global: 20 mm Swollen: 0 ; Tender: 1  Joint Exam 03/12/2022      Right  Left  Glenohumeral      Tender     Investigation: No additional findings.  Imaging: DG Chest 2 View  Result Date: 02/24/2022 CLINICAL DATA:  Productive cough and dyspnea on exertion for 3 weeks. EXAM: CHEST - 2 VIEW  COMPARISON:  05/17/2021 FINDINGS: The heart size and mediastinal contours are within normal limits. Both lungs are clear. The visualized skeletal structures are unremarkable. IMPRESSION: No active cardiopulmonary disease. Electronically Signed   By: Danae Orleans M.D.   On: 02/24/2022 16:53    Recent Labs: Lab Results  Component Value Date   WBC 6.9 12/08/2021   HGB 11.3 (L) 12/08/2021   PLT 357 12/08/2021   NA 142 12/08/2021   K 3.8 12/08/2021   CL 103 12/08/2021   CO2 24 12/08/2021   GLUCOSE 104 (H) 12/08/2021   BUN 14 12/08/2021   CREATININE 0.64 12/08/2021   BILITOT 0.5 12/08/2021   ALKPHOS 75 01/08/2021   AST 19 12/08/2021   ALT 14 12/08/2021   PROT 8.1 12/08/2021   ALBUMIN 4.0 01/08/2021   CALCIUM 9.5 12/08/2021   GFRAA 123 10/11/2020   QFTBGOLDPLUS NEGATIVE 09/02/2021    Speciality Comments: No specialty comments available.  Procedures:  No procedures performed Allergies: Methotrexate derivatives, Peanut-containing drug products, Acyclovir and related, Aspirin, Eggs or egg-derived products, and Tomato   Assessment / Plan:     Visit Diagnoses: Rheumatoid arthritis involving multiple sites, unspecified whether rheumatoid factor present (HCC) - Plan: Sedimentation rate, Upadacitinib ER 15 MG TB24  Symptoms  appear reasonably controlled today though she is once again off the Rinvoq due to access or delayed follow-up.  Checking sed rate for disease activity monitoring she was previously extremely elevated.  Continue Rinvoq 15 mg p.o. daily.  We will continue with the prednisone 5 mg daily as needed.  Osteoarthritis of acromioclavicular joint  Left shoulder hurting worse than right at this time pain localizing more laterally than I would suspect with AC joint.  High risk medication use - rinvoq 15 mg daily and prednisone 5 mg daily PRN. - Plan: CBC with Differential/Platelet, COMPLETE METABOLIC PANEL WITH GFR  Checking CBC and CMP for medication monitoring on Rinvoq.  She has had a protracted symptoms attributed to bronchitis no pneumonia or severe symptoms.  Orders: Orders Placed This Encounter  Procedures   CBC with Differential/Platelet   COMPLETE METABOLIC PANEL WITH GFR   Sedimentation rate   Meds ordered this encounter  Medications   Upadacitinib ER 15 MG TB24    Sig: Take 15 mg by mouth daily.    Dispense:  30 tablet    Refill:  3     Follow-Up Instructions: Return in about 3 months (around 06/12/2022) for RA on rinvoq/GC f/u 3mos.   Fuller Plan, MD  Note - This record has been created using [employer].  Chart creation errors have been sought, but may not always  have been located. Such creation errors do not reflect on  the standard of medical care.

## 2022-03-12 ENCOUNTER — Encounter: Payer: Self-pay | Admitting: Internal Medicine

## 2022-03-12 ENCOUNTER — Ambulatory Visit (INDEPENDENT_AMBULATORY_CARE_PROVIDER_SITE_OTHER): Payer: Medicaid Other | Admitting: Internal Medicine

## 2022-03-12 VITALS — BP 124/80 | HR 106 | Resp 16 | Ht 63.0 in | Wt 216.0 lb

## 2022-03-12 DIAGNOSIS — Z79899 Other long term (current) drug therapy: Secondary | ICD-10-CM

## 2022-03-12 DIAGNOSIS — M19019 Primary osteoarthritis, unspecified shoulder: Secondary | ICD-10-CM | POA: Diagnosis not present

## 2022-03-12 DIAGNOSIS — M069 Rheumatoid arthritis, unspecified: Secondary | ICD-10-CM | POA: Diagnosis not present

## 2022-03-12 MED ORDER — UPADACITINIB ER 15 MG PO TB24: 15.0000 mg | ORAL_TABLET | Freq: Every day | ORAL | 3 refills | Status: DC

## 2022-03-13 ENCOUNTER — Encounter: Payer: Self-pay | Admitting: Student

## 2022-03-13 ENCOUNTER — Ambulatory Visit (INDEPENDENT_AMBULATORY_CARE_PROVIDER_SITE_OTHER): Payer: Medicaid Other | Admitting: Student

## 2022-03-13 VITALS — BP 116/75 | HR 102 | Ht 63.0 in | Wt 216.0 lb

## 2022-03-13 DIAGNOSIS — J4520 Mild intermittent asthma, uncomplicated: Secondary | ICD-10-CM

## 2022-03-13 DIAGNOSIS — R058 Other specified cough: Secondary | ICD-10-CM

## 2022-03-13 LAB — COMPLETE METABOLIC PANEL WITH GFR
AG Ratio: 1.1 (calc) (ref 1.0–2.5)
ALT: 14 U/L (ref 6–29)
AST: 19 U/L (ref 10–35)
Albumin: 4.2 g/dL (ref 3.6–5.1)
Alkaline phosphatase (APISO): 67 U/L (ref 37–153)
BUN: 12 mg/dL (ref 7–25)
CO2: 29 mmol/L (ref 20–32)
Calcium: 9.1 mg/dL (ref 8.6–10.4)
Chloride: 102 mmol/L (ref 98–110)
Creat: 0.66 mg/dL (ref 0.50–1.03)
Globulin: 3.8 g/dL (calc) — ABNORMAL HIGH (ref 1.9–3.7)
Glucose, Bld: 94 mg/dL (ref 65–99)
Potassium: 3.4 mmol/L — ABNORMAL LOW (ref 3.5–5.3)
Sodium: 140 mmol/L (ref 135–146)
Total Bilirubin: 0.7 mg/dL (ref 0.2–1.2)
Total Protein: 8 g/dL (ref 6.1–8.1)
eGFR: 104 mL/min/{1.73_m2} (ref >=60)

## 2022-03-13 LAB — CBC WITH DIFFERENTIAL/PLATELET
Absolute Monocytes: 271 cells/uL (ref 200–950)
Basophils Absolute: 9 cells/uL (ref 0–200)
Basophils Relative: 0.2 %
Eosinophils Absolute: 101 cells/uL (ref 15–500)
Eosinophils Relative: 2.2 %
HCT: 34.6 % — ABNORMAL LOW (ref 35.0–45.0)
Hemoglobin: 11.8 g/dL (ref 11.7–15.5)
Lymphs Abs: 1730 cells/uL (ref 850–3900)
MCH: 31.7 pg (ref 27.0–33.0)
MCHC: 34.1 g/dL (ref 32.0–36.0)
MCV: 93 fL (ref 80.0–100.0)
MPV: 9.4 fL (ref 7.5–12.5)
Monocytes Relative: 5.9 %
Neutro Abs: 2489 cells/uL (ref 1500–7800)
Neutrophils Relative %: 54.1 %
Platelets: 313 10*3/uL (ref 140–400)
RBC: 3.72 10*6/uL — ABNORMAL LOW (ref 3.80–5.10)
RDW: 13 % (ref 11.0–15.0)
Total Lymphocyte: 37.6 %
WBC: 4.6 10*3/uL (ref 3.8–10.8)

## 2022-03-13 LAB — SEDIMENTATION RATE: Sed Rate: >130 mm/h — ABNORMAL HIGH (ref 0–30)

## 2022-03-13 MED ORDER — CETIRIZINE HCL 10 MG PO TABS: 10.0000 mg | ORAL_TABLET | Freq: Every day | ORAL | 11 refills | Status: DC

## 2022-03-13 MED ORDER — ALBUTEROL SULFATE HFA 108 (90 BASE) MCG/ACT IN AERS: INHALATION_SPRAY | RESPIRATORY_TRACT | 2 refills | Status: DC

## 2022-03-13 NOTE — Progress Notes (Signed)
  SUBJECTIVE:   CHIEF COMPLAINT / HPI:   F/u cough URI on 02/12/22 office visit. Cough is worse at night, with flem, clear/white. No chest pain with cough. Feels like a tickle in her throat and has been hard to clear. No coughing fits, has tried cough syrup, mucinex, and tylenol. Appreciates some shortness of breath from coughing. Has used albuterol inhaler and it helps with cough. Using albuterol inhaler twice a week. No wheezing or chest tightness.  Asthma Reg: -Advair 230-21 2 puffs BID -Montelukast 10 mg daily  PERTINENT  PMH / PSH: Asthma  OBJECTIVE:  BP 116/75   Pulse (!) 102   Ht 5\' 3"  (1.6 m)   Wt 216 lb (98 kg)   SpO2 95%   BMI 38.26 kg/m   General: NAD, pleasant, able to participate in exam Cardiac: RRR, no murmurs auscultated. Respiratory: CTAB, normal effort, no wheezes, rales or rhonchi Abdomen: soft, non-tender, non-distended, normoactive bowel sounds Extremities: warm and well perfused, no edema or cyanosis. Skin: warm and dry, no rashes noted Neuro: alert, no obvious focal deficits, speech normal Psych: Normal affect and mood  ASSESSMENT/PLAN:   Post Viral Cough Patient likely suffering from Post Viral Cough syndrome, to be expected for 2-3 months post virus. Patient denies any SOB, or chest tightness, although symptoms worse at night, concerning for asthma, but less likely given lung exam and history. Patient is not a smoker, making COPD less likely. Patient also with no weight loss, making concern for pulmonary neoplasia less likely. Patient had negative CXR and no infectious symptoms making PNA less likely. Patient has Hx of GERD that could be playing a component, or could be suffering from some component of allergies, although she denies any itching, or nasal symptoms. Will have patient f/u in 4-6 weeks if cough not improving. -Continue to monitor -Honey for cough -Zyrtec for possible allergy symptoms -Return precautions given   Meds ordered this encounter   Medications   albuterol (PROAIR HFA) 108 (90 Base) MCG/ACT inhaler    Sig: PLEASE MAKE AN APPOINTMENT FOR YOUR ASTHMA    Dispense:  8.5 each    Refill:  2   cetirizine (ZYRTEC) 10 MG tablet    Sig: Take 1 tablet (10 mg total) by mouth daily.    Dispense:  30 tablet    Refill:  11   No follow-ups on file. @SIGNNOTE @

## 2022-03-13 NOTE — Patient Instructions (Addendum)
It was great to see you! Thank you for allowing me to participate in your care!  It sounds like this may be a post viral cough.   Our plans for today:  - Continue to monitor - Honey for cough - Return for f/u if not better in 4-6 weeks, or starts to get worse (more frequent, more productive, more shortness of breath) - Seek medical attention if  Develop shortness of breath, chest tightness, difficulty breathing, coughing up blood, large unintentional weight loss   Take care and seek immediate care sooner if you develop any concerns.   Dr. Bess Kinds, MD [facility]

## 2022-03-13 NOTE — Progress Notes (Signed)
Lab results look fine for continuing rinvoq daily. Her sedimentation rate test remains very high so would keep the prednisone 5 mg as needed.

## 2022-03-19 ENCOUNTER — Other Ambulatory Visit: Payer: Self-pay | Admitting: Internal Medicine

## 2022-03-19 ENCOUNTER — Other Ambulatory Visit: Payer: Self-pay | Admitting: Family Medicine

## 2022-03-19 ENCOUNTER — Other Ambulatory Visit: Payer: Self-pay | Admitting: Student

## 2022-04-01 ENCOUNTER — Other Ambulatory Visit: Payer: Self-pay | Admitting: Internal Medicine

## 2022-04-01 DIAGNOSIS — M069 Rheumatoid arthritis, unspecified: Secondary | ICD-10-CM

## 2022-04-01 NOTE — Telephone Encounter (Signed)
Next Visit: 06/12/2022  Last Visit: 03/12/2022  Last Fill: 02/22/2022  Current Dose per office note on 03/12/2022:   Okay to refill Tizanidine?

## 2022-04-19 ENCOUNTER — Other Ambulatory Visit: Payer: Self-pay | Admitting: Internal Medicine

## 2022-04-19 DIAGNOSIS — M069 Rheumatoid arthritis, unspecified: Secondary | ICD-10-CM

## 2022-04-25 ENCOUNTER — Other Ambulatory Visit: Payer: Self-pay | Admitting: Student

## 2022-04-25 ENCOUNTER — Other Ambulatory Visit: Payer: Self-pay | Admitting: Family Medicine

## 2022-05-07 ENCOUNTER — Other Ambulatory Visit: Payer: Self-pay | Admitting: Internal Medicine

## 2022-05-07 DIAGNOSIS — M069 Rheumatoid arthritis, unspecified: Secondary | ICD-10-CM

## 2022-05-11 ENCOUNTER — Other Ambulatory Visit: Payer: Self-pay | Admitting: Internal Medicine

## 2022-05-13 ENCOUNTER — Other Ambulatory Visit: Payer: Self-pay | Admitting: Internal Medicine

## 2022-05-13 ENCOUNTER — Other Ambulatory Visit: Payer: Self-pay

## 2022-05-13 DIAGNOSIS — M069 Rheumatoid arthritis, unspecified: Secondary | ICD-10-CM

## 2022-05-13 MED ORDER — FOLIC ACID 1 MG PO TABS: 1.0000 mg | ORAL_TABLET | Freq: Every day | ORAL | 1 refills | Status: DC

## 2022-05-13 NOTE — Telephone Encounter (Signed)
Next Visit: 06/12/2022   Last Visit: 03/12/2022   Last Fill: 04/01/2022   Current Dose per office note on 03/12/2022:    Okay to refill Tizanidine?

## 2022-06-01 NOTE — Progress Notes (Deleted)
Office Visit Note  Patient: Kathy [name]             Date of Birth: Oct 08, 1966           MRN: [id number]             PCP: Bess Kinds, MD Referring: Bess Kinds, MD Visit Date: 06/12/2022   Subjective:  No chief complaint on file.   History of Present Illness: Kathy [name] is a 56 y.o. female here for follow up or seropositive RA.   Previous HPI 03/12/2022 Kathy [name] is a 56 y.o. female here for follow up for seropositive RA on rinvoq 15 mg PO daily and prednisone 5 mg daily.  She has increased pain and stiffness in her left shoulder she notices this more with weather changes and sometimes takes prednisone up to twice daily for this. She is off rinvoq again about 2 weeks due to lack of refills.   Previous HPI 12/08/2021 Kathy [name] is a 56 y.o. female here for follow up for seropositive RA on rinvoq 15 mg PO daily and prednisone trying to taper off from 5 mg daily. She has felt improvement with resuming rinvoq symptoms are mostly good. She is off the medicine again since about 1 week ago from running out. She takes the 5 mg prednisone one in the morning on days where she has noticeably increased pain most often with cold and weather changes. She has shoulder pain at night also bothering her but not severely limiting any activities.    Previous HPI 09/02/21 Kathy [name] is a 56 y.o. female here for follow up for seropositive RA on Rinvoq 15 mg p.o. daily. Recommended trying to taper off the remaining low-dose prednisone if symptoms were well controlled since the last visit. She missed her last appointment due to bronchitis and so is off the rinvoq for at least 2 months. This did not cause a severe flare but she definitely feels a difference and increased prednisone use, She has been taking prednisone 5 mg daily whenever she notices more joint pain than usual often associated with weather changes. She takes 10 mg rarely sometimes in raining weather.     Previous HPI 05/09/21 AINSLEE [name] is a 56 y.o. female here for follow up for seropositive rheumatoid arthritis on Rinvoq 15 mg daily Plaquenil 200 mg daily and prednisone 5 mg daily.  After her last visit she restarted the Rinvoq due to lapse in treatment for a period of time.  She feels her symptoms are currently well controlled on this regimen and has no complaints.     No Rheumatology ROS completed.   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   Anemia 09/18/2014   Rheumatoid arthritis (HCC) 09/15/2009   DEGENERATIVE JOINT DISEASE, GENERALIZED 07/28/2007    Past Medical History:  Diagnosis Date   Asthma    seasonal   GERD (gastroesophageal reflux disease)    Rheumatoid arteritis (HCC)     Family History  Problem Relation Age of Onset   Asthma Mother  Diabetes Mother    Hypertension Mother    Heart disease Mother    Asthma Father    Cancer Father        lung   Lung cancer Sister    Cancer Sister        breast at 44   Cancer Brother        unknown   Past Surgical History:  Procedure Laterality Date   ABDOMINAL HYSTERECTOMY     TOTAL KNEE ARTHROPLASTY Right 06/18/2015   Procedure: RIGHT TOTAL KNEE ARTHROPLASTY;  Surgeon: Kathryne Hitch, MD;  Location: [facility] OR;  Service: Orthopedics;  Laterality: Right;   TOTAL KNEE ARTHROPLASTY Left 05/11/2017   Procedure: LEFT TOTAL KNEE ARTHROPLASTY;  Surgeon: Kathryne Hitch, MD;  Location: [facility] OR;  Service: Orthopedics;  Laterality: Left;    Social History   Social History Narrative   Not on file   Immunization History  Administered Date(s) Administered   Influenza,inj,Quad PF,6+ Mos 09/19/2014, 07/23/2015, 08/04/2018, 07/08/2020, 10/30/2021   Influenza,inj,Quad PF,6-35 Mos 11/16/2017   PFIZER Comirnaty(Gray Top)Covid-19 Tri-Sucrose Vaccine 11/22/2020   PFIZER(Purple Top)SARS-COV-2 Vaccination 02/29/2020, 03/26/2020   Pneumococcal Polysaccharide-23 10/26/2002   Tdap 07/03/2016, 08/04/2018     Objective: Vital Signs: There were no vitals taken for this visit.   Physical Exam   Musculoskeletal Exam: ***  CDAI Exam: CDAI Score: -- Patient Global: --; Provider Global: -- Swollen: --; Tender: -- Joint Exam 06/12/2022   No joint exam has been documented for this visit   There is currently no information documented on the homunculus. Go to the Rheumatology activity and complete the homunculus joint exam.  Investigation: No additional findings.  Imaging: No results found.  Recent Labs: Lab Results  Component Value Date   WBC 4.6 03/12/2022   HGB 11.8 03/12/2022   PLT 313 03/12/2022   NA 140 03/12/2022   K 3.4 (L) 03/12/2022   CL 102 03/12/2022   CO2 29 03/12/2022   GLUCOSE 94 03/12/2022   BUN 12 03/12/2022   CREATININE 0.66 03/12/2022   BILITOT 0.7 03/12/2022   ALKPHOS 75 01/08/2021   AST 19 03/12/2022   ALT 14 03/12/2022   PROT 8.0 03/12/2022   ALBUMIN 4.0 01/08/2021   CALCIUM 9.1 03/12/2022   GFRAA 123 10/11/2020   QFTBGOLDPLUS NEGATIVE 09/02/2021    Speciality Comments: No specialty comments available.  Procedures:  No procedures performed Allergies: Methotrexate derivatives, Peanut-containing drug products, Acyclovir and related, Aspirin, Eggs or egg-derived products, and Tomato   Assessment / Plan:     Visit Diagnoses: No diagnosis found.  ***  Orders: No orders of the defined types were placed in this encounter.  No orders of the defined types were placed in this  encounter.    Follow-Up Instructions: No follow-ups on file.   Jairo Ben, RT  Note - This record has been created using [employer].  Chart creation errors have been sought, but may not always  have been located. Such creation errors do not reflect on  the standard of medical care.

## 2022-06-12 ENCOUNTER — Ambulatory Visit: Payer: Medicaid Other | Admitting: Internal Medicine

## 2022-06-12 DIAGNOSIS — M069 Rheumatoid arthritis, unspecified: Secondary | ICD-10-CM

## 2022-06-12 DIAGNOSIS — M19019 Primary osteoarthritis, unspecified shoulder: Secondary | ICD-10-CM

## 2022-06-12 DIAGNOSIS — Z79899 Other long term (current) drug therapy: Secondary | ICD-10-CM

## 2022-06-25 ENCOUNTER — Other Ambulatory Visit: Payer: Self-pay | Admitting: Internal Medicine

## 2022-06-25 DIAGNOSIS — M069 Rheumatoid arthritis, unspecified: Secondary | ICD-10-CM

## 2022-06-26 ENCOUNTER — Other Ambulatory Visit: Payer: Self-pay | Admitting: Student

## 2022-06-26 DIAGNOSIS — F410 Panic disorder [episodic paroxysmal anxiety] without agoraphobia: Secondary | ICD-10-CM

## 2022-06-26 NOTE — Telephone Encounter (Signed)
Please schedule patient a follow up visit. Patient was due August 2023. Thanks!  

## 2022-06-26 NOTE — Telephone Encounter (Signed)
Next Visit: Due August 2023. Message sent to the front to schedule   Last Visit: 03/12/2022   Last Fill:04/20/2022   Current Dose per office note on 03/12/2022: prednisone 5 mg daily    Dx: Rheumatoid arthritis involving multiple sites, unspecified whether rheumatoid factor present    Okay to refill Prednisone?

## 2022-06-28 ENCOUNTER — Other Ambulatory Visit: Payer: Self-pay | Admitting: Internal Medicine

## 2022-06-28 ENCOUNTER — Other Ambulatory Visit: Payer: Self-pay | Admitting: Student

## 2022-06-30 ENCOUNTER — Other Ambulatory Visit: Payer: Self-pay

## 2022-06-30 NOTE — Telephone Encounter (Signed)
LMOM for patient to call and schedule follow-up appointment.   °

## 2022-06-30 NOTE — Telephone Encounter (Signed)
Please schedule patient a follow up visit. Patient was due August 2023. Thanks!  

## 2022-06-30 NOTE — Telephone Encounter (Signed)
Patient calls nurse line requesting medication refill on omeprazole. She reports that she is going out of town on Thursday and would like refill as soon as possible.   Forwarding request to PCP.   Veronda Prude, RN

## 2022-06-30 NOTE — Telephone Encounter (Signed)
Next Visit: Due August 2023. Message sent to the front to schedule   Last Visit: 03/12/2022   Last Fill: 02/12/2022   Current Dose per office note on 03/12/2022: not discussed    Dx: Rheumatoid arthritis involving multiple sites, unspecified whether rheumatoid factor present    Okay to refill Omeprazole?

## 2022-07-16 ENCOUNTER — Other Ambulatory Visit: Payer: Self-pay | Admitting: *Deleted

## 2022-07-16 ENCOUNTER — Telehealth: Payer: Self-pay

## 2022-07-16 DIAGNOSIS — M069 Rheumatoid arthritis, unspecified: Secondary | ICD-10-CM

## 2022-07-16 MED ORDER — TIZANIDINE HCL 4 MG PO TABS: ORAL_TABLET | ORAL | 0 refills | Status: DC

## 2022-07-16 NOTE — Telephone Encounter (Signed)
Next Visit: Due around 06/12/2022. Message sent to the front to schedule.   Last Visit: 03/12/2022  Last Fill: 05/15/2022  Dx: Rheumatoid arthritis involving multiple sites, unspecified whether rheumatoid factor present   Current Dose per office note on 03/12/2022: dosage not discussed  Okay to refill tizanidine?

## 2022-07-16 NOTE — Telephone Encounter (Signed)
Error

## 2022-08-20 ENCOUNTER — Other Ambulatory Visit: Payer: Self-pay | Admitting: Internal Medicine

## 2022-08-20 DIAGNOSIS — M069 Rheumatoid arthritis, unspecified: Secondary | ICD-10-CM

## 2022-08-20 NOTE — Telephone Encounter (Signed)
Next Visit: Due around 06/12/2022. Message sent to the front to schedule.  Last Visit: 03/12/2022  Last Fill: 07/16/2022  Dx: Rheumatoid arthritis, involving unspecified site, unspecified whether rheumatoid factor present   Current Dose per office note on 03/12/2022: dosage not discussed  Okay to refill tizanidine?

## 2022-08-20 NOTE — Telephone Encounter (Signed)
LMOM for patient to call and schedule follow-up appointment.   °

## 2022-09-07 ENCOUNTER — Other Ambulatory Visit: Payer: Self-pay | Admitting: Student

## 2022-09-07 ENCOUNTER — Other Ambulatory Visit: Payer: Self-pay | Admitting: Internal Medicine

## 2022-09-07 DIAGNOSIS — M069 Rheumatoid arthritis, unspecified: Secondary | ICD-10-CM

## 2022-09-07 DIAGNOSIS — E782 Mixed hyperlipidemia: Secondary | ICD-10-CM

## 2022-09-09 ENCOUNTER — Other Ambulatory Visit: Payer: Self-pay

## 2022-09-09 ENCOUNTER — Ambulatory Visit (INDEPENDENT_AMBULATORY_CARE_PROVIDER_SITE_OTHER): Payer: Medicaid Other | Admitting: Student

## 2022-09-09 ENCOUNTER — Encounter: Payer: Self-pay | Admitting: Student

## 2022-09-09 VITALS — BP 129/92 | HR 108 | Wt 197.4 lb

## 2022-09-09 DIAGNOSIS — F410 Panic disorder [episodic paroxysmal anxiety] without agoraphobia: Secondary | ICD-10-CM | POA: Diagnosis not present

## 2022-09-09 DIAGNOSIS — K219 Gastro-esophageal reflux disease without esophagitis: Secondary | ICD-10-CM

## 2022-09-09 DIAGNOSIS — E782 Mixed hyperlipidemia: Secondary | ICD-10-CM

## 2022-09-09 DIAGNOSIS — I10 Essential (primary) hypertension: Secondary | ICD-10-CM

## 2022-09-09 DIAGNOSIS — M069 Rheumatoid arthritis, unspecified: Secondary | ICD-10-CM | POA: Diagnosis not present

## 2022-09-09 DIAGNOSIS — J4540 Moderate persistent asthma, uncomplicated: Secondary | ICD-10-CM

## 2022-09-09 DIAGNOSIS — R059 Cough, unspecified: Secondary | ICD-10-CM | POA: Diagnosis not present

## 2022-09-09 MED ORDER — CETIRIZINE HCL 10 MG PO TABS
10.0000 mg | ORAL_TABLET | Freq: Every day | ORAL | 11 refills | Status: DC
Start: 2022-09-09 — End: 2023-08-11

## 2022-09-09 MED ORDER — BUSPIRONE HCL 10 MG PO TABS: 10.0000 mg | ORAL_TABLET | Freq: Two times a day (BID) | ORAL | 0 refills | Status: DC

## 2022-09-09 MED ORDER — FLUTICASONE-SALMETEROL 230-21 MCG/ACT IN AERO: 2.0000 | INHALATION_SPRAY | Freq: Two times a day (BID) | RESPIRATORY_TRACT | 12 refills | Status: DC

## 2022-09-09 MED ORDER — FLUTICASONE PROPIONATE 50 MCG/ACT NA SUSP: 2.0000 | Freq: Every day | NASAL | 6 refills | Status: DC

## 2022-09-09 MED ORDER — ATORVASTATIN CALCIUM 40 MG PO TABS: 40.0000 mg | ORAL_TABLET | Freq: Every day | ORAL | 3 refills | Status: DC

## 2022-09-09 MED ORDER — AMLODIPINE BESYLATE 10 MG PO TABS: 10.0000 mg | ORAL_TABLET | Freq: Every day | ORAL | 3 refills | Status: DC

## 2022-09-09 NOTE — Progress Notes (Signed)
SUBJECTIVE:   Chief compliant/HPI: annual examination  Kathy [name] is a 56 y.o. who presents today for an annual exam.   RA: Last seen by Rheum 12/08/21, patient on rinvoq 15 mg daily and prednisone 5 mg daily PRN. RA is flaring now, but she has an appointment with Rheum next week. Taking prednisone on days she has flare. Still taking the rinvoq.   GERD Has been getting bad. A lot of belching, not usually having buring sensation in throat, or sour taste in mouth.    Panic Attacks Feels different, feels stress and anxiety when it happens and last a few minutes. She tries to count to steady herself. Happening about twice a week. Unsure of any triggers.    Cough Happened when she came back from [place], has had cough for almost a week. Also reports runny nose, but denies any malaise, denies any fever, nausea, vomiting or diarrhea.  Reports has been out of allergy meds for a month.   Asthma: Seen in clinic 06/25/21 and started on Advair. Still taking medicine, reports no chest tightness, SOB or wheezing.  Asthma Reg: -Advair 230-21 2 puffs BID -Montelukast 10 mg daily    OBJECTIVE:   BP (!) 129/92   Pulse (!) 108   Wt 197 lb 6.4 oz (89.5 kg)   SpO2 99%   BMI 34.97 kg/m   Physical Exam Constitutional:      General: She is not in acute distress.    Appearance: Normal appearance. She is not ill-appearing.  HENT:     Mouth/Throat:     Mouth: Mucous membranes are moist.     Pharynx: Oropharynx is clear. No oropharyngeal exudate or posterior oropharyngeal erythema.  Cardiovascular:     Rate and Rhythm: Normal rate and regular rhythm.     Pulses: Normal pulses.     Heart sounds: Normal heart sounds. No murmur heard.    No friction rub. No gallop.  Pulmonary:     Effort: Pulmonary effort is normal. No respiratory distress.     Breath sounds: Normal breath sounds. No stridor. No wheezing, rhonchi or rales.  Abdominal:     General: Abdomen is flat.     Palpations:  Abdomen is soft.  Skin:    Capillary Refill: Capillary refill takes less than 2 seconds.  Neurological:     Mental Status: She is alert.      ASSESSMENT/PLAN:   Asthma Asthma doing well, reports no SOB, chest tightness, taking inhalers as prescribed, but needing refill on Advair. -Continue Advair -Albuterol PRN  Gastroesophageal reflux disease without esophagitis Reports she's been belching more, but denies any burning sensation, emesis, or foul taste in throat. Patient symptoms likely not coming from GERD.   Panic disorder Patient reports having more symptoms with episodes lasting a few minuts, multiple times a week. Patient attempts to use counting to relax, and unsure of triggers. Will start buspar to treat anxiety.  -Buspar 10 mg BID  Rheumatoid arthritis (HCC) Patient last seen 12/08/21 and is having flare now. Patient takes prednisone for flare and has appointment with them next week. Still taking Rinvoq. -Continue rinvoq 15 mg daily -Continue prednisone for flares -F/u with Rhem next week  Essential hypertension BP stable, refilling meds  Hyperlipidemia Lab Results  Component Value Date   CHOL 194 05/09/2021   HDL 61 05/09/2021   LDLCALC 116 (H) 05/09/2021   TRIG 71 05/09/2021   CHOLHDL 3.2 05/09/2021  -Refil meds -Last LDL slightly above goal, will  reassess at next visit   Cough Patient complains of cough for 1 week, with runny nose. Denies any malaise, fever, or consitutional/systemic symptoms. Reports she's been out of allergy meds for 1 month. Patient otherwise feeling well. Low concern for PNA given hx and accompanying symptoms. Given hx of being out of allergy meds, patient may be suffering from allergies. Less concerned for Covid, Flu given timeline of symptoms, patient outside of treatment window for either and unlikely given lack of consitutional symptoms.  -Refill allergy meds (zyrtec, flonase)     Bess Kinds, MD [facility]

## 2022-09-09 NOTE — Patient Instructions (Signed)
It was great to see you! Thank you for allowing me to participate in your care!  We saw you today to discuss your chronic conditions. We've reviewed your meds and I'm sending a new med in for your anxiety.   Our plans for today:  - Buspar for anxiety -  take 1 pill twice a day  - Med Refill - Cough - Likely coming from your allergies or from you getting over an Upper Respiratory Infection (something like the common cold). This should improve with restarting your allergy meds, or go away over time. Cough can last up to weeks following a Upper Respiratory Infection.  - Make follow up to be seen if cough persist for 2-3 weeks, or you start developing fever's / Shortness of breath / feeling ill  - Annual Exam Make appointment to be seen for an annual exam. I want to talk to you about testing for sexually transmitted disease, colon cancer screening, and screening for fragile bones (osteoporosis).   Take care and seek immediate care sooner if you develop any concerns.   Dr. Bess Kinds, MD [facility]

## 2022-09-11 NOTE — Assessment & Plan Note (Signed)
BP stable, refilling meds

## 2022-09-11 NOTE — Assessment & Plan Note (Signed)
Patient last seen 12/08/21 and is having flare now. Patient takes prednisone for flare and has appointment with them next week. Still taking Rinvoq. -Continue rinvoq 15 mg daily -Continue prednisone for flares -F/u with Rhem next week

## 2022-09-11 NOTE — Assessment & Plan Note (Signed)
Patient reports having more symptoms with episodes lasting a few minuts, multiple times a week. Patient attempts to use counting to relax, and unsure of triggers. Will start buspar to treat anxiety.  -Buspar 10 mg BID

## 2022-09-11 NOTE — Assessment & Plan Note (Signed)
Asthma doing well, reports no SOB, chest tightness, taking inhalers as prescribed, but needing refill on Advair. -Continue Advair -Albuterol PRN

## 2022-09-11 NOTE — Assessment & Plan Note (Signed)
Reports she's been belching more, but denies any burning sensation, emesis, or foul taste in throat. Patient symptoms likely not coming from GERD.

## 2022-09-11 NOTE — Assessment & Plan Note (Addendum)
Lab Results  Component Value Date   CHOL 194 05/09/2021   HDL 61 05/09/2021   LDLCALC 116 (H) 05/09/2021   TRIG 71 05/09/2021   CHOLHDL 3.2 05/09/2021  -Refil meds -Last LDL slightly above goal, will reassess at next visit

## 2022-09-11 NOTE — Assessment & Plan Note (Signed)
Patient complains of cough for 1 week, with runny nose. Denies any malaise, fever, or consitutional/systemic symptoms. Reports she's been out of allergy meds for 1 month. Patient otherwise feeling well. Low concern for PNA given hx and accompanying symptoms. Given hx of being out of allergy meds, patient may be suffering from allergies. Less concerned for Covid, Flu given timeline of symptoms, patient outside of treatment window for either and unlikely given lack of consitutional symptoms.  -Refill allergy meds (zyrtec, flonase)

## 2022-09-15 ENCOUNTER — Ambulatory Visit: Payer: Medicaid Other | Admitting: Internal Medicine

## 2022-09-21 ENCOUNTER — Other Ambulatory Visit: Payer: Self-pay | Admitting: Internal Medicine

## 2022-09-21 DIAGNOSIS — M069 Rheumatoid arthritis, unspecified: Secondary | ICD-10-CM

## 2022-09-21 MED ORDER — PREDNISONE 5 MG PO TABS: 5.0000 mg | ORAL_TABLET | Freq: Every day | ORAL | 0 refills | Status: DC | PRN

## 2022-09-21 MED ORDER — TIZANIDINE HCL 4 MG PO TABS: ORAL_TABLET | ORAL | 0 refills | Status: DC

## 2022-09-21 MED ORDER — OMEPRAZOLE 20 MG PO CPDR: 20.0000 mg | DELAYED_RELEASE_CAPSULE | Freq: Every day | ORAL | 0 refills | Status: DC

## 2022-09-21 NOTE — Telephone Encounter (Signed)
Next Visit: 10/14/2022  Last Visit: 03/12/2022  Last Fill: 06/30/2022 (Prilosec), 08/20/2022 (Tizanidine), 06/26/2022 (prednisone)  Dx: Rheumatoid arthritis involving multiple sites, unspecified whether rheumatoid factor present   Current Dose per office note on 03/12/2022: prednisone 5 mg daily as needed Prilosec and Tizanidine not discussed.   Okay to refill Prednisone, Prilosec and Tizanidine?

## 2022-09-21 NOTE — Telephone Encounter (Signed)
Patient called the office to schedule an appointment and requests a refill of all the medications Dr. Dimple Casey has her on. I asked the patient which medications exactly and she stated Dr. Dimple Casey would know. [facility] [address].

## 2022-09-25 ENCOUNTER — Other Ambulatory Visit: Payer: Self-pay | Admitting: Internal Medicine

## 2022-09-25 DIAGNOSIS — M069 Rheumatoid arthritis, unspecified: Secondary | ICD-10-CM

## 2022-10-08 ENCOUNTER — Other Ambulatory Visit: Payer: Self-pay | Admitting: Student

## 2022-10-09 ENCOUNTER — Telehealth: Payer: Self-pay | Admitting: Pharmacist

## 2022-10-09 NOTE — Telephone Encounter (Signed)
Received notification from [facility] regarding a prior authorization for [facility]. Authorization has been APPROVED from 10/09/22 to 10/10/23. Approval letter sent to scan center.  Per test claim, copay for 30 days supply is $3  Authorization # [id number]  Chesley Mires, PharmD, MPH, BCPS, CPP Clinical Pharmacist (Rheumatology and Pulmonology)

## 2022-10-13 NOTE — Progress Notes (Unsigned)
Office Visit Note  Patient: Kathy [name]             Date of Birth: 08/08/1966           MRN: [id number]             PCP: Bess Kinds, MD Referring: Bess Kinds, MD Visit Date: 10/14/2022   Subjective:  No chief complaint on file.   History of Present Illness: Kathy [name] is a 56 y.o. female here for follow up for seropositive RA on rinvoq 15 mg daily and prednisone 5 mg daily.***   Previous HPI 03/12/22 Kathy [name] is a 56 y.o. female here for follow up for seropositive RA on rinvoq 15 mg PO daily and prednisone 5 mg daily.  She has increased pain and stiffness in her left shoulder she notices this more with weather changes and sometimes takes prednisone up to twice daily for this. She is off rinvoq again about 2 weeks due to lack of refills.   Previous HPI 12/08/2021 Kathy [name] is a 57 y.o. female here for follow up for seropositive RA on rinvoq 15 mg PO daily and prednisone trying to taper off from 5 mg daily. She has felt improvement with resuming rinvoq symptoms are mostly good. She is off the medicine again since about 1 week ago from running out. She takes the 5 mg prednisone one in the morning on days where she has noticeably increased pain most often with cold and weather changes. She has shoulder pain at night also bothering her but not severely limiting any activities.    Previous HPI 09/02/21 Kathy [name] is a 56 y.o. female here for follow up for seropositive RA on Rinvoq 15 mg p.o. daily. Recommended trying to taper off the remaining low-dose prednisone if symptoms were well controlled since the last visit. She missed her last appointment due to bronchitis and so is off the rinvoq for at least 2 months. This did not cause a severe flare but she definitely feels a difference and increased prednisone use, She has been taking prednisone 5 mg daily whenever she notices more joint pain than usual often associated with weather changes. She takes 10 mg  rarely sometimes in raining weather.    Previous HPI 05/09/21 Kathy [name] is a 56 y.o. female here for follow up for seropositive rheumatoid arthritis on Rinvoq 15 mg daily Plaquenil 200 mg daily and prednisone 5 mg daily.  After her last visit she restarted the Rinvoq due to lapse in treatment for a period of time.  She feels her symptoms are currently well controlled on this regimen and has no complaints.   No Rheumatology ROS completed.   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   Anemia 09/18/2014   Cough 02/04/2010   Rheumatoid arthritis (HCC) 09/15/2009   DEGENERATIVE JOINT DISEASE, GENERALIZED 07/28/2007    Past Medical History:  Diagnosis Date   Asthma    seasonal   GERD (gastroesophageal reflux disease)    Rheumatoid arteritis (HCC)  Family History  Problem Relation Age of Onset   Asthma Mother    Diabetes Mother    Hypertension Mother    Heart disease Mother    Asthma Father    Cancer Father        lung   Lung cancer Sister    Cancer Sister        breast at 50   Cancer Brother        unknown   Past Surgical History:  Procedure Laterality Date   ABDOMINAL HYSTERECTOMY     TOTAL KNEE ARTHROPLASTY Right 06/18/2015   Procedure: RIGHT TOTAL KNEE ARTHROPLASTY;  Surgeon: Kathryne Hitch, MD;  Location: [facility] OR;  Service: Orthopedics;  Laterality: Right;   TOTAL KNEE ARTHROPLASTY Left 05/11/2017   Procedure: LEFT TOTAL KNEE ARTHROPLASTY;  Surgeon: Kathryne Hitch, MD;   Location: [facility] OR;  Service: Orthopedics;  Laterality: Left;   Social History   Social History Narrative   Not on file   Immunization History  Administered Date(s) Administered   Influenza,inj,Quad PF,6+ Mos 09/19/2014, 07/23/2015, 08/04/2018, 07/08/2020, 10/30/2021   Influenza,inj,Quad PF,6-35 Mos 11/16/2017   PFIZER Comirnaty(Gray Top)Covid-19 Tri-Sucrose Vaccine 11/22/2020   PFIZER(Purple Top)SARS-COV-2 Vaccination 02/29/2020, 03/26/2020   Pneumococcal Polysaccharide-23 10/26/2002   Tdap 07/03/2016, 08/04/2018     Objective: Vital Signs: There were no vitals taken for this visit.   Physical Exam   Musculoskeletal Exam: ***  CDAI Exam: CDAI Score: -- Patient Global: --; Provider Global: -- Swollen: --; Tender: -- Joint Exam 10/14/2022   No joint exam has been documented for this visit   There is currently no information documented on the homunculus. Go to the Rheumatology activity and complete the homunculus joint exam.  Investigation: No additional findings.  Imaging: No results found.  Recent Labs: Lab Results  Component Value Date   WBC 4.6 03/12/2022   HGB 11.8 03/12/2022   PLT 313 03/12/2022   NA 140 03/12/2022   K 3.4 (L) 03/12/2022   CL 102 03/12/2022   CO2 29 03/12/2022   GLUCOSE 94 03/12/2022   BUN 12 03/12/2022   CREATININE 0.66 03/12/2022   BILITOT 0.7 03/12/2022   ALKPHOS 75 01/08/2021   AST 19 03/12/2022   ALT 14 03/12/2022   PROT 8.0 03/12/2022   ALBUMIN 4.0 01/08/2021   CALCIUM 9.1 03/12/2022   GFRAA 123 10/11/2020   QFTBGOLDPLUS NEGATIVE 09/02/2021    Speciality Comments: No specialty comments available.  Procedures:  No procedures performed Allergies: Methotrexate derivatives, Peanut-containing drug products, Acyclovir and related, Aspirin, Eggs or egg-derived products, and Tomato   Assessment / Plan:     Visit Diagnoses: No diagnosis found.  ***  Orders: No orders of the defined types were placed in this encounter.  No  orders of the defined types were placed in this encounter.    Follow-Up Instructions: No follow-ups on file.   Fuller Plan, MD  Note - This record has been created using [employer].  Chart creation errors have been sought, but may not always  have been located. Such creation errors do not reflect on  the standard of medical care.

## 2022-10-14 ENCOUNTER — Encounter: Payer: Self-pay | Admitting: Internal Medicine

## 2022-10-14 ENCOUNTER — Ambulatory Visit: Payer: Medicaid Other

## 2022-10-14 ENCOUNTER — Ambulatory Visit (INDEPENDENT_AMBULATORY_CARE_PROVIDER_SITE_OTHER): Payer: Medicaid Other

## 2022-10-14 ENCOUNTER — Ambulatory Visit: Payer: Medicaid Other | Attending: Internal Medicine | Admitting: Internal Medicine

## 2022-10-14 VITALS — BP 115/72 | HR 94 | Resp 18 | Ht 64.0 in | Wt 195.0 lb

## 2022-10-14 DIAGNOSIS — M25512 Pain in left shoulder: Secondary | ICD-10-CM | POA: Diagnosis not present

## 2022-10-14 DIAGNOSIS — M159 Polyosteoarthritis, unspecified: Secondary | ICD-10-CM | POA: Diagnosis not present

## 2022-10-14 DIAGNOSIS — M069 Rheumatoid arthritis, unspecified: Secondary | ICD-10-CM

## 2022-10-14 DIAGNOSIS — M25511 Pain in right shoulder: Secondary | ICD-10-CM

## 2022-10-14 DIAGNOSIS — Z79899 Other long term (current) drug therapy: Secondary | ICD-10-CM

## 2022-10-14 DIAGNOSIS — M79641 Pain in right hand: Secondary | ICD-10-CM

## 2022-10-14 DIAGNOSIS — M79642 Pain in left hand: Secondary | ICD-10-CM

## 2022-10-14 MED ORDER — PREDNISONE 5 MG PO TABS: 5.0000 mg | ORAL_TABLET | Freq: Every day | ORAL | 2 refills | Status: DC | PRN

## 2022-10-14 MED ORDER — TIZANIDINE HCL 4 MG PO TABS: ORAL_TABLET | ORAL | 2 refills | Status: DC

## 2022-10-15 LAB — CBC WITH DIFFERENTIAL/PLATELET
Absolute Monocytes: 307 cells/uL (ref 200–950)
Basophils Absolute: 30 cells/uL (ref 0–200)
Basophils Relative: 0.5 %
Eosinophils Absolute: 41 cells/uL (ref 15–500)
Eosinophils Relative: 0.7 %
HCT: 34.3 % — ABNORMAL LOW (ref 35.0–45.0)
Hemoglobin: 11.6 g/dL — ABNORMAL LOW (ref 11.7–15.5)
Lymphs Abs: 2154 cells/uL (ref 850–3900)
MCH: 30.9 pg (ref 27.0–33.0)
MCHC: 33.8 g/dL (ref 32.0–36.0)
MCV: 91.2 fL (ref 80.0–100.0)
MPV: 9.9 fL (ref 7.5–12.5)
Monocytes Relative: 5.2 %
Neutro Abs: 3369 cells/uL (ref 1500–7800)
Neutrophils Relative %: 57.1 %
Platelets: 348 10*3/uL (ref 140–400)
RBC: 3.76 10*6/uL — ABNORMAL LOW (ref 3.80–5.10)
RDW: 13 % (ref 11.0–15.0)
Total Lymphocyte: 36.5 %
WBC: 5.9 10*3/uL (ref 3.8–10.8)

## 2022-10-15 LAB — COMPLETE METABOLIC PANEL WITH GFR
AG Ratio: 1.1 (calc) (ref 1.0–2.5)
ALT: 15 U/L (ref 6–29)
AST: 21 U/L (ref 10–35)
Albumin: 4.4 g/dL (ref 3.6–5.1)
Alkaline phosphatase (APISO): 78 U/L (ref 37–153)
BUN: 11 mg/dL (ref 7–25)
CO2: 27 mmol/L (ref 20–32)
Calcium: 9.5 mg/dL (ref 8.6–10.4)
Chloride: 104 mmol/L (ref 98–110)
Creat: 0.73 mg/dL (ref 0.50–1.03)
Globulin: 4.1 g/dL (calc) — ABNORMAL HIGH (ref 1.9–3.7)
Glucose, Bld: 91 mg/dL (ref 65–99)
Potassium: 4.7 mmol/L (ref 3.5–5.3)
Sodium: 139 mmol/L (ref 135–146)
Total Bilirubin: 0.6 mg/dL (ref 0.2–1.2)
Total Protein: 8.5 g/dL — ABNORMAL HIGH (ref 6.1–8.1)
eGFR: 96 mL/min/{1.73_m2} (ref >=60)

## 2022-10-15 LAB — SEDIMENTATION RATE: Sed Rate: >130 mm/h — ABNORMAL HIGH (ref 0–30)

## 2022-10-15 NOTE — Progress Notes (Signed)
Lab results look fine for continuing rinvoq and prednisone as planned. Her sedimenatoin rate remains very high like we discussed but not different from previous times. Xrays do not show any new erosion changes but there is a lot of existing damage. Her right shoulder looks suspicious for RA damage more than the left but both have some osteoarthritis.

## 2022-10-22 ENCOUNTER — Encounter: Payer: Self-pay | Admitting: *Deleted

## 2022-11-05 ENCOUNTER — Other Ambulatory Visit: Payer: Self-pay | Admitting: Internal Medicine

## 2022-11-05 DIAGNOSIS — M069 Rheumatoid arthritis, unspecified: Secondary | ICD-10-CM

## 2022-11-05 NOTE — Telephone Encounter (Signed)
Next Visit: 01/13/2023  Last Visit: 10/14/2022  Last Fill: 03/12/2022  DX: Rheumatoid arthritis involving multiple sites, unspecified whether rheumatoid factor present   Current Dose per office note 09/26/2022: Rinvoq 15 mg daily   Labs: 10/14/2022 Lab results look fine for continuing rinvoq and prednisone as planned.   TB Gold: 09/02/2021 Neg   Okay to refill Rinvoq?

## 2022-11-05 NOTE — Telephone Encounter (Signed)
Yes, rechecking Quantiferon at next appointment is fine.

## 2022-11-16 ENCOUNTER — Other Ambulatory Visit: Payer: Self-pay | Admitting: Student

## 2022-11-16 DIAGNOSIS — F410 Panic disorder [episodic paroxysmal anxiety] without agoraphobia: Secondary | ICD-10-CM

## 2022-12-24 ENCOUNTER — Other Ambulatory Visit: Payer: Self-pay | Admitting: Internal Medicine

## 2022-12-24 NOTE — Telephone Encounter (Signed)
Next Visit: 01/13/2023   Last Visit: 10/14/2022   Last Fill: 09/21/2022  Okay to refill Omeprazole?

## 2023-01-13 ENCOUNTER — Ambulatory Visit: Payer: Medicaid Other | Admitting: Internal Medicine

## 2023-01-22 ENCOUNTER — Other Ambulatory Visit: Payer: Self-pay | Admitting: Internal Medicine

## 2023-01-22 DIAGNOSIS — M069 Rheumatoid arthritis, unspecified: Secondary | ICD-10-CM

## 2023-01-25 ENCOUNTER — Other Ambulatory Visit: Payer: Self-pay | Admitting: Internal Medicine

## 2023-01-25 DIAGNOSIS — M069 Rheumatoid arthritis, unspecified: Secondary | ICD-10-CM

## 2023-01-25 NOTE — Telephone Encounter (Signed)
Last Fill: 10/14/2022  Next Visit: Due March 2024. Message sent to the front to schedule.   Last Visit: 10/14/2022  Dx: myalgia or myofascial pain component and for stiffness.   Current Dose per office note on 10/14/2022: Zanaflex 4 mg she has been taking once daily   Okay to refill Tizanidine?

## 2023-01-25 NOTE — Telephone Encounter (Signed)
Last Fill: 10/14/2022  Next Visit: Due 01/13/2023. Message sent to the front to schedule.   Last Visit: 10/14/2022  Dx: Rheumatoid arthritis involving multiple sites, unspecified whether rheumatoid factor present   Current Dose per office note on 10/14/2022: prednisone 5 mg daily   Okay to refill Prednisone?

## 2023-01-25 NOTE — Telephone Encounter (Signed)
Please schedule patient a follow up visit. Patient due 12/2022. Thanks!  

## 2023-02-26 ENCOUNTER — Other Ambulatory Visit: Payer: Self-pay | Admitting: Internal Medicine

## 2023-02-26 DIAGNOSIS — M069 Rheumatoid arthritis, unspecified: Secondary | ICD-10-CM

## 2023-03-01 NOTE — Telephone Encounter (Signed)
Last Fill: 01/25/2023  Next Visit: Due 01/13/2023. Message sent to the front to schedule.   Last Visit: 10/14/2022  Dx: Rheumatoid arthritis involving multiple sites, unspecified whether rheumatoid factor present   Current Dose per office note on 10/14/2022: prednisone 5 mg daily.   Okay to refill Prednisone?

## 2023-03-01 NOTE — Telephone Encounter (Signed)
LMOM for patient to call and schedule follow-up appointment.   °

## 2023-03-01 NOTE — Telephone Encounter (Signed)
Please schedule patient a follow up visit. Patient due 12/2022. Thanks!  

## 2023-03-05 ENCOUNTER — Other Ambulatory Visit: Payer: Self-pay | Admitting: Student

## 2023-03-05 ENCOUNTER — Other Ambulatory Visit: Payer: Self-pay | Admitting: Internal Medicine

## 2023-03-26 ENCOUNTER — Other Ambulatory Visit: Payer: Self-pay | Admitting: Internal Medicine

## 2023-03-26 DIAGNOSIS — M069 Rheumatoid arthritis, unspecified: Secondary | ICD-10-CM

## 2023-03-27 ENCOUNTER — Other Ambulatory Visit: Payer: Self-pay | Admitting: Internal Medicine

## 2023-04-02 ENCOUNTER — Other Ambulatory Visit: Payer: Self-pay | Admitting: Student

## 2023-04-09 ENCOUNTER — Other Ambulatory Visit: Payer: Self-pay | Admitting: Internal Medicine

## 2023-04-09 DIAGNOSIS — M069 Rheumatoid arthritis, unspecified: Secondary | ICD-10-CM

## 2023-04-12 ENCOUNTER — Ambulatory Visit: Payer: No Typology Code available for payment source | Admitting: Student

## 2023-04-19 ENCOUNTER — Telehealth: Payer: Self-pay

## 2023-04-19 NOTE — Telephone Encounter (Signed)
Patient will need appointment prior to refill. There has been a cancellation for 04/21/2023. Patient has been rescheduled for a sooner appointment on 04/21/2023 at 1:00 pm. Patient advised we will refill medication at her appointment.

## 2023-04-19 NOTE — Telephone Encounter (Signed)
Patient requesting medication refill for Prednisone and [facility] [address], best contact is [phone]

## 2023-04-20 ENCOUNTER — Ambulatory Visit: Payer: No Typology Code available for payment source | Admitting: Student

## 2023-04-20 NOTE — Progress Notes (Deleted)
    SUBJECTIVE:   Chief compliant/HPI: annual examination  Kathy [name] is a 57 y.o. who presents today for an annual exam.    History tabs reviewed and updated ***.   Review of systems form reviewed and notable for ***.   OBJECTIVE:   There were no vitals taken for this visit.  ***  ASSESSMENT/PLAN:   No problem-specific Assessment & Plan notes found for this encounter.    Annual Examination  See AVS for age appropriate recommendations  PHQ score ***, reviewed and discussed.  BP reviewed and at goal ***.  Asked about intimate partner violence and resources given as appropriate  Advance directives discussion ***  Considered the following items based upon USPSTF recommendations: Diabetes screening: discussed and ordered Screening for elevated cholesterol: discussed and ordered HIV testing: {discussed/ordered:14545} Neg 08/13/17 Hepatitis C: {discussed/ordered:14545} Neg 01/08/21 Hepatitis B: {discussed/ordered:14545} Neg 01/08/21 Syphilis if at high risk: {discussed/ordered:14545} GC/CT {GC/CT screening :23818} Osteoporosis screening considered based upon risk of fracture from [facility] calculator. Major osteoporotic fracture risk is 10%. DEXA not ordered.  Reviewed risk factors for latent tuberculosis and {not indicated/requested/declined:14582}   Discussed family history, BRCA testing {not indicated/requested/declined:14582}. Tool used to risk stratify was ***.  Cervical cancer screening:  Patient had hysterectomy Breast cancer screening: discussed potential benefits, risks including overdiagnosis and biopsy, elected proceed with mammogram Colorectal cancer screening: discussed, colonoscopy ordered Lung cancer screening: {discussed/declined/written info:19698}. See documentation below regarding indications/risks/benefits.  Vaccinations ***.   Follow up in 1 *** year or sooner if indicated.    Bess Kinds, MD [facility]

## 2023-04-21 ENCOUNTER — Ambulatory Visit: Payer: Medicaid Other | Attending: Internal Medicine | Admitting: Internal Medicine

## 2023-04-21 ENCOUNTER — Encounter: Payer: Self-pay | Admitting: Internal Medicine

## 2023-04-21 VITALS — BP 108/70 | HR 105 | Resp 16 | Ht 64.0 in | Wt 212.0 lb

## 2023-04-21 DIAGNOSIS — Z79899 Other long term (current) drug therapy: Secondary | ICD-10-CM | POA: Diagnosis not present

## 2023-04-21 DIAGNOSIS — K219 Gastro-esophageal reflux disease without esophagitis: Secondary | ICD-10-CM | POA: Diagnosis not present

## 2023-04-21 DIAGNOSIS — H539 Unspecified visual disturbance: Secondary | ICD-10-CM

## 2023-04-21 DIAGNOSIS — M069 Rheumatoid arthritis, unspecified: Secondary | ICD-10-CM

## 2023-04-21 LAB — CBC WITH DIFFERENTIAL/PLATELET
Absolute Monocytes: 320 cells/uL (ref 200–950)
Basophils Relative: 0.3 %
Hemoglobin: 10.9 g/dL — ABNORMAL LOW (ref 11.7–15.5)
Lymphs Abs: 2464 cells/uL (ref 850–3900)
Monocytes Relative: 4.5 %
Neutro Abs: 4239 cells/uL (ref 1500–7800)
Platelets: 453 10*3/uL — ABNORMAL HIGH (ref 140–400)

## 2023-04-21 MED ORDER — PREDNISONE 5 MG PO TABS: 5.0000 mg | ORAL_TABLET | Freq: Every day | ORAL | 0 refills | Status: DC

## 2023-04-21 MED ORDER — OMEPRAZOLE 20 MG PO CPDR: 20.0000 mg | DELAYED_RELEASE_CAPSULE | Freq: Every day | ORAL | 0 refills | Status: DC

## 2023-04-21 MED ORDER — PREDNISONE 5 MG PO TABS
ORAL_TABLET | ORAL | 0 refills | Status: AC
Start: 2023-04-21 — End: 2023-05-02

## 2023-04-21 NOTE — Progress Notes (Signed)
Office Visit Note  Patient: Kathy [name]             Date of Birth: 1966-01-29           MRN: [id number]             PCP: Bess Kinds, MD Referring: Bess Kinds, MD Visit Date: 04/21/2023   Subjective:  Follow-up (Patient states there is something wrong with her left eye. Patient states she wants refills of omeprazole and prednisone. )   History of Present Illness: Kathy [name] is a 57 y.o. female here for follow up for seropositive RA was on Rinvoq 15 mg daily and prednisone 5 mg daily but ran out of her medications due to delay in her clinic follow-up.  Originally was scheduled to be seen in March.  So she has been off her medicine in the past month.  Symptoms were doing well while on this regimen.  But now having a flare up with increased joint pain and swelling for the past week and a half. Worst in her shoulders and hands also swelling in right ankle. She also reports new complaint about left eye today with some pressure and new gray and purple spots in her vision.  Previous HPI 10/14/22 Kathy [name] is a 57 y.o. female here for follow up for seropositive RA on rinvoq 15 mg daily and prednisone 5 mg daily. She feels symptoms are pretty stable does still have considerable pain often in her feet and and shoulders most frequently. [place] was in [place] 3 [place] over the interval so off her rinvoq for a while resumed this before thanksgiving. She also feels some increased aches with the colder weather change. Taking the tizanidine 4 mg with her other medications once daily as well.   Previous HPI 03/12/22 Kathy [name] is a 57 y.o. female here for follow up for seropositive RA on rinvoq 15 mg PO daily and prednisone 5 mg daily.  She has increased pain and stiffness in her left shoulder she notices this more with weather changes and sometimes takes prednisone up to twice daily for this. She is off rinvoq again about 2 weeks due to lack of refills.   Previous  HPI 12/08/2021 Kathy [name] is a 57 y.o. female here for follow up for seropositive RA on rinvoq 15 mg PO daily and prednisone trying to taper off from 5 mg daily. She has felt improvement with resuming rinvoq symptoms are mostly good. She is off the medicine again since about 1 week ago from running out. She takes the 5 mg prednisone one in the morning on days where she has noticeably increased pain most often with cold and weather changes. She has shoulder pain at night also bothering her but not severely limiting any activities.    Previous HPI 09/02/21 Kathy [name] is a 57 y.o. female here for follow up for seropositive RA on Rinvoq 15 mg p.o. daily. Recommended trying to taper off the remaining low-dose prednisone if symptoms were well controlled since the last visit. She missed her last appointment due to bronchitis and so is off the rinvoq for at least 2 months. This did not cause a severe flare but she definitely feels a difference and increased prednisone use, She has been taking prednisone 5 mg daily whenever she notices more joint pain than usual often associated with weather changes. She takes 10 mg rarely sometimes in raining weather.    Previous HPI 05/09/21 Kathy [name]  is a 57 y.o. female here for follow up for seropositive rheumatoid arthritis on Rinvoq 15 mg daily Plaquenil 200 mg daily and prednisone 5 mg daily.  After her last visit she restarted the Rinvoq due to lapse in treatment for a period of time.  She feels her symptoms are currently well controlled on this regimen and has no complaints.  Previous HPI 10/11/20 Kathy [name] is a 57 y.o. female here to establish care of rheumatoid arthritis for which she previous saw Dr. Einar Grad in [facility] but is looking for an office closer to her residence due to difficulty with transportation. She has a history of highly active RA for years with pain that is worst in her shoulders, hands, knees, and feet with deforming  arthritis of hands and feet and has had bilateral knee replacements for arthritis. Previous rheumatologists Dr. Coral Spikes and Dr. Nickola Major. Her most recent visit with [place] office was in October with disease in exacerbation at that time and she took a prednisone taper but has remained active taking 15mg  of prednisone currently 10mg  AM and 5mg  PM.   Review of Systems  Constitutional:  Negative for fatigue.  HENT:  Negative for mouth sores and mouth dryness.   Eyes:  Negative for dryness.  Respiratory:  Negative for shortness of breath.   Cardiovascular:  Negative for chest pain and palpitations.  Gastrointestinal:  Negative for blood in stool, constipation and diarrhea.  Endocrine: Negative for increased urination.  Genitourinary:  Negative for involuntary urination.  Musculoskeletal:  Positive for joint pain, joint pain, joint swelling, myalgias, muscle weakness, morning stiffness, muscle tenderness and myalgias. Negative for gait problem.  Skin:  Negative for color change, rash, hair loss and sensitivity to sunlight.  Allergic/Immunologic: Negative for susceptible to infections.  Neurological:  Negative for dizziness and headaches.  Hematological:  Negative for swollen glands.  Psychiatric/Behavioral:  Negative for depressed mood and sleep disturbance. The patient is not nervous/anxious.     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   Anemia 09/18/2014   Cough 02/04/2010    Rheumatoid arthritis (HCC) 09/15/2009   Generalized osteoarthritis of multiple sites 07/28/2007    Past Medical History:  Diagnosis Date   Asthma    seasonal   GERD (gastroesophageal reflux disease)    Rheumatoid arteritis (HCC)     Family History  Problem Relation Age of Onset   Asthma Mother    Diabetes Mother    Hypertension Mother    Heart disease Mother    Asthma Father    Cancer Father        lung   Lung cancer Sister    Cancer Sister        breast at 67   Lupus Sister    Cancer Brother        unknown   Past Surgical History:  Procedure Laterality Date   ABDOMINAL HYSTERECTOMY     TOTAL KNEE ARTHROPLASTY Right 06/18/2015   Procedure: RIGHT TOTAL KNEE ARTHROPLASTY;  Surgeon: Kathryne Hitch, MD;  Location: [facility] OR;  Service: Orthopedics;  Laterality: Right;   TOTAL KNEE ARTHROPLASTY Left 05/11/2017   Procedure: LEFT TOTAL KNEE ARTHROPLASTY;  Surgeon: Kathryne Hitch, MD;  Location: [facility] OR;  Service: Orthopedics;  Laterality: Left;   Social History   Social History Narrative   Not on file   Immunization History  Administered Date(s) Administered   Influenza,inj,Quad PF,6+ Mos 09/19/2014, 07/23/2015, 08/04/2018, 07/08/2020, 10/30/2021   Influenza,inj,Quad PF,6-35 Mos 11/16/2017   PFIZER Comirnaty(Gray Top)Covid-19 Tri-Sucrose Vaccine 11/22/2020   PFIZER(Purple Top)SARS-COV-2 Vaccination 02/29/2020, 03/26/2020   Pneumococcal Polysaccharide-23 10/26/2002   Tdap 07/03/2016, 08/04/2018     Objective: Vital Signs: BP 108/70 (BP Location: Left Arm, Patient Position: Sitting, Cuff Size: Normal)   Pulse (!) 105   Resp 16   Ht 5\' 4"  (1.626 m)   Wt 212 lb (96.2 kg)   BMI 36.39 kg/m    Physical Exam Constitutional:      Appearance: She is obese.  HENT:     Mouth/Throat:     Mouth: Mucous membranes are moist.     Pharynx: Oropharynx is clear.  Eyes:     Extraocular Movements: Extraocular movements intact.     Conjunctiva/sclera: Conjunctivae normal.      Pupils: Pupils are equal, round, and reactive to light.  Cardiovascular:     Rate and Rhythm: Normal rate and regular rhythm.  Pulmonary:     Effort: Pulmonary effort is normal.     Breath sounds: Normal breath sounds.  Musculoskeletal:     Right lower leg: No edema.     Left lower leg: No edema.  Lymphadenopathy:     Cervical: No cervical adenopathy.  Skin:    General: Skin is warm and dry.     Findings: No rash.  Neurological:     Mental Status: She is alert.  Psychiatric:        Mood and Affect: Mood normal.      Musculoskeletal Exam:  Bilateral shoulder pain with pressure, pain with overhead abduction Right elbow tenderness, severely limited ROM in about 60-100 degrees, left elbow limited ROM with hard endpoints no tenderness or swelling Bilateral wrist swelling, mildly tender, severely reduced ROM Right 2nd-3rd MCP 3rd PIP joint swelling, tenderness, decreased flexion, left hand with nonreducible swan neck deformities Knees full ROM b/l no effusions Right ankle swelling and tenderness, left ankle normal   CDAI Exam: CDAI Score: 28  Patient Global: 40 / 100; Provider Global: 50 / 100 Swollen: 9 ; Tender: 12  Joint Exam 04/21/2023      Right  Left  Glenohumeral   Tender   Tender  Elbow   Tender     Wrist  Swollen Tender  Swollen Tender  MCP 1  Swollen Tender     MCP 2  Swollen Tender  Swollen Tender  MCP 3  Swollen Tender  Swollen Tender  PIP 3  Swollen Tender     Ankle  Swollen Tender        Investigation: No additional findings.  Imaging: No results found.  Recent Labs: Lab Results  Component Value Date   WBC 5.9 10/14/2022   HGB 11.6 (L) 10/14/2022   PLT 348 10/14/2022   NA 139 10/14/2022   K 4.7 10/14/2022   CL 104 10/14/2022   CO2 27 10/14/2022   GLUCOSE 91 10/14/2022   BUN 11 10/14/2022   CREATININE 0.73 10/14/2022   BILITOT 0.6 10/14/2022   ALKPHOS 75 01/08/2021   AST 21 10/14/2022   ALT 15 10/14/2022  PROT 8.5 (H) 10/14/2022    ALBUMIN 4.0 01/08/2021   CALCIUM 9.5 10/14/2022   GFRAA 123 10/11/2020   QFTBGOLDPLUS NEGATIVE 09/02/2021    Speciality Comments: No specialty comments available.  Procedures:  No procedures performed Allergies: Methotrexate derivatives, Peanut-containing drug products, Acyclovir and related, Aspirin, Egg-derived products, and Tomato   Assessment / Plan:     Visit Diagnoses: Rheumatoid arthritis involving multiple sites, unspecified whether rheumatoid factor present (HCC) - Plan: predniSONE (DELTASONE) 5 MG tablet, predniSONE (DELTASONE) 5 MG tablet, Sedimentation rate  Longstanding seropositive erosive rheumatoid arthritis currently in exacerbation due to lapse in her medications.  These ran out due to lack of clinical follow-up 3 months past original plan appointment in March.  Rechecking sed rate today for disease activity monitoring.  Plan to restart prednisone on a taper initially 20 mg and then go back down to 5 mg maintenance daily.  Rechecking baseline labs with plan to restart on Rinvoq 15 mg daily.  Gastroesophageal reflux disease without esophagitis - Plan: omeprazole (PRILOSEC) 20 MG capsule  Chronic acid reflux symptoms and on long-term prednisone has been doing well on omeprazole 20 mg new prescription today.  High risk medication use - Plan: CBC with Differential/Platelet, COMPLETE METABOLIC PANEL WITH GFR, QuantiFERON-TB Gold Plus  Checking CBC CMP and QuantiFERON screening for medication monitoring resuming Rinvoq.  Previously tolerating medications well without major side effects no serious interval infections.  Change in vision  New complaint started today left eye pressure and describes visual change as gray and purple-colored spots in her vision.  No conjunctival injection no localized swelling no obvious visual field deficit. She has not seen her eye doctor since about 2019. Not sure if this is directly related but recommend urgent evaluation to rule out retinal or  vascular inflammation. Will try to reach out and get her an appoint to evaluate this today if possible. Else ASAP or if worse would need to see urgent care or ED.  Orders: Orders Placed This Encounter  Procedures   Sedimentation rate   CBC with Differential/Platelet   COMPLETE METABOLIC PANEL WITH GFR   QuantiFERON-TB Gold Plus   Meds ordered this encounter  Medications   omeprazole (PRILOSEC) 20 MG capsule    Sig: Take 1 capsule (20 mg total) by mouth daily.    Dispense:  90 capsule    Refill:  0   predniSONE (DELTASONE) 5 MG tablet    Sig: Take 1 tablet (5 mg total) by mouth daily with breakfast.    Dispense:  90 tablet    Refill:  0   predniSONE (DELTASONE) 5 MG tablet    Sig: Take 4 tablets (20 mg total) by mouth daily with breakfast for 3 days, THEN 3 tablets (15 mg total) daily with breakfast for 3 days, THEN 2 tablets (10 mg total) daily with breakfast for 3 days, THEN 1 tablet (5 mg total) daily with breakfast for 3 days.    Dispense:  30 tablet    Refill:  0     Follow-Up Instructions: No follow-ups on file.   Fuller Plan, MD  Note - This record has been created using [employer].  Chart creation errors have been sought, but may not always  have been located. Such creation errors do not reflect on  the standard of medical care.

## 2023-04-22 LAB — COMPLETE METABOLIC PANEL WITH GFR
Alkaline phosphatase (APISO): 69 U/L (ref 37–153)
BUN: 9 mg/dL (ref 7–25)
CO2: 30 mmol/L (ref 20–32)
Chloride: 98 mmol/L (ref 98–110)
Potassium: 3.3 mmol/L — ABNORMAL LOW (ref 3.5–5.3)
Total Bilirubin: 0.6 mg/dL (ref 0.2–1.2)

## 2023-04-22 LAB — CBC WITH DIFFERENTIAL/PLATELET
Basophils Absolute: 21 cells/uL (ref 0–200)
Eosinophils Relative: 0.8 %
MCH: 30.8 pg (ref 27.0–33.0)
MCHC: 34.3 g/dL (ref 32.0–36.0)
MCV: 89.8 fL (ref 80.0–100.0)
RDW: 11.8 % (ref 11.0–15.0)

## 2023-04-23 LAB — COMPLETE METABOLIC PANEL WITH GFR
AG Ratio: 1.1 (calc) (ref 1.0–2.5)
ALT: 26 U/L (ref 6–29)
AST: 51 U/L — ABNORMAL HIGH (ref 10–35)
Albumin: 4.1 g/dL (ref 3.6–5.1)
Calcium: 9.5 mg/dL (ref 8.6–10.4)
Creat: 0.63 mg/dL (ref 0.50–1.03)
Globulin: 3.6 g/dL (calc) (ref 1.9–3.7)
Glucose, Bld: 128 mg/dL — ABNORMAL HIGH (ref 65–99)
Sodium: 140 mmol/L (ref 135–146)
Total Protein: 7.7 g/dL (ref 6.1–8.1)
eGFR: 104 mL/min/{1.73_m2} (ref >=60)

## 2023-04-23 LAB — CBC WITH DIFFERENTIAL/PLATELET
Eosinophils Absolute: 57 cells/uL (ref 15–500)
HCT: 31.8 % — ABNORMAL LOW (ref 35.0–45.0)
MPV: 10.1 fL (ref 7.5–12.5)
Neutrophils Relative %: 59.7 %
RBC: 3.54 10*6/uL — ABNORMAL LOW (ref 3.80–5.10)
Total Lymphocyte: 34.7 %
WBC: 7.1 10*3/uL (ref 3.8–10.8)

## 2023-04-23 LAB — QUANTIFERON-TB GOLD PLUS
Mitogen-NIL: >10 IU/mL
NIL: 0.03 IU/mL
QuantiFERON-TB Gold Plus: NEGATIVE
TB1-NIL: 0 IU/mL
TB2-NIL: 0 IU/mL

## 2023-04-23 LAB — SEDIMENTATION RATE: Sed Rate: >130 mm/h — ABNORMAL HIGH (ref 0–30)

## 2023-05-04 ENCOUNTER — Telehealth: Payer: Self-pay

## 2023-05-04 NOTE — Telephone Encounter (Signed)
Chart review completed for patient. Patient is due for screening mammogram.  No answer and no voice mail set up. Elijio Miles, Population Health Specialist.

## 2023-05-07 ENCOUNTER — Other Ambulatory Visit: Payer: Self-pay | Admitting: Internal Medicine

## 2023-05-07 ENCOUNTER — Telehealth: Payer: Self-pay | Admitting: *Deleted

## 2023-05-07 DIAGNOSIS — K219 Gastro-esophageal reflux disease without esophagitis: Secondary | ICD-10-CM

## 2023-05-07 MED ORDER — RINVOQ 15 MG PO TB24
15.0000 mg | ORAL_TABLET | Freq: Every day | ORAL | 3 refills | Status: DC
Start: 2023-05-07 — End: 2023-05-07

## 2023-05-07 MED ORDER — RINVOQ 15 MG PO TB24
15.0000 mg | ORAL_TABLET | Freq: Every day | ORAL | 3 refills | Status: DC
Start: 2023-05-07 — End: 2023-07-30

## 2023-05-07 NOTE — Progress Notes (Signed)
Sedimentation rate remains very high over 130.   Blood count shows platelets increased to 453 this is probably from inflammation as well. White blood cells are normal.  AST is slightly increased at 51 this is a liver enzyme marker, it has been normal previously.  I recommend she can resume the rinvoq and sent a new Rx for this to the specialty pharmacy. She can also continue the prednisone as planned.

## 2023-05-07 NOTE — Telephone Encounter (Signed)
Patient called requesting RX for PLQ. I don't see anything in her last office regarding RX for PLQ from you? [facility]

## 2023-05-07 NOTE — Addendum Note (Signed)
Addended by: Fuller Plan on: 05/07/2023 03:57 PM   Modules accepted: Orders

## 2023-05-10 NOTE — Telephone Encounter (Signed)
Patient advised We have not been prescribing this, last Rx Dr. Dimple Casey thinks was from 2022. Currently on Rinvoq and prednisone. We can discuss whether or not to restart it when we follow up. Patent verbalized understanding.

## 2023-05-10 NOTE — Telephone Encounter (Signed)
Attempted to contact the patient and could not leave a message because the mailbox is not set up.

## 2023-05-10 NOTE — Telephone Encounter (Signed)
We have not been prescribing this, last Rx I think was from 2022. Currently on Rinvoq and prednisone. We can discuss whether or not to restart it when we follow up.

## 2023-05-11 ENCOUNTER — Telehealth: Payer: Self-pay | Admitting: Pharmacist

## 2023-05-11 NOTE — Telephone Encounter (Signed)
Received fax from [facility] stating Rinvoq requires PA. Patient appears to have changed insurance and was off of treatment for approximately 2-3 months. Submitted a Prior Authorization request to [facility] for [facility] via CoverMyMeds. Will update once we receive a response.  Key: [id number]

## 2023-05-11 NOTE — Telephone Encounter (Signed)
Received notification from  [employer] [place]  regarding a prior authorization for [facility]. Authorization has been APPROVED from 05/11/23 to 11/07/23. Approval letter sent to scan center.  Patient must continue to fill through [facility]: [phone]  Authorization # [id number] Phone # [phone]  Chesley Mires, PharmD, MPH, BCPS, CPP Clinical Pharmacist (Rheumatology and Pulmonology)

## 2023-05-11 NOTE — Telephone Encounter (Signed)
Please advise 

## 2023-05-13 ENCOUNTER — Telehealth: Payer: Self-pay

## 2023-05-13 NOTE — Telephone Encounter (Signed)
Patient states her Medicaid may have been inactivated and she is unsure why. She has left VM with Social Services department today (hasn't heard back).  If patient has commercial plan, she'd be eligible for savings card. Initiated enrollment via [employer] portal. Pending Rinvoq copay card information to populate  Chesley Mires, PharmD, MPH, BCPS, CPP Clinical Pharmacist (Rheumatology and Pulmonology)

## 2023-05-13 NOTE — Telephone Encounter (Signed)
Kathy [name] from [facility] contacted the office and left a message stating the patient did not accept or receive her Rinvoq because of her insurance. Kathy [name]'s call back number is [phone]. Contacted the patient to inquire what was going on and why she did not get the Rinvoq. Patient states she was called by [facility] yesterday and they told her that should would have to pay $6,000. Patient states she is not sure why but she is going to try to reapply to medicaid. Patient's call back number is [phone].

## 2023-05-16 ENCOUNTER — Other Ambulatory Visit: Payer: Self-pay | Admitting: Internal Medicine

## 2023-05-16 DIAGNOSIS — M069 Rheumatoid arthritis, unspecified: Secondary | ICD-10-CM

## 2023-05-17 NOTE — Telephone Encounter (Signed)
Last Fill: 01/25/2023  Next Visit: 07/22/2023  Last Visit: 04/21/2023  Dx: Rheumatoid arthritis involving multiple sites   Current Dose per office note on 04/21/2023: not discussed  Okay to refill Tizanidine?

## 2023-05-17 NOTE — Telephone Encounter (Signed)
Copay card information for Rinvoq has not populated in portal. Called [employer]. They will have rep re-verify benefits investigation.  Chesley Mires, PharmD, MPH, BCPS, CPP Clinical Pharmacist (Rheumatology and Pulmonology)

## 2023-05-18 ENCOUNTER — Other Ambulatory Visit (HOSPITAL_COMMUNITY): Payer: Self-pay

## 2023-05-18 NOTE — Telephone Encounter (Addendum)
Fax with Rinvoq copay card received from [facility] [employer]: Group # [id number] BIN: [id number] PCN: [id number] Group: [id number] ID: [id number]  Patient apparently already reactivated Medicaid  Medicaid processed yesterday - scheduled refill with patient yesterday. Rinvoq is set up to deliver on 05/24/23 to patient's home. Nothing further should be needed.  Chesley Mires, PharmD, MPH, BCPS, CPP Clinical Pharmacist (Rheumatology and Pulmonology)

## 2023-05-19 ENCOUNTER — Ambulatory Visit: Payer: No Typology Code available for payment source | Admitting: Internal Medicine

## 2023-05-26 ENCOUNTER — Encounter: Payer: Self-pay | Admitting: Internal Medicine

## 2023-06-11 ENCOUNTER — Other Ambulatory Visit: Payer: Self-pay | Admitting: Internal Medicine

## 2023-06-11 DIAGNOSIS — K219 Gastro-esophageal reflux disease without esophagitis: Secondary | ICD-10-CM

## 2023-07-06 ENCOUNTER — Other Ambulatory Visit: Payer: Self-pay | Admitting: Internal Medicine

## 2023-07-06 ENCOUNTER — Other Ambulatory Visit: Payer: Self-pay | Admitting: Student

## 2023-07-06 DIAGNOSIS — K219 Gastro-esophageal reflux disease without esophagitis: Secondary | ICD-10-CM

## 2023-07-06 DIAGNOSIS — M069 Rheumatoid arthritis, unspecified: Secondary | ICD-10-CM

## 2023-07-06 DIAGNOSIS — F410 Panic disorder [episodic paroxysmal anxiety] without agoraphobia: Secondary | ICD-10-CM

## 2023-07-08 NOTE — Progress Notes (Signed)
Office Visit Note  Patient: Kathy [name]             Date of Birth: 12/16/1965           MRN: [id number]             PCP: Bess Kinds, MD Referring: Bess Kinds, MD Visit Date: 07/22/2023   Subjective:  Follow-up (Patient states she is currently out of prednisone. Patient states sometimes her left hand cramps and can get numb. )   History of Present Illness: Kathy [name] is a 57 y.o. female here for follow up for seropositive RA was on Rinvoq 15 mg daily and prednisone 5 mg daily but ran out of her medications due to delay in her clinic follow-up.  She is off the prednisone since about 4 days ago has noticed increased pain in her left hand but it is inconsistent worse in the mornings and coming and going during the day.  Before that feels symptoms are doing pretty well still had intermittent numbness and cramping again worse in the left hand.  Did not have very prolonged morning stiffness while she had both her medications.   Previous HPI 04/21/2023 Kathy [name] is a 57 y.o. female here for follow up for seropositive RA was on Rinvoq 15 mg daily and prednisone 5 mg daily but ran out of her medications due to delay in her clinic follow-up.  Originally was scheduled to be seen in March.  So she has been off her medicine in the past month.  Symptoms were doing well while on this regimen.  But now having a flare up with increased joint pain and swelling for the past week and a half. Worst in her shoulders and hands also swelling in right ankle. She also reports new complaint about left eye today with some pressure and new gray and purple spots in her vision.   Previous HPI 10/14/22 Kathy [name] is a 57 y.o. female here for follow up for seropositive RA on rinvoq 15 mg daily and prednisone 5 mg daily. She feels symptoms are pretty stable does still have considerable pain often in her feet and and shoulders most frequently. She was in [place] over the  interval so off her rinvoq for a while resumed this before thanksgiving. She also feels some increased aches with the colder weather change. Taking the tizanidine 4 mg with her other medications once daily as well.   Previous HPI 03/12/22 Kathy [name] is a 57 y.o. female here for follow up for seropositive RA on rinvoq 15 mg PO daily and prednisone 5 mg daily.  She has increased pain and stiffness in her left shoulder she notices this more with weather changes and sometimes takes prednisone up to twice daily for this. She is off rinvoq again about 2 weeks due to lack of refills.   Previous HPI 12/08/2021 Kathy [name] is a 57 y.o. female here for follow up for seropositive RA on rinvoq 15 mg PO daily and prednisone trying to taper off from 5 mg daily. She has felt improvement with resuming rinvoq symptoms are mostly good. She is off the medicine again since about 1 week ago from running out. She takes the 5 mg prednisone one in the morning on days where she has noticeably increased pain most often with cold and weather changes. She has shoulder pain at night also bothering her but not severely limiting any activities.    Previous HPI 09/02/21  Kathy [name] is a 57 y.o. female here for follow up for seropositive RA on Rinvoq 15 mg p.o. daily. Recommended trying to taper off the remaining low-dose prednisone if symptoms were well controlled since the last visit. She missed her last appointment due to bronchitis and so is off the rinvoq for at least 2 months. This did not cause a severe flare but she definitely feels a difference and increased prednisone use, She has been taking prednisone 5 mg daily whenever she notices more joint pain than usual often associated with weather changes. She takes 10 mg rarely sometimes in raining weather.    Previous HPI 05/09/21 Kathy [name] is a 57 y.o. female here for follow up for seropositive rheumatoid arthritis on Rinvoq 15 mg daily Plaquenil 200 mg  daily and prednisone 5 mg daily.  After her last visit she restarted the Rinvoq due to lapse in treatment for a period of time.  She feels her symptoms are currently well controlled on this regimen and has no complaints.   Previous HPI 10/11/20 Kathy [name] is a 57 y.o. female here to establish care of rheumatoid arthritis for which she previous saw Dr. Einar Grad in [facility] but is looking for an office closer to her residence due to difficulty with transportation. She has a history of highly active RA for years with pain that is worst in her shoulders, hands, knees, and feet with deforming arthritis of hands and feet and has had bilateral knee replacements for arthritis. Previous rheumatologists Dr. Coral Spikes and Dr. Nickola Major. Her most recent visit with [place] office was in October with disease in exacerbation at that time and she took a prednisone taper but has remained active taking 15mg  of prednisone currently 10mg  AM and 5mg  PM.    Review of Systems  Constitutional:  Positive for fatigue.  HENT:  Negative for mouth sores and mouth dryness.   Eyes:  Positive for dryness.  Respiratory:  Negative for shortness of breath.   Cardiovascular:  Negative for chest pain and palpitations.  Gastrointestinal:  Negative for blood in stool, constipation and diarrhea.  Endocrine: Negative for increased urination.  Genitourinary:  Negative for involuntary urination.  Musculoskeletal:  Positive for joint pain, joint pain, joint swelling, myalgias, morning stiffness, muscle tenderness and myalgias. Negative for gait problem and muscle weakness.  Skin:  Negative for color change, rash, hair loss and sensitivity to sunlight.  Allergic/Immunologic: Negative for susceptible to infections.  Neurological:  Negative for dizziness and headaches.  Hematological:  Negative for swollen glands.  Psychiatric/Behavioral:  Positive for sleep disturbance. Negative for depressed mood. The patient is not  nervous/anxious.     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   Anemia 09/18/2014   Cough 02/04/2010   Rheumatoid arthritis (HCC) 09/15/2009   Generalized osteoarthritis of multiple sites 07/28/2007    Past Medical History:  Diagnosis Date   Asthma    seasonal   GERD (gastroesophageal reflux disease)    Rheumatoid arteritis (HCC)  Family History  Problem Relation Age of Onset   Asthma Mother    Diabetes Mother    Hypertension Mother    Heart disease Mother    Asthma Father    Cancer Father        lung   Lung cancer Sister    Cancer Sister        breast at 71   Lupus Sister    Cancer Brother        unknown   Past Surgical History:  Procedure Laterality Date   ABDOMINAL HYSTERECTOMY     TOTAL KNEE ARTHROPLASTY Right 06/18/2015   Procedure: RIGHT TOTAL KNEE ARTHROPLASTY;  Surgeon: Kathryne Hitch, MD;  Location: [facility] OR;  Service: Orthopedics;  Laterality: Right;   TOTAL KNEE ARTHROPLASTY Left 05/11/2017   Procedure: LEFT TOTAL KNEE ARTHROPLASTY;  Surgeon: Kathryne Hitch, MD;  Location: [facility] OR;  Service: Orthopedics;  Laterality: Left;   Social History   Social History Narrative   Not on file   Immunization History  Administered Date(s) Administered   Influenza,inj,Quad PF,6+ Mos 09/19/2014, 07/23/2015, 08/04/2018, 07/08/2020, 10/30/2021   Influenza,inj,Quad PF,6-35 Mos 11/16/2017   PFIZER Comirnaty(Gray Top)Covid-19 Tri-Sucrose Vaccine  11/22/2020   PFIZER(Purple Top)SARS-COV-2 Vaccination 02/29/2020, 03/26/2020   Pneumococcal Polysaccharide-23 10/26/2002   Tdap 07/03/2016, 08/04/2018     Objective: Vital Signs: BP 103/68 (BP Location: Left Arm, Patient Position: Sitting, Cuff Size: Normal)   Pulse 91   Resp 14   Ht 5\' 4"  (1.626 m)   Wt 212 lb (96.2 kg)   BMI 36.39 kg/m    Physical Exam Constitutional:      Appearance: She is obese.  Eyes:     Conjunctiva/sclera: Conjunctivae normal.  Cardiovascular:     Rate and Rhythm: Normal rate and regular rhythm.  Pulmonary:     Effort: Pulmonary effort is normal.     Breath sounds: Normal breath sounds.  Musculoskeletal:     Right lower leg: No edema.     Left lower leg: No edema.  Lymphadenopathy:     Cervical: No cervical adenopathy.  Skin:    General: Skin is warm and dry.  Neurological:     Mental Status: She is alert.  Psychiatric:        Mood and Affect: Mood normal.      Musculoskeletal Exam:  Bilateral shoulder pain with pressure and with overhead abduction Right worse than left elbow decreased flexion and extension range of motion with hard endpoints, no palpable swelling no tenderness Bilateral wrist swelling, mildly tender, severely reduced ROM Right chronic MCP joint thickening with decreased flexion range of motion no palpable swelling or tenderness, left hand with nonreducible swan neck deformities fifth MCP and PIP joints painful to touch Knees full ROM b/l no effusions  Investigation: No additional findings.  Imaging: No results found.  Recent Labs: Lab Results  Component Value Date   WBC 7.1 04/21/2023   HGB 10.9 (L) 04/21/2023   PLT 453 (H) 04/21/2023   NA 140 04/21/2023   K 3.3 (L) 04/21/2023   CL 98 04/21/2023   CO2 30 04/21/2023   GLUCOSE 128 (H) 04/21/2023   BUN 9 04/21/2023   CREATININE 0.63 04/21/2023   BILITOT 0.6 04/21/2023   ALKPHOS 75 01/08/2021   AST 51 (H) 04/21/2023   ALT 26 04/21/2023   PROT 7.7 04/21/2023    ALBUMIN 4.0 01/08/2021   CALCIUM 9.5 04/21/2023   GFRAA 123 10/11/2020   QFTBGOLDPLUS NEGATIVE 04/21/2023    Speciality Comments: No  specialty comments available.  Procedures:  No procedures performed Allergies: Methotrexate derivatives, Peanut-containing drug products, Acyclovir and related, Aspirin, Egg-derived products, and Tomato   Assessment / Plan:     Visit Diagnoses: Rheumatoid arthritis involving multiple sites, unspecified whether rheumatoid factor present (HCC) - Plan: Sedimentation rate, predniSONE (DELTASONE) 5 MG tablet  Longstanding erosive seropositive RA still has disease activity appears much better today as compared to last visit but also off steroids since about 4 days ago.  Rechecking sedimentation rate for disease activity assessment.  If this indicates increase in active inflammation would recommend starting with short prednisone taper before resuming maintenance.  Plan to continue on Rinvoq 15 mg daily and resume prednisone 5 mg daily.  High risk medication use - plan to restart on Rinvoq 15 mg daily. - Plan: CBC with Differential/Platelet, COMPLETE METABOLIC PANEL WITH GFR  Checking CBC and CMP for medication monitoring on long-term use of Rinvoq.  No serious interval infections.  QuantiFERON screening was checked in June was negative.  Gastroesophageal reflux disease without esophagitis - omeprazole 20 mg  Change in vision   The acute onset visual complaints of left eye checked out after our last visit did not have any findings concerning for retinal inflammation or vasculitis.  Ophthalmology note consider possibly related to headaches or could be seen early in process for posterior vitreal detachment.   Orders: Orders Placed This Encounter  Procedures   Sedimentation rate   CBC with Differential/Platelet   COMPLETE METABOLIC PANEL WITH GFR   Meds ordered this encounter  Medications   predniSONE (DELTASONE) 5 MG tablet    Sig: Take 1 tablet (5 mg total)  by mouth daily with breakfast.    Dispense:  90 tablet    Refill:  0     Follow-Up Instructions: No follow-ups on file.   Fuller Plan, MD  Note - This record has been created using [employer].  Chart creation errors have been sought, but may not always  have been located. Such creation errors do not reflect on  the standard of medical care.

## 2023-07-14 ENCOUNTER — Other Ambulatory Visit: Payer: Self-pay

## 2023-07-14 DIAGNOSIS — K219 Gastro-esophageal reflux disease without esophagitis: Secondary | ICD-10-CM

## 2023-07-19 ENCOUNTER — Other Ambulatory Visit: Payer: Self-pay | Admitting: Internal Medicine

## 2023-07-19 DIAGNOSIS — K219 Gastro-esophageal reflux disease without esophagitis: Secondary | ICD-10-CM

## 2023-07-19 MED ORDER — OMEPRAZOLE 20 MG PO CPDR
20.0000 mg | DELAYED_RELEASE_CAPSULE | Freq: Every day | ORAL | 0 refills | Status: DC
Start: 2023-07-19 — End: 2023-10-04

## 2023-07-19 NOTE — Telephone Encounter (Signed)
Last Fill: 04/21/2023  Next Visit: 07/22/2023  Last Visit: 04/21/2023  Dx: Gastroesophageal reflux disease without esophagitis   Current Dose per office note on 04/21/2023: omeprazole 20 mg   Okay to refill Omeprazole?

## 2023-07-19 NOTE — Telephone Encounter (Signed)
Patient contacted the office to request a medication refill.   1. Name of Medication: Omeprazole  2. How are you currently taking this medication (dosage and times per day)? One tablet a day   3. What pharmacy would you like for that to be sent to? [facility] [address]

## 2023-07-22 ENCOUNTER — Ambulatory Visit: Payer: Medicaid Other | Attending: Internal Medicine | Admitting: Internal Medicine

## 2023-07-22 ENCOUNTER — Encounter: Payer: Self-pay | Admitting: Internal Medicine

## 2023-07-22 VITALS — BP 103/68 | HR 91 | Resp 14 | Ht 64.0 in | Wt 212.0 lb

## 2023-07-22 DIAGNOSIS — Z7952 Long term (current) use of systemic steroids: Secondary | ICD-10-CM

## 2023-07-22 DIAGNOSIS — K219 Gastro-esophageal reflux disease without esophagitis: Secondary | ICD-10-CM | POA: Diagnosis not present

## 2023-07-22 DIAGNOSIS — M069 Rheumatoid arthritis, unspecified: Secondary | ICD-10-CM

## 2023-07-22 DIAGNOSIS — H539 Unspecified visual disturbance: Secondary | ICD-10-CM

## 2023-07-22 DIAGNOSIS — Z79899 Other long term (current) drug therapy: Secondary | ICD-10-CM | POA: Diagnosis not present

## 2023-07-22 LAB — CBC WITH DIFFERENTIAL/PLATELET
Absolute Monocytes: 298 cells/uL (ref 200–950)
Basophils Absolute: 8 cells/uL (ref 0–200)
Basophils Relative: 0.2 %
Eosinophils Absolute: 71 cells/uL (ref 15–500)
Eosinophils Relative: 1.7 %
HCT: 35.1 % (ref 35.0–45.0)
Hemoglobin: 11.6 g/dL — ABNORMAL LOW (ref 11.7–15.5)
Lymphs Abs: 1911 cells/uL (ref 850–3900)
MCH: 30.9 pg (ref 27.0–33.0)
MCHC: 33 g/dL (ref 32.0–36.0)
MCV: 93.4 fL (ref 80.0–100.0)
MPV: 9.8 fL (ref 7.5–12.5)
Monocytes Relative: 7.1 %
Neutro Abs: 1911 cells/uL (ref 1500–7800)
Neutrophils Relative %: 45.5 %
Platelets: 372 10*3/uL (ref 140–400)
RBC: 3.76 10*6/uL — ABNORMAL LOW (ref 3.80–5.10)
RDW: 13.2 % (ref 11.0–15.0)
Total Lymphocyte: 45.5 %
WBC: 4.2 10*3/uL (ref 3.8–10.8)

## 2023-07-22 MED ORDER — PREDNISONE 5 MG PO TABS
5.0000 mg | ORAL_TABLET | Freq: Every day | ORAL | 0 refills | Status: DC
Start: 2023-07-22 — End: 2023-10-04

## 2023-07-23 LAB — COMPLETE METABOLIC PANEL WITH GFR
AG Ratio: 1.1 (calc) (ref 1.0–2.5)
ALT: 17 U/L (ref 6–29)
AST: 17 U/L (ref 10–35)
Albumin: 4.2 g/dL (ref 3.6–5.1)
Alkaline phosphatase (APISO): 83 U/L (ref 37–153)
BUN: 15 mg/dL (ref 7–25)
CO2: 27 mmol/L (ref 20–32)
Calcium: 9.1 mg/dL (ref 8.6–10.4)
Chloride: 104 mmol/L (ref 98–110)
Creat: 0.71 mg/dL (ref 0.50–1.03)
Globulin: 3.7 g/dL (ref 1.9–3.7)
Glucose, Bld: 101 mg/dL — ABNORMAL HIGH (ref 65–99)
Potassium: 3.8 mmol/L (ref 3.5–5.3)
Sodium: 140 mmol/L (ref 135–146)
Total Bilirubin: 0.4 mg/dL (ref 0.2–1.2)
Total Protein: 7.9 g/dL (ref 6.1–8.1)
eGFR: 99 mL/min/{1.73_m2} (ref >=60)

## 2023-07-23 LAB — SEDIMENTATION RATE: Sed Rate: >130 mm/h — ABNORMAL HIGH (ref 0–30)

## 2023-07-28 ENCOUNTER — Ambulatory Visit (INDEPENDENT_AMBULATORY_CARE_PROVIDER_SITE_OTHER): Payer: No Typology Code available for payment source | Admitting: Student

## 2023-07-28 ENCOUNTER — Encounter: Payer: Self-pay | Admitting: Student

## 2023-07-28 ENCOUNTER — Other Ambulatory Visit: Payer: Self-pay

## 2023-07-28 VITALS — BP 139/87 | HR 99 | Ht 64.0 in | Wt 214.0 lb

## 2023-07-28 DIAGNOSIS — L608 Other nail disorders: Secondary | ICD-10-CM

## 2023-07-28 NOTE — Progress Notes (Signed)
  SUBJECTIVE:   CHIEF COMPLAINT / HPI:   Nail fungus (left hand, ring finger) Color changed last week, otherwise not bothering her or hurting/itching. Does her own nails, but wears fake nails often. Left hand Ring Finger.    PERTINENT  PMH / PSH: RA  OBJECTIVE:  BP 139/87   Pulse 99   Ht 5\' 4"  (1.626 m)   Wt 214 lb (97.1 kg)   SpO2 97%   BMI 36.73 kg/m  Physical Exam Musculoskeletal:     Comments: Yellowing of distal nail located on left ring finger, and right ring and middle finger      ASSESSMENT/PLAN:  Nail discoloration Assessment & Plan: Patient reports nail discoloration and left ring finger, x 1 week.  Patient notes that she wears artificial nails frequently, but does them herself.  Patient does not go to nail salon's.  Patient denies any erythema or irritation.  Patient is on chronic prednisone for her RA, thus immunocompromised.  Nail discoloration likely coming from fungal infection.  Will recommend over-the-counter topical antifungal for nails.  Discussed patient risk of taking oral medication, and effect on liver, and thus we will start with over-the-counter topical medication. - Topical medication, OTC - Follow-up 3 to 6 months, sooner if needed discoloration continues to worsen    No follow-ups on file. Bess Kinds, MD 07/28/2023, 11:10 AM PGY-3, [facility]

## 2023-07-28 NOTE — Assessment & Plan Note (Signed)
Patient reports nail discoloration and left ring finger, x 1 week.  Patient notes that she wears artificial nails frequently, but does them herself.  Patient does not go to nail salon's.  Patient denies any erythema or irritation.  Patient is on chronic prednisone for her RA, thus immunocompromised.  Nail discoloration likely coming from fungal infection.  Will recommend over-the-counter topical antifungal for nails.  Discussed patient risk of taking oral medication, and effect on liver, and thus we will start with over-the-counter topical medication. - Topical medication, OTC - Follow-up 3 to 6 months, sooner if needed discoloration continues to worsen

## 2023-07-28 NOTE — Patient Instructions (Addendum)
It was great to see you! Thank you for allowing me to participate in your care!  I recommend that you always bring your medications to each appointment as this makes it easy to ensure we are on the correct medications and helps [place] not miss when refills are needed.  Our plans for today:  - Nail discoloration This could be coming from a fungus. Let's try over the counter nail-antifungals ointment to be applied daily. This can take quite some time to clear up. Allow for 4-6 months. If not better or getting worse, make a follow up appointment.     *We usually try over the counter medicine, as the prescribed medication can be bad for your liver.       Take care and seek immediate care sooner if you develop any concerns.   Dr. Bess Kinds, MD [facility]

## 2023-07-30 ENCOUNTER — Other Ambulatory Visit: Payer: Self-pay | Admitting: Internal Medicine

## 2023-07-30 DIAGNOSIS — M069 Rheumatoid arthritis, unspecified: Secondary | ICD-10-CM

## 2023-07-30 NOTE — Telephone Encounter (Signed)
LMOM for patient to schedule 3 month follow-up appointment

## 2023-07-30 NOTE — Telephone Encounter (Signed)
Please schedule patient a follow up visit. Patient due December 2024. Thanks!

## 2023-07-30 NOTE — Telephone Encounter (Signed)
Last Fill: 05/07/2023  Labs: 07/22/2023 RBC 3.76, Hgb 11.6, Glucose 101  TB Gold: 04/21/2023 Neg   Next Visit: Due December 2024. Message sent to the front to schedule.   Last Visit: 07/22/2023  DX: Rheumatoid arthritis involving multiple sites, unspecified whether rheumatoid factor present   Current Dose per office note 07/22/2023: Rinvoq 15 mg daily   Okay to refill Rinvoq?

## 2023-07-30 NOTE — Progress Notes (Signed)
Sedimentation rate remains very high greater than 130.  Blood count kidney and liver function look fine.  I recommend she needs to go back onto the low-dose prednisone in addition to her Rinvoq.  We should schedule a follow-up appointment make sure she returns within 3 months so we do not keep having an interruption for her medicines.

## 2023-08-10 ENCOUNTER — Other Ambulatory Visit: Payer: Self-pay | Admitting: Internal Medicine

## 2023-08-10 ENCOUNTER — Other Ambulatory Visit: Payer: Self-pay | Admitting: Student

## 2023-08-10 DIAGNOSIS — M069 Rheumatoid arthritis, unspecified: Secondary | ICD-10-CM

## 2023-08-10 DIAGNOSIS — K219 Gastro-esophageal reflux disease without esophagitis: Secondary | ICD-10-CM

## 2023-08-11 ENCOUNTER — Other Ambulatory Visit: Payer: Self-pay

## 2023-08-11 DIAGNOSIS — M069 Rheumatoid arthritis, unspecified: Secondary | ICD-10-CM

## 2023-08-11 MED ORDER — TIZANIDINE HCL 4 MG PO TABS
ORAL_TABLET | ORAL | 2 refills | Status: DC
Start: 2023-08-11 — End: 2023-11-15

## 2023-08-11 NOTE — Telephone Encounter (Signed)
Last Fill: 05/17/2023  Next Visit: 11/02/2023  Last Visit: 07/22/2023  Dx: not mentioned  Current Dose per office note on 07/22/2023: not mentioned  Okay to refill Tizanidine?

## 2023-09-06 ENCOUNTER — Other Ambulatory Visit: Payer: Self-pay | Admitting: Internal Medicine

## 2023-09-06 DIAGNOSIS — K219 Gastro-esophageal reflux disease without esophagitis: Secondary | ICD-10-CM

## 2023-09-10 ENCOUNTER — Other Ambulatory Visit: Payer: Self-pay | Admitting: Student

## 2023-09-10 ENCOUNTER — Other Ambulatory Visit: Payer: Self-pay | Admitting: Internal Medicine

## 2023-09-10 DIAGNOSIS — K219 Gastro-esophageal reflux disease without esophagitis: Secondary | ICD-10-CM

## 2023-09-10 DIAGNOSIS — R059 Cough, unspecified: Secondary | ICD-10-CM

## 2023-09-10 DIAGNOSIS — I10 Essential (primary) hypertension: Secondary | ICD-10-CM

## 2023-09-10 MED ORDER — FLUTICASONE-SALMETEROL 250-50 MCG/ACT IN AEPB: 1.0000 | INHALATION_SPRAY | Freq: Two times a day (BID) | RESPIRATORY_TRACT | 12 refills | Status: DC

## 2023-10-02 ENCOUNTER — Other Ambulatory Visit: Payer: Self-pay | Admitting: Internal Medicine

## 2023-10-02 DIAGNOSIS — K219 Gastro-esophageal reflux disease without esophagitis: Secondary | ICD-10-CM

## 2023-10-02 DIAGNOSIS — M069 Rheumatoid arthritis, unspecified: Secondary | ICD-10-CM

## 2023-10-04 NOTE — Telephone Encounter (Signed)
Last Fill: 07/19/2023 Omeprazole 07/22/2023 Prednisone  Next Visit: 11/02/2023  Last Visit: 07/22/2023  Dx: Gastroesophageal reflux disease without esophagitis, Rheumatoid arthritis involving multiple sites, unspecified whether rheumatoid factor present   Current Dose per office note on 07/22/2023: omeprazole 20 mg, prednisone 5 mg daily.   Okay to refill Omeprazole and Prednisone?

## 2023-10-21 NOTE — Progress Notes (Signed)
 Office Visit Note  Patient: Kathy [name]             Date of Birth: 1965-11-04           MRN: [id number]             PCP: Jennelle Riis, MD Referring: Jennelle Riis, MD Visit Date: 11/02/2023   Subjective:  Follow-up (Doing fair)    Discussed the use of AI scribe software for clinical note transcription with the patient, who gave verbal consent to proceed.  History of Present Illness   Kathy [name] is a 57 y.o. female here for follow up for seropositive RA was on Rinvoq  15 mg daily and prednisone  5 mg daily. She reports symptoms are doing fair. They report intermittent adherence to Rinvoq  in the past, which led to flares of their arthritis. Recently, they have been taking Rinvoq  consistently and report no issues with the medication.  The patient describes their joint health as variable, with good and bad days. The most significant discomfort is reported in the shoulders. They deny any recent swelling in the hands. They also report a recent minor cold.  The patient also experiences intermittent numbness in their hands, which they describe as a cramping sensation affecting the entire hand. This numbness is sporadic and can wake them up at night. They also report some tenderness in the right shoulder and occasional soreness in the breast.     07/22/2023 Kathy [name] is a 57 y.o. female here for follow up for seropositive RA was on Rinvoq  15 mg daily and prednisone  5 mg daily but ran out of her medications due to delay in her clinic follow-up.  She is off the prednisone  since about 4 days ago has noticed increased pain in her left hand but it is inconsistent worse in the mornings and coming and going during the day.  Before that feels symptoms are doing pretty well still had intermittent numbness and cramping again worse in the left hand.  Did not have very prolonged morning stiffness while she had both her medications.   04/21/2023 Kathy [name] is a 57 y.o. female  here for follow up for seropositive RA was on Rinvoq  15 mg daily and prednisone  5 mg daily but ran out of her medications due to delay in her clinic follow-up.  Originally was scheduled to be seen in March.  So she has been off her medicine in the past month.  Symptoms were doing well while on this regimen.  But now having a flare up with increased joint pain and swelling for the past week and a half. Worst in her shoulders and hands also swelling in right ankle. She also reports new complaint about left eye today with some pressure and new gray and purple spots in her vision.   10/14/22 Kathy [name] is a 57 y.o. female here for follow up for seropositive RA on rinvoq  15 mg daily and prednisone  5 mg daily. She feels symptoms are pretty stable does still have considerable pain often in her feet and and shoulders most frequently. She was in [place]  for 3 months over the interval so off her rinvoq  for a while resumed this before thanksgiving. She also feels some increased aches with the colder weather change. Taking the tizanidine  4 mg with her other medications once daily as well.   03/12/22 Kathy [name] is a 57 y.o. female here for follow up for seropositive RA on rinvoq  15 mg PO daily and  prednisone  5 mg daily.  She has increased pain and stiffness in her left shoulder she notices this more with weather changes and sometimes takes prednisone  up to twice daily for this. She is off rinvoq  again about 2 weeks due to lack of refills.   12/08/2021 Kathy [name] is a 57 y.o. female here for follow up for seropositive RA on rinvoq  15 mg PO daily and prednisone  trying to taper off from 5 mg daily. She has felt improvement with resuming rinvoq  symptoms are mostly good. She is off the medicine again since about 1 week ago from running out. She takes the 5 mg prednisone  one in the morning on days where she has noticeably increased pain most often with cold and weather changes. She has shoulder pain at  night also bothering her but not severely limiting any activities.    09/02/21 Kathy [name] is a 57 y.o. female here for follow up for seropositive RA on Rinvoq  15 mg p.o. daily. Recommended trying to taper off the remaining low-dose prednisone  if symptoms were well controlled since the last visit. She missed her last appointment due to bronchitis and so is off the rinvoq  for at least 2 months. This did not cause a severe flare but she definitely feels a difference and increased prednisone  use, She has been taking prednisone  5 mg daily whenever she notices more joint pain than usual often associated with weather changes. She takes 10 mg rarely sometimes in raining weather.    05/09/21 Kathy [name] is a 57 y.o. female here for follow up for seropositive rheumatoid arthritis on Rinvoq  15 mg daily Plaquenil  200 mg daily and prednisone  5 mg daily.  After her last visit she restarted the Rinvoq  due to lapse in treatment for a period of time.  She feels her symptoms are currently well controlled on this regimen and has no complaints.   10/11/20 Kathy [name] is a 57 y.o. female here to establish care of rheumatoid arthritis for which she previous saw Dr. Ziolkowski in [facility] but is looking for an office closer to her residence due to difficulty with transportation. She has a history of highly active RA for years with pain that is worst in her shoulders, hands, knees, and feet with deforming arthritis of hands and feet and has had bilateral knee replacements for arthritis. Previous rheumatologists Dr. Levitin and Dr. Ishmael. Her most recent visit with [place] office was in October with disease in exacerbation at that time and she took a prednisone  taper but has remained active taking 15mg  of prednisone  currently 10mg  AM and 5mg  PM.    Review of Systems  Constitutional:  Negative for fatigue.  HENT:  Negative for mouth sores and mouth dryness.   Eyes:  Negative for dryness.  Respiratory:   Negative for shortness of breath.   Cardiovascular:  Negative for chest pain and palpitations.  Gastrointestinal:  Negative for blood in stool, constipation and diarrhea.  Endocrine: Negative for increased urination.  Genitourinary:  Negative for involuntary urination.  Musculoskeletal:  Positive for joint pain, joint pain, myalgias, muscle weakness, muscle tenderness and myalgias. Negative for gait problem, joint swelling and morning stiffness.  Skin:  Negative for color change, rash, hair loss and sensitivity to sunlight.  Allergic/Immunologic: Negative for susceptible to infections.  Neurological:  Negative for dizziness and headaches.  Hematological:  Negative for swollen glands.  Psychiatric/Behavioral:  Negative for depressed mood and sleep disturbance. The patient is not nervous/anxious.  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   Anemia 09/18/2014   Cough 02/04/2010   Rheumatoid arthritis (HCC) 09/15/2009   Generalized osteoarthritis of multiple sites 07/28/2007    Past Medical History:  Diagnosis Date   Asthma    seasonal   GERD (gastroesophageal reflux disease)    Rheumatoid arteritis (HCC)     Family History  Problem Relation Age of Onset   Asthma Mother    Diabetes Mother    Hypertension Mother    Heart disease Mother    Asthma Father    Cancer Father        lung   Lung cancer Sister    Cancer Sister        breast at 33    Lupus Sister    Cancer Brother        unknown   Past Surgical History:  Procedure Laterality Date   ABDOMINAL HYSTERECTOMY     TOTAL KNEE ARTHROPLASTY Right 06/18/2015   Procedure: RIGHT TOTAL KNEE ARTHROPLASTY;  Surgeon: Lonni CINDERELLA Poli, MD;  Location: [facility] OR;  Service: Orthopedics;  Laterality: Right;   TOTAL KNEE ARTHROPLASTY Left 05/11/2017   Procedure: LEFT TOTAL KNEE ARTHROPLASTY;  Surgeon: Poli Lonni CINDERELLA, MD;  Location: [facility] OR;  Service: Orthopedics;  Laterality: Left;   Social History   Social History Narrative   Not on file   Immunization History  Administered Date(s) Administered   Influenza,inj,Quad PF,6+ Mos 09/19/2014, 07/23/2015, 08/04/2018, 07/08/2020, 10/30/2021   Influenza,inj,Quad PF,6-35 Mos 11/16/2017   PFIZER Comirnaty(Gray Top)Covid-19 Tri-Sucrose Vaccine 11/22/2020   PFIZER(Purple Top)SARS-COV-2 Vaccination 02/29/2020, 03/26/2020   Pneumococcal Polysaccharide-23 10/26/2002   Tdap 07/03/2016, 08/04/2018     Objective: Vital Signs: BP 117/80 (BP Location: Left Arm, Patient Position: Sitting, Cuff Size: Normal)   Pulse (!) 111   Resp 15   Ht 5' 4 (1.626 m)   Wt 219 lb (99.3 kg)   BMI 37.59 kg/m    Physical Exam Eyes:     Conjunctiva/sclera: Conjunctivae normal.  Cardiovascular:     Rate and Rhythm: Normal rate and regular rhythm.  Pulmonary:     Effort: Pulmonary effort is normal.     Breath sounds: Normal breath sounds.  Musculoskeletal:     Right lower leg: No edema.     Left lower leg: No edema.  Lymphadenopathy:     Cervical: No cervical adenopathy.  Skin:    General: Skin is warm and dry.     Findings: No rash.  Neurological:     Mental Status: She is alert.  Psychiatric:        Mood and Affect: Mood normal.      Musculoskeletal Exam: Right shoulder tenderness to pressure on anterior and posterior aspects Right elbow limited extension to approximately 110 degrees and flexion to about 70 degrees Limited flexion  range of motion in both wrists with mild ulnar subluxation Swan neck deformities in fingers, more advanced on left hand affecting third through fifth fingers, no palpable swelling, flexion  range of motion mildly limited Knees full ROM on both sides with crepitus and mild pain Left fourth and fifth toes cocked up, similar findings on right foot affecting second through fifth toes   Investigation: No additional findings.  Imaging: No results found.  Recent Labs: Lab Results  Component Value Date   WBC 4.2 07/22/2023   HGB 11.6 (L) 07/22/2023   PLT 372 07/22/2023   NA 140 07/22/2023   K 3.8 07/22/2023   CL 104 07/22/2023   CO2 27 07/22/2023   GLUCOSE 101 (H) 07/22/2023   BUN 15 07/22/2023   CREATININE 0.71 07/22/2023   BILITOT 0.4 07/22/2023   ALKPHOS 75 01/08/2021   AST 17 07/22/2023   ALT 17 07/22/2023   PROT 7.9 07/22/2023   ALBUMIN 4.0 01/08/2021   CALCIUM  9.1 07/22/2023   GFRAA 123 10/11/2020   QFTBGOLDPLUS NEGATIVE 04/21/2023    Speciality Comments: No specialty comments available.  Procedures:  No procedures performed Allergies: Methotrexate  derivatives, Peanut-containing drug products, Acyclovir and related, Aspirin , Egg-derived products, and Tomato   Assessment / Plan:     Visit Diagnoses: Rheumatoid arthritis involving multiple sites, unspecified whether rheumatoid factor present (HCC) - Plan: predniSONE  (DELTASONE ) 5 MG tablet, Upadacitinib  ER (RINVOQ ) 15 MG TB24, Sedimentation rate Patient reports intermittent shoulder pain and hand numbness. No swollen finger joints noted on examination. Patient has been taking Rinvoq  and prednisone  consistently. Sedimentation rate was >130 at last check, indicating active disease. -Continue Rinvoq  15 mg and prednisone  5 mg daily. -Draw blood for sedimentation rate and other relevant labs to assess disease activity. -Consider referral to physical therapy for shoulder pain if it persists or worsens  High risk medication use -  Rinvoq  15 mg daily - Plan: CBC with Differential/Platelet, COMPLETE METABOLIC PANEL WITH GFR No serious interval infections -Checking CBC and CMP medication monitoring on Rinvoq  and prednisone .  Long term (current) use of systemic steroids - Prednisone  5 mg daily.  Gastroesophageal reflux disease without esophagitis - omeprazole  20 mg  Hand Numbness Intermittent numbness in both hands, possibly due to nerve impingement from arthritis in the shoulder and wrist. -Monitor symptoms. If numbness persists or worsens, consider further evaluation.   Orders: Orders Placed This Encounter  Procedures   Sedimentation rate   CBC with Differential/Platelet   COMPLETE METABOLIC PANEL WITH GFR   Meds ordered this encounter  Medications   predniSONE  (DELTASONE ) 5 MG tablet    Sig: Take 1 tablet (5 mg total) by mouth daily with breakfast.    Dispense:  90 tablet    Refill:  0   Upadacitinib  ER (RINVOQ ) 15 MG TB24    Sig: Take 1 tablet (15 mg total) by mouth daily.    Dispense:  90 tablet    Refill:  0    Prescription Type::   Renewal     Follow-Up Instructions: No follow-ups on file.   Lonni LELON Ester, MD  Note - This record has been created using [employer].  Chart creation errors have been sought, but may not always  have been located. Such creation errors do not reflect on  the standard of medical care.

## 2023-10-27 ENCOUNTER — Telehealth: Payer: Self-pay | Admitting: Pharmacist

## 2023-10-27 NOTE — Telephone Encounter (Signed)
 Received notification from [facility] Medicaid regarding a prior authorization for RINVOQ . Authorization has been APPROVED from 10/27/2023 to 10/26/2024. Approval letter sent to scan center.  Authorization # [id number]  Sherry Pennant, PharmD, MPH, BCPS, CPP Clinical Pharmacist (Rheumatology and Pulmonology)

## 2023-10-27 NOTE — Telephone Encounter (Signed)
 Submitted a Prior Authorization RENEWAL request to [facility] Medicaid for [facility] via CoverMyMeds. Will update once we receive a response.  Key: [id number]

## 2023-11-02 ENCOUNTER — Ambulatory Visit: Payer: Medicaid Other | Attending: Internal Medicine | Admitting: Internal Medicine

## 2023-11-02 ENCOUNTER — Encounter: Payer: Self-pay | Admitting: Internal Medicine

## 2023-11-02 VITALS — BP 117/80 | HR 111 | Resp 15 | Ht 64.0 in | Wt 219.0 lb

## 2023-11-02 DIAGNOSIS — Z7952 Long term (current) use of systemic steroids: Secondary | ICD-10-CM | POA: Diagnosis not present

## 2023-11-02 DIAGNOSIS — K219 Gastro-esophageal reflux disease without esophagitis: Secondary | ICD-10-CM | POA: Diagnosis not present

## 2023-11-02 DIAGNOSIS — Z79899 Other long term (current) drug therapy: Secondary | ICD-10-CM

## 2023-11-02 DIAGNOSIS — M069 Rheumatoid arthritis, unspecified: Secondary | ICD-10-CM

## 2023-11-02 DIAGNOSIS — H539 Unspecified visual disturbance: Secondary | ICD-10-CM

## 2023-11-02 MED ORDER — RINVOQ 15 MG PO TB24: 15.0000 mg | ORAL_TABLET | Freq: Every day | ORAL | 0 refills | Status: DC

## 2023-11-02 MED ORDER — PREDNISONE 5 MG PO TABS: 5.0000 mg | ORAL_TABLET | Freq: Every day | ORAL | 0 refills | Status: DC

## 2023-11-03 LAB — COMPLETE METABOLIC PANEL WITH GFR
AG Ratio: 1.2 (calc) (ref 1.0–2.5)
ALT: 19 U/L (ref 6–29)
AST: 26 U/L (ref 10–35)
Albumin: 4.3 g/dL (ref 3.6–5.1)
Alkaline phosphatase (APISO): 67 U/L (ref 37–153)
BUN: 11 mg/dL (ref 7–25)
CO2: 29 mmol/L (ref 20–32)
Calcium: 9.2 mg/dL (ref 8.6–10.4)
Chloride: 102 mmol/L (ref 98–110)
Creat: 0.68 mg/dL (ref 0.50–1.03)
Globulin: 3.7 g/dL (ref 1.9–3.7)
Glucose, Bld: 97 mg/dL (ref 65–99)
Potassium: 3.9 mmol/L (ref 3.5–5.3)
Sodium: 141 mmol/L (ref 135–146)
Total Bilirubin: 0.5 mg/dL (ref 0.2–1.2)
Total Protein: 8 g/dL (ref 6.1–8.1)
eGFR: 102 mL/min/{1.73_m2} (ref >=60)

## 2023-11-03 LAB — CBC WITH DIFFERENTIAL/PLATELET
Absolute Lymphocytes: 2400 {cells}/uL (ref 850–3900)
Absolute Monocytes: 371 {cells}/uL (ref 200–950)
Basophils Absolute: 23 {cells}/uL (ref 0–200)
Basophils Relative: 0.4 %
Eosinophils Absolute: 68 {cells}/uL (ref 15–500)
Eosinophils Relative: 1.2 %
HCT: 34.9 % — ABNORMAL LOW (ref 35.0–45.0)
Hemoglobin: 11.8 g/dL (ref 11.7–15.5)
MCH: 32.7 pg (ref 27.0–33.0)
MCHC: 33.8 g/dL (ref 32.0–36.0)
MCV: 96.7 fL (ref 80.0–100.0)
MPV: 9.9 fL (ref 7.5–12.5)
Monocytes Relative: 6.5 %
Neutro Abs: 2839 {cells}/uL (ref 1500–7800)
Neutrophils Relative %: 49.8 %
Platelets: 357 10*3/uL (ref 140–400)
RBC: 3.61 10*6/uL — ABNORMAL LOW (ref 3.80–5.10)
RDW: 12.4 % (ref 11.0–15.0)
Total Lymphocyte: 42.1 %
WBC: 5.7 10*3/uL (ref 3.8–10.8)

## 2023-11-03 LAB — SEDIMENTATION RATE: Sed Rate: >130 mm/h — ABNORMAL HIGH (ref 0–30)

## 2023-11-14 ENCOUNTER — Other Ambulatory Visit: Payer: Self-pay | Admitting: Internal Medicine

## 2023-11-14 DIAGNOSIS — M069 Rheumatoid arthritis, unspecified: Secondary | ICD-10-CM

## 2023-11-14 DIAGNOSIS — K219 Gastro-esophageal reflux disease without esophagitis: Secondary | ICD-10-CM

## 2023-11-15 MED ORDER — TIZANIDINE HCL 4 MG PO TABS: ORAL_TABLET | ORAL | 2 refills | Status: DC

## 2023-11-15 NOTE — Telephone Encounter (Signed)
Please schedule patient a follow up visit. Patient due April 2025. Thanks!

## 2023-11-15 NOTE — Telephone Encounter (Signed)
Last Fill: 08/11/2023  Next Visit: Due April 2025. Message sent to the front to schedule.   Last Visit: 11/02/2023  Dx:  Rheumatoid arthritis involving multiple sites, unspecified whether rheumatoid factor present   Current Dose per office note on 11/02/2023: not dicussed  Okay to refill Tizanidine?

## 2023-11-16 NOTE — Telephone Encounter (Signed)
 Attempted to contact patient and left message to advise patient to call the office and schedule patient a follow up appointment.

## 2023-12-02 ENCOUNTER — Telehealth: Payer: Self-pay

## 2023-12-02 NOTE — Telephone Encounter (Signed)
 Patient contacted the office back. Patient states she will contact [facility] regarding her Rinvoq .

## 2023-12-02 NOTE — Telephone Encounter (Signed)
 Received a fax from [facility] stating they have been trying to contact the patient regarding their Rinvoq . Attempted to contact the patient and left a message and advised the patient to contact [facility] at [phone].

## 2023-12-07 ENCOUNTER — Telehealth: Payer: Self-pay | Admitting: Pharmacist

## 2023-12-07 DIAGNOSIS — M069 Rheumatoid arthritis, unspecified: Secondary | ICD-10-CM

## 2023-12-07 NOTE — Telephone Encounter (Signed)
Submitted a Prior Authorization request to [facility] MEDICAID for [facility] via CoverMyMeds. Will update once we receive a response.  Key: [id number]

## 2023-12-08 NOTE — Telephone Encounter (Signed)
Received notification from [facility] MEDICAID regarding a prior authorization for [facility]. Authorization has been APPROVED from 12/07/2023 to 12/06/2024. Approval letter sent to scan center.  Patient can continue to fill through [facility]: [phone]  Authorization # [id number]  Chesley Mires, PharmD, MPH, BCPS, CPP Clinical Pharmacist (Rheumatology and Pulmonology)

## 2023-12-10 MED ORDER — RINVOQ 15 MG PO TB24: 15.0000 mg | ORAL_TABLET | Freq: Every day | ORAL | 0 refills | Status: DC

## 2023-12-10 NOTE — Telephone Encounter (Signed)
Advised the patient we sent her Rinvoq to [facility] and gave her the number to the pharmacy. Patient verbalized understanding.

## 2023-12-10 NOTE — Addendum Note (Signed)
Addended by: Metta Clines on: 12/10/2023 08:57 AM   Modules accepted: Orders

## 2023-12-10 NOTE — Telephone Encounter (Signed)
Received a fax from [facility] not specifying a medication. After reviewing the chart, the only medication sent by our office to [facility] is Rinvoq. [facility] states the pharmacy is not in the patient's insurance network and [facility] is. The pharmacy requests we send a new prescription to [facility]. The phone number for [facility] provided is [phone] and the fax number is [phone].   Attempted to contact the patient and left a message to call the office back.

## 2023-12-12 ENCOUNTER — Other Ambulatory Visit: Payer: Self-pay | Admitting: Internal Medicine

## 2023-12-12 DIAGNOSIS — K219 Gastro-esophageal reflux disease without esophagitis: Secondary | ICD-10-CM

## 2023-12-28 ENCOUNTER — Ambulatory Visit: Payer: No Typology Code available for payment source | Admitting: Student

## 2023-12-28 NOTE — Progress Notes (Deleted)
  SUBJECTIVE:   CHIEF COMPLAINT / HPI:   Headaches  PERTINENT  PMH / PSH: ***  Past Medical History:  Diagnosis Date   Asthma    seasonal   GERD (gastroesophageal reflux disease)    Rheumatoid arteritis (HCC)    OBJECTIVE:  There were no vitals taken for this visit. ***  ASSESSMENT/PLAN:   Assessment & Plan  No follow-ups on file. Bess Kinds, MD 12/28/2023, 6:48 AM PGY-***, [facility] {    This will disappear when note is signed, click to select method of visit    :1}

## 2024-01-05 ENCOUNTER — Other Ambulatory Visit: Payer: Self-pay | Admitting: Internal Medicine

## 2024-01-05 ENCOUNTER — Other Ambulatory Visit: Payer: Self-pay

## 2024-01-05 DIAGNOSIS — K219 Gastro-esophageal reflux disease without esophagitis: Secondary | ICD-10-CM

## 2024-01-05 MED ORDER — OMEPRAZOLE 20 MG PO CPDR: 20.0000 mg | DELAYED_RELEASE_CAPSULE | Freq: Every day | ORAL | 0 refills | Status: DC

## 2024-01-05 NOTE — Telephone Encounter (Signed)
 Last Fill: 10/04/2023  Next Visit: 02/14/2024  Last Visit: 11/02/2023  Dx: Gastroesophageal reflux disease without esophagitis   Current Dose per office note on 11/02/2023: omeprazole 20 mg   Okay to refill Omeprazole?

## 2024-02-01 NOTE — Progress Notes (Signed)
 Office Visit Note  Patient: Kathy [name]             Date of Birth: 1966/03/26           MRN: [id number]             PCP: Wilhemena Harbour, MD Referring: Wilhemena Harbour, MD Visit Date: 02/14/2024   Subjective:  Other (Patient reports right leg and left knee pain, denies swelling. She has been out of rinvoq  for 3 weeks approximately. )   History of Present Illness: Kathy [name] is a 58 y.o. female here for follow up for seropositive RA was on Rinvoq  15 mg daily and prednisone  5 mg daily.  She has been off Rinvoq  for about 3 weeks due to needing a prescription refill and needing to follow-up for medication monitoring appointment.  She has still been on her prednisone  5 mg once daily took the last dose today.  Since being off the Rinvoq  does have increased joint pain and stiffness in a couple areas worst at the right shoulder and in her legs and knees.  The right shoulder pain has been persistent even while on her treatment and she rates the severity as 8 out of 10 on many days and usually gets worse at nighttime.  Previous HPI 11/02/2023 Kathy [name] is a 58 y.o. female here for follow up for seropositive RA was on Rinvoq  15 mg daily and prednisone  5 mg daily. She reports symptoms are doing fair. They report intermittent adherence to Rinvoq  in the past, which led to flares of their arthritis. Recently, they have been taking Rinvoq  consistently and report no issues with the medication.   The patient describes their joint health as variable, with good and bad days. The most significant discomfort is reported in the shoulders. They deny any recent swelling in the hands. They also report a recent minor cold.   The patient also experiences intermittent numbness in their hands, which they describe as a cramping sensation affecting the entire hand. This numbness is sporadic and can wake them up at night. They also report some tenderness in the right shoulder and occasional soreness in the  breast.       07/22/2023 Kathy [name] is a 58 y.o. female here for follow up for seropositive RA was on Rinvoq  15 mg daily and prednisone  5 mg daily but ran out of her medications due to delay in her clinic follow-up.  She is off the prednisone  since about 4 days ago has noticed increased pain in her left hand but it is inconsistent worse in the mornings and coming and going during the day.  Before that feels symptoms are doing pretty well still had intermittent numbness and cramping again worse in the left hand.  Did not have very prolonged morning stiffness while she had both her medications.   04/21/2023 Kathy [name] is a 58 y.o. female here for follow up for seropositive RA was on Rinvoq  15 mg daily and prednisone  5 mg daily but ran out of her medications due to delay in her clinic follow-up.  Originally was scheduled to be seen in March.  So she has been off her medicine in the past month.  Symptoms were doing well while on this regimen.  But now having a flare up with increased joint pain and swelling for the past week and a half. Worst in her shoulders and hands also swelling in right ankle. She also reports new complaint about left eye today  with some pressure and new gray and purple spots in her vision.   10/14/22 Kathy [name] is a 58 y.o. female here for follow up for seropositive RA on rinvoq  15 mg daily and prednisone  5 mg daily. She feels symptoms are pretty stable does still have considerable pain often in her feet and and shoulders most frequently. She was in [place]  for 3 months over the interval so off her rinvoq  for a while resumed this before thanksgiving. She also feels some increased aches with the colder weather change. Taking the tizanidine  4 mg with her other medications once daily as well.   03/12/22 Kathy [name] is a 58 y.o. female here for follow up for seropositive RA on rinvoq  15 mg PO daily and prednisone  5 mg daily.  She has increased pain and stiffness  in her left shoulder she notices this more with weather changes and sometimes takes prednisone  up to twice daily for this. She is off rinvoq  again about 2 weeks due to lack of refills.   12/08/2021 Kathy [name] is a 58 y.o. female here for follow up for seropositive RA on rinvoq  15 mg PO daily and prednisone  trying to taper off from 5 mg daily. She has felt improvement with resuming rinvoq  symptoms are mostly good. She is off the medicine again since about 1 week ago from running out. She takes the 5 mg prednisone  one in the morning on days where she has noticeably increased pain most often with cold and weather changes. She has shoulder pain at night also bothering her but not severely limiting any activities.    09/02/21 Kathy [name] is a 58 y.o. female here for follow up for seropositive RA on Rinvoq  15 mg p.o. daily. Recommended trying to taper off the remaining low-dose prednisone  if symptoms were well controlled since the last visit. She missed her last appointment due to bronchitis and so is off the rinvoq  for at least 2 months. This did not cause a severe flare but she definitely feels a difference and increased prednisone  use, She has been taking prednisone  5 mg daily whenever she notices more joint pain than usual often associated with weather changes. She takes 10 mg rarely sometimes in raining weather.    05/09/21 Kathy [name] is a 58 y.o. female here for follow up for seropositive rheumatoid arthritis on Rinvoq  15 mg daily Plaquenil  200 mg daily and prednisone  5 mg daily.  After her last visit she restarted the Rinvoq  due to lapse in treatment for a period of time.  She feels her symptoms are currently well controlled on this regimen and has no complaints.   10/11/20 Kathy [name] is a 58 y.o. female here to establish care of rheumatoid arthritis for which she previous saw Dr. Ziolkowski in [facility] but is looking for an office closer to her residence due to difficulty with  transportation. She has a history of highly active RA for years with pain that is worst in her shoulders, hands, knees, and feet with deforming arthritis of hands and feet and has had bilateral knee replacements for arthritis. Previous rheumatologists Dr. Levitin and Dr. Meredith Stalls. Her most recent visit with [place] office was in October with disease in exacerbation at that time and she took a prednisone  taper but has remained active taking 15mg  of prednisone  currently 10mg  AM and 5mg  PM.    Review of Systems  Constitutional:  Negative for fatigue.  HENT:  Negative for mouth sores and mouth  dryness.   Eyes:  Negative for dryness.  Respiratory:  Negative for shortness of breath.   Cardiovascular:  Negative for chest pain and palpitations.  Gastrointestinal:  Negative for blood in stool, constipation and diarrhea.  Endocrine: Negative for increased urination.  Genitourinary:  Negative for involuntary urination.  Musculoskeletal:  Positive for joint pain, joint pain, myalgias, morning stiffness and myalgias. Negative for gait problem, joint swelling, muscle weakness and muscle tenderness.  Skin:  Negative for color change, rash, hair loss and sensitivity to sunlight.  Allergic/Immunologic: Negative for susceptible to infections.  Neurological:  Positive for headaches. Negative for dizziness.  Hematological:  Negative for swollen glands.  Psychiatric/Behavioral:  Positive for sleep disturbance. Negative for depressed mood. The patient is not nervous/anxious.     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   Anemia 09/18/2014   Cough 02/04/2010   Rheumatoid arthritis (HCC) 09/15/2009   Generalized osteoarthritis of multiple sites 07/28/2007    Past Medical History:  Diagnosis Date   Asthma    seasonal   GERD (gastroesophageal reflux disease)    Rheumatoid arteritis (HCC)     Family History  Problem Relation Age of Onset   Asthma Mother    Diabetes Mother    Hypertension Mother    Heart disease Mother    Heart disease Father    Asthma Father    Cancer Father        lung   Lung cancer Sister    Cancer Sister        breast at 66   Lupus Sister    Cancer Brother        unknown   Cancer Nephew    Diabetes Nephew    Healthy Son    Past Surgical History:  Procedure Laterality Date   ABDOMINAL HYSTERECTOMY     TOTAL KNEE ARTHROPLASTY Right 06/18/2015   Procedure: RIGHT TOTAL KNEE ARTHROPLASTY;  Surgeon: Arnie Lao, MD;  Location: [facility] OR;  Service: Orthopedics;  Laterality: Right;   TOTAL KNEE ARTHROPLASTY Left 05/11/2017   Procedure: LEFT TOTAL KNEE ARTHROPLASTY;  Surgeon: Arnie Lao, MD;  Location: [facility] OR;  Service: Orthopedics;  Laterality: Left;   Social History   Social History Narrative   Not on file   Immunization History  Administered Date(s) Administered   Influenza,inj,Quad PF,6+ Mos 09/19/2014, 07/23/2015, 08/04/2018, 07/08/2020, 10/30/2021   Influenza,inj,Quad PF,6-35 Mos 11/16/2017   PFIZER Comirnaty(Gray Top)Covid-19 Tri-Sucrose Vaccine 11/22/2020   PFIZER(Purple Top)SARS-COV-2 Vaccination 02/29/2020, 03/26/2020   Pneumococcal Polysaccharide-23 10/26/2002   Tdap 07/03/2016, 08/04/2018     Objective: Vital Signs: BP 126/84 (BP Location: Right Wrist, Patient Position: Sitting, Cuff Size: Normal)   Pulse (!) 101   Resp 16   Ht 5\' 4"  (1.626 m)   Wt 218 lb 3.2 oz (99 kg)   BMI  37.45 kg/m    Physical Exam Cardiovascular:     Rate and Rhythm: Normal rate and regular rhythm.  Pulmonary:     Effort: Pulmonary effort  is normal.     Breath sounds: Normal breath sounds.  Skin:    General: Skin is warm and dry.  Neurological:     Mental Status: She is alert.  Psychiatric:        Mood and Affect: Mood normal.      Musculoskeletal Exam:  Right shoulder tenderness to pressure on anterior and posterior aspects, crepitus, decreased abduction and external ROM passive or active Right elbow limited extension to approximately 110 degrees and flexion to about 70 degrees Limited flexion range of motion in both wrists with mild ulnar subluxation Swan neck deformities in fingers, more advanced on left hand affecting third through fifth fingers, no palpable swelling, flexion range of motion mildly limited Knees full ROM on both sides with crepitus and mild pain  Investigation: No additional findings.  Imaging: No results found.  Recent Labs: Lab Results  Component Value Date   WBC 5.7 11/02/2023   HGB 11.8 11/02/2023   PLT 357 11/02/2023   NA 141 11/02/2023   K 3.9 11/02/2023   CL 102 11/02/2023   CO2 29 11/02/2023   GLUCOSE 97 11/02/2023   BUN 11 11/02/2023   CREATININE 0.68 11/02/2023   BILITOT 0.5 11/02/2023   ALKPHOS 75 01/08/2021   AST 26 11/02/2023   ALT 19 11/02/2023   PROT 8.0 11/02/2023   ALBUMIN 4.0 01/08/2021   CALCIUM  9.2 11/02/2023   GFRAA 123 10/11/2020   QFTBGOLDPLUS NEGATIVE 04/21/2023    Speciality Comments: No specialty comments available.  Procedures:  No procedures performed Allergies: Methotrexate  derivatives, Peanut-containing drug products, Acyclovir and related, Aspirin , Egg-derived products, and Tomato   Assessment / Plan:     Visit Diagnoses: Rheumatoid arthritis involving multiple sites, unspecified whether rheumatoid factor present (HCC) - Consider referral to physical therapy for shoulder pain if it persists or worsens -  Plan: predniSONE  (DELTASONE ) 5 MG tablet, Upadacitinib  ER (RINVOQ ) 15 MG TB24, Sedimentation rate Inflammation appears reasonably well-controlled without much peripheral synovitis on exam but she has extensive chronic joint damage.  Does have a significant exacerbation of pain and stiffness complaints after a few months off of Rinvoq .  Otherwise has been tolerating medicine well. - Checking sed rate for disease activity monitoring - Resume Rinvoq  15 mg p.o. daily - Continue prednisone  5 mg daily -Continue tizanidine  4 mg as needed usually at night muscle spasticity and myofascial pain - Referral to physical therapy for right shoulder pain appears more due to osteoarthritis possibly some impingement  High risk medication use - Rinvoq  15 mg daily - Plan: CBC with Differential/Platelet, Comprehensive metabolic panel with GFR Tolerating medications well.  No serious interval infections. - Checking CBC and CMP for medication monitoring on long-term use of Rinvoq  and prednisone   Long term (current) use of systemic steroids - Prednisone  5 mg daily.  Gastroesophageal reflux disease without esophagitis - omeprazole  20 mg   Orders: Orders Placed This Encounter  Procedures   Sedimentation rate   CBC with Differential/Platelet   Comprehensive metabolic panel with GFR   Meds ordered this encounter  Medications   tiZANidine  (ZANAFLEX ) 4 MG tablet    Sig: TAKE 1 TABLET BY MOUTH EVERY 8 HOURS AS NEEDED FOR MUSCLE SPASM    Dispense:  90 tablet    Refill:  0   predniSONE  (DELTASONE ) 5 MG tablet    Sig: Take 1 tablet (5 mg total) by mouth daily with breakfast.    Dispense:  90 tablet    Refill:  0   Upadacitinib  ER (RINVOQ )  15 MG TB24    Sig: Take 1 tablet (15 mg total) by mouth daily.    Dispense:  90 tablet    Refill:  0    Prescription Type::   Renewal     Follow-Up Instructions: Return in about 3 months (around 05/15/2024) for RA on UPA/GC f/u 3mos.   Matt Song, MD  Note -  This record has been created using [employer].  Chart creation errors have been sought, but may not always  have been located. Such creation errors do not reflect on  the standard of medical care.

## 2024-02-01 NOTE — Telephone Encounter (Signed)
 Patient states she had gotten her Rinvoq last month with no problems. Patient states she recently was contacted by Denyse Amass who stated the patient needed to pay a copay of $6,000. Patient states she is currently out of medication and is not sure what to do. Please advise.

## 2024-02-02 ENCOUNTER — Other Ambulatory Visit (HOSPITAL_COMMUNITY): Payer: Self-pay

## 2024-02-02 NOTE — Telephone Encounter (Signed)
 Called [facility] regarding Rinvoq copay. Patient has two insurances.   Rx was billed through insurance on 01/27/2024 which is why it is rejecting for RTS. They will call patient once order is ready for scheduling. They have not billed secondary insurance yet. ATC patient to discuss - unable to reach . Left VM advising that pharmacy will be in touch and she may call us back if the pharmacy quotes same high copay for her  Chesley Mires, PharmD, MPH, BCPS, CPP Clinical Pharmacist (Rheumatology and Pulmonology)

## 2024-02-12 ENCOUNTER — Other Ambulatory Visit: Payer: Self-pay | Admitting: Internal Medicine

## 2024-02-12 ENCOUNTER — Other Ambulatory Visit: Payer: Self-pay | Admitting: Student

## 2024-02-12 DIAGNOSIS — M069 Rheumatoid arthritis, unspecified: Secondary | ICD-10-CM

## 2024-02-12 DIAGNOSIS — K219 Gastro-esophageal reflux disease without esophagitis: Secondary | ICD-10-CM

## 2024-02-14 ENCOUNTER — Encounter: Payer: Self-pay | Admitting: Internal Medicine

## 2024-02-14 ENCOUNTER — Ambulatory Visit: Payer: No Typology Code available for payment source | Attending: Internal Medicine | Admitting: Internal Medicine

## 2024-02-14 VITALS — BP 126/84 | HR 101 | Resp 16 | Ht 64.0 in | Wt 218.2 lb

## 2024-02-14 DIAGNOSIS — K219 Gastro-esophageal reflux disease without esophagitis: Secondary | ICD-10-CM

## 2024-02-14 DIAGNOSIS — Z7952 Long term (current) use of systemic steroids: Secondary | ICD-10-CM | POA: Diagnosis not present

## 2024-02-14 DIAGNOSIS — Z79899 Other long term (current) drug therapy: Secondary | ICD-10-CM

## 2024-02-14 DIAGNOSIS — M069 Rheumatoid arthritis, unspecified: Secondary | ICD-10-CM

## 2024-02-14 DIAGNOSIS — R200 Anesthesia of skin: Secondary | ICD-10-CM

## 2024-02-14 MED ORDER — TIZANIDINE HCL 4 MG PO TABS: ORAL_TABLET | ORAL | 0 refills | Status: DC

## 2024-02-14 MED ORDER — PREDNISONE 5 MG PO TABS: 5.0000 mg | ORAL_TABLET | Freq: Every day | ORAL | 0 refills | Status: DC

## 2024-02-14 MED ORDER — RINVOQ 15 MG PO TB24: 15.0000 mg | ORAL_TABLET | Freq: Every day | ORAL | 0 refills | Status: DC

## 2024-02-15 LAB — COMPREHENSIVE METABOLIC PANEL WITH GFR
AG Ratio: 1.2 (calc) (ref 1.0–2.5)
ALT: 18 U/L (ref 6–29)
AST: 21 U/L (ref 10–35)
Albumin: 4.2 g/dL (ref 3.6–5.1)
Alkaline phosphatase (APISO): 71 U/L (ref 37–153)
BUN/Creatinine Ratio: 10 (calc) (ref 6–22)
BUN: 6 mg/dL — ABNORMAL LOW (ref 7–25)
CO2: 29 mmol/L (ref 20–32)
Calcium: 9 mg/dL (ref 8.6–10.4)
Chloride: 102 mmol/L (ref 98–110)
Creat: 0.61 mg/dL (ref 0.50–1.03)
Globulin: 3.5 g/dL (ref 1.9–3.7)
Glucose, Bld: 109 mg/dL — ABNORMAL HIGH (ref 65–99)
Potassium: 3.5 mmol/L (ref 3.5–5.3)
Sodium: 140 mmol/L (ref 135–146)
Total Bilirubin: 0.5 mg/dL (ref 0.2–1.2)
Total Protein: 7.7 g/dL (ref 6.1–8.1)
eGFR: 104 mL/min/{1.73_m2} (ref >=60)

## 2024-02-15 LAB — CBC WITH DIFFERENTIAL/PLATELET
Absolute Lymphocytes: 1750 {cells}/uL (ref 850–3900)
Absolute Monocytes: 324 {cells}/uL (ref 200–950)
Basophils Absolute: 22 {cells}/uL (ref 0–200)
Basophils Relative: 0.4 %
Eosinophils Absolute: 22 {cells}/uL (ref 15–500)
Eosinophils Relative: 0.4 %
HCT: 33.8 % — ABNORMAL LOW (ref 35.0–45.0)
Hemoglobin: 11.4 g/dL — ABNORMAL LOW (ref 11.7–15.5)
MCH: 31.3 pg (ref 27.0–33.0)
MCHC: 33.7 g/dL (ref 32.0–36.0)
MCV: 92.9 fL (ref 80.0–100.0)
MPV: 9.6 fL (ref 7.5–12.5)
Monocytes Relative: 6 %
Neutro Abs: 3283 {cells}/uL (ref 1500–7800)
Neutrophils Relative %: 60.8 %
Platelets: 317 10*3/uL (ref 140–400)
RBC: 3.64 10*6/uL — ABNORMAL LOW (ref 3.80–5.10)
RDW: 12.9 % (ref 11.0–15.0)
Total Lymphocyte: 32.4 %
WBC: 5.4 10*3/uL (ref 3.8–10.8)

## 2024-02-15 LAB — SEDIMENTATION RATE: Sed Rate: >130 mm/h — ABNORMAL HIGH (ref 0–30)

## 2024-03-04 ENCOUNTER — Other Ambulatory Visit: Payer: Self-pay | Admitting: Internal Medicine

## 2024-03-04 ENCOUNTER — Other Ambulatory Visit: Payer: Self-pay | Admitting: Student

## 2024-03-04 DIAGNOSIS — M069 Rheumatoid arthritis, unspecified: Secondary | ICD-10-CM

## 2024-03-07 NOTE — Telephone Encounter (Signed)
 Patient contacted the office and states she is still getting the $6,000 copay bill. Patient states she has been without her medication for a month. Patient states she is using the [facility] only. Please advise.

## 2024-03-09 NOTE — Telephone Encounter (Signed)
 Spoke with [facility] to discuss expensive co-pay. Representative stated that prior to scheduling shipment they only bill primary insurance. They have patient's secondary insurance on file and will add it once the patient contacts them to authorize shipment.   Called patient to inform her of this. She was given their call back number to authorize shipment of Rinvoq . She was also instructed to ask the representative to add her secondary insurance and give her an updated co-pay over the phone. She will contact the clinic if the co-pay is still expensive.    [facility]  Phone number: [phone]  Fax [phone].    Tolu Lalania Haseman, PharmD [employer] PGY1

## 2024-03-17 ENCOUNTER — Other Ambulatory Visit: Payer: Self-pay | Admitting: Student

## 2024-03-17 DIAGNOSIS — F410 Panic disorder [episodic paroxysmal anxiety] without agoraphobia: Secondary | ICD-10-CM

## 2024-03-24 ENCOUNTER — Other Ambulatory Visit: Payer: Self-pay | Admitting: Internal Medicine

## 2024-03-24 DIAGNOSIS — M069 Rheumatoid arthritis, unspecified: Secondary | ICD-10-CM

## 2024-03-24 NOTE — Telephone Encounter (Signed)
 Last Fill: 02/14/2024  Next Visit: 05/15/2024  Last Visit: 02/14/2024  Dx: Rheumatoid arthritis involving multiple sites, unspecified whether rheumatoid factor present (HCC)   Current Dose per office note on 02/14/2024: tizanidine  4 mg as needed usually at night muscle spasticity and myofascial pain   Okay to refill Tizanidine ?

## 2024-04-19 ENCOUNTER — Ambulatory Visit (HOSPITAL_COMMUNITY)
Admission: EM | Admit: 2024-04-19 | Discharge: 2024-04-19 | Disposition: A | Discharge status: Home / Self Care | Attending: Family Medicine | Admitting: Family Medicine

## 2024-04-19 ENCOUNTER — Encounter (HOSPITAL_COMMUNITY): Payer: Self-pay

## 2024-04-19 DIAGNOSIS — R519 Headache, unspecified: Secondary | ICD-10-CM

## 2024-04-19 DIAGNOSIS — H5712 Ocular pain, left eye: Secondary | ICD-10-CM

## 2024-04-19 MED ORDER — TETRACAINE HCL 0.5 % OP SOLN
OPHTHALMIC | Status: AC
  Filled 2024-04-19: qty 4

## 2024-04-19 MED ORDER — ERYTHROMYCIN 5 MG/GM OP OINT: TOPICAL_OINTMENT | OPHTHALMIC | 0 refills | Status: AC

## 2024-04-19 NOTE — ED Triage Notes (Addendum)
 Patient reports a headache with left eye pressure since yesterday.  Patient states she has taken ES Tylenol  and the last dose was at 0800 today.

## 2024-04-19 NOTE — Discharge Instructions (Addendum)
 Visual Acuity  Right Eye Distance: 20/70 Left Eye Distance: 20/40 Bilateral Distance: 20/50  Right Eye Near:   Left Eye Near:    Bilateral Near:     As I mentioned, the tool to measure your eye pressure is not working today but the ophthalmologist will be able to provide a thorough eye exam.

## 2024-04-19 NOTE — ED Notes (Signed)
 Escorted patient to the car by wheelchair.    Patient was discharged by this nurse

## 2024-04-21 ENCOUNTER — Other Ambulatory Visit: Payer: Self-pay | Admitting: Internal Medicine

## 2024-04-21 DIAGNOSIS — M069 Rheumatoid arthritis, unspecified: Secondary | ICD-10-CM

## 2024-04-21 NOTE — Telephone Encounter (Signed)
 Last Fill: 03/24/2024   Next Visit: 05/15/2024   Last Visit: 02/14/2024   Dx: Rheumatoid arthritis involving multiple sites, unspecified whether rheumatoid factor present (HCC)    Current Dose per office note on 02/14/2024: tizanidine  4 mg as needed usually at night muscle spasticity and myofascial pain    Okay to refill Tizanidine ?

## 2024-04-22 NOTE — ED Provider Notes (Signed)
 [facility]   [id number] 04/19/24 Arrival Time: 1031  ASSESSMENT & PLAN:  1. Acute nonintractable headache, unspecified headache type   2. Acute left eye pain    Tonopen and wood's lamp not working today. Will have her see ophthalmology in the morning; spoke with triage nurse at Dr Jamal office. Meds ordered this encounter  Medications   erythromycin  ophthalmic ointment    Sig: Place a 1/2 inch ribbon of ointment into the left lower eyelid 2-3x per day.    Dispense:  3.5 g    Refill:  0   Normal neurological exam. Recommend:  Follow-up Information     Maree Lonni Inks, MD.   Specialty: Ophthalmology Why: Dr Maree will see you at 8:00 am on 04/20/2024. Contact information: [address] [place] [address] [phone]         [facility] Emergency Department at [facility].   Specialty: Emergency Medicine Why: If symptoms worsen in any way. Contact information: [address] [place]  [phone]                 Reviewed expectations re: course of current medical issues. Questions answered. Outlined signs and symptoms indicating need for more acute intervention. Patient verbalized understanding. After Visit Summary given.   SUBJECTIVE: History from: Patient Patient is able to give a clear and coherent history.  Kathy [name] is a 58 y.o. female who presents with complaint of a HA and pain of L eye; few days; has eased a little today. Denies specific vision changes. Does not wear contact lenses.  Uncorrected:   Visual Acuity  Right Eye Distance: 20/70 Left Eye Distance: 20/40 Bilateral Distance: 20/50  Right Eye Near:   Left Eye Near:    Bilateral Near:     Denies dizziness, loss of balance, muscle weakness, numbness of extremities, and speech difficulties. No head injury reported. Ambulatory without difficulty. No recent travel.   OBJECTIVE:  Vitals:   04/19/24 1046   BP: 109/75  Pulse: 98  Resp: 16  Temp: 98.4 F (36.9 C)  TempSrc: Oral  SpO2: 92%   HENT: normocephalic; atraumatic Eyes: PERRLA; EOMI; conjunctivae normal; L eye with slight limbal flush; able to keep eye open Neck: supple with FROM Lungs: clear to auscultation bilaterally; unlabored respirations Heart: regular rate and rhythm Extremities: no edema; symmetrical with no gross deformities Skin: warm and dry Neurologic: alert; speech is fluent and clear without dysarthria or aphasia; CN 2-12 grossly intact; no facial droop; normal gait; normal symmetric reflexes; normal extremity strength and sensation throughout; bilateral upper and lower extremity sensation is grossly intact with 5/5 symmetric strength; normal grip strength Psychological: alert and cooperative; normal mood and affect  Labs:  Labs Reviewed - No data to display  Imaging: No results found.  Allergies  Allergen Reactions   Methotrexate  Derivatives Shortness Of Breath and Nausea And Vomiting   Peanut-Containing Drug Products Anaphylaxis   Acyclovir And Related    Aspirin  Nausea And Vomiting   Egg-Derived Products Nausea And Vomiting   Tomato Rash    Past Medical History:  Diagnosis Date   Asthma    seasonal   GERD (gastroesophageal reflux disease)    Rheumatoid arteritis (HCC)    Social History   Socioeconomic History   Marital status: Single    Spouse name: Not on file   Number of children: Not on file   Years of education: Not on file   Highest education level: Not on file  Occupational History   Not on file  Tobacco Use   Smoking status: Never    Passive exposure: Never   Smokeless tobacco: Never  Vaping Use   Vaping status: Never Used  Substance and Sexual Activity   Alcohol use: No   Drug use: No   Sexual activity: Not on file  Other Topics Concern   Not on file  Social History Narrative   Not on file   Social Drivers of Health   Financial Resource Strain: Not on file  Food  Insecurity: Not on file  Transportation Needs: Not on file  Physical Activity: Not on file  Stress: Not on file  Social Connections: Not on file  Intimate Partner Violence: Not on file   Family History  Problem Relation Age of Onset   Asthma Mother    Diabetes Mother    Hypertension Mother    Heart disease Mother    Heart disease Father    Asthma Father    Cancer Father        lung   Lung cancer Sister    Cancer Sister        breast at 75   Lupus Sister    Cancer Brother        unknown   Cancer Nephew    Diabetes Nephew    Healthy Son    Past Surgical History:  Procedure Laterality Date   ABDOMINAL HYSTERECTOMY     TOTAL KNEE ARTHROPLASTY Right 06/18/2015   Procedure: RIGHT TOTAL KNEE ARTHROPLASTY;  Surgeon: Lonni CINDERELLA Poli, MD;  Location: [facility] OR;  Service: Orthopedics;  Laterality: Right;   TOTAL KNEE ARTHROPLASTY Left 05/11/2017   Procedure: LEFT TOTAL KNEE ARTHROPLASTY;  Surgeon: Poli Lonni CINDERELLA, MD;  Location: [facility] OR;  Service: Orthopedics;  Laterality: Left;      Rolinda Rogue, MD 04/22/24 1036

## 2024-04-25 ENCOUNTER — Ambulatory Visit: Admitting: Student

## 2024-04-25 NOTE — Progress Notes (Deleted)
  SUBJECTIVE:   CHIEF COMPLAINT / HPI:   Headaches  PERTINENT  PMH / PSH: ***  OBJECTIVE:  There were no vitals taken for this visit. ***  ASSESSMENT/PLAN:   Assessment & Plan  No follow-ups on file. Penne Rhein, MD 04/25/2024, 1:26 PM PGY-***, [facility] {    This will disappear when note is signed, click to select method of visit    :1}

## 2024-04-25 NOTE — Patient Instructions (Incomplete)
 It was great to see you! Thank you for allowing me to participate in your care!  I recommend that you always bring your medications to each appointment as this makes it easy to ensure we are on the correct medications and helps [place] not miss when refills are needed.  Our plans for today:  - Headaches -   We are checking some labs today, I will call you if they are abnormal will send you a MyChart message or a letter if they are normal.  If you do not hear about your labs in the next 2 weeks please let us know.***  Take care and seek immediate care sooner if you develop any concerns.   Dr. Bess Kinds, MD [facility]

## 2024-05-01 ENCOUNTER — Ambulatory Visit: Admitting: Student

## 2024-05-01 ENCOUNTER — Encounter: Payer: Self-pay | Admitting: Student

## 2024-05-01 VITALS — BP 136/82 | HR 90 | Ht 64.0 in | Wt 216.0 lb

## 2024-05-01 DIAGNOSIS — G43709 Chronic migraine without aura, not intractable, without status migrainosus: Secondary | ICD-10-CM

## 2024-05-01 DIAGNOSIS — G43909 Migraine, unspecified, not intractable, without status migrainosus: Secondary | ICD-10-CM | POA: Insufficient documentation

## 2024-05-01 MED ORDER — TOPIRAMATE 25 MG PO TABS: 50.0000 mg | ORAL_TABLET | Freq: Two times a day (BID) | ORAL | 1 refills | Status: DC

## 2024-05-01 NOTE — Assessment & Plan Note (Addendum)
 Patient comes in for concern of headaches that been an issue for the last 2 to 4 weeks.  Patient appreciates headaches every other day, worse at night/only occurring at night.  Patient rates pain 6 out of 10, located behind her eyes, that responds well to Tylenol  650 mg x 1.  Patient reports this feels like her normal migraines, with photosensitivity, no aura.  Will start Topamax  prevent migraines, with close follow-up. Patient w/o HA today and normal neuro exam.  Patient also continuing eyedrops, that have helped with headaches/eye pain as well.  Patient did not follow-up with ophthalmology as they did not take her insurance, but reports eye is feeling better. - Start Topamax  - Tylenol  for abortive

## 2024-05-01 NOTE — Progress Notes (Signed)
 Office Visit Note  Patient: Kathy [name]             Date of Birth: 1966-05-08           MRN: [id number]             PCP: Jennelle Riis, MD Referring: Jennelle Riis, MD Visit Date: 05/15/2024   Subjective:  Follow-up (Patient states she needs a refill of Voltaren  and Prednisone . )     Discussed the use of AI scribe software for clinical note transcription with the patient, who gave verbal consent to proceed.  History of Present Illness   Kathy [name] is a 58 y.o. female here for follow up for seropositive RA was on Rinvoq  15 mg daily and prednisone  5 mg daily.    She is currently out of prednisone  and voltaren  gel, having last taken prednisone  on Saturday. She continues to take Rinvoq  uninterrupted. No major flare-ups of her arthritis have occurred, and she has been doing fine. She has some increase in right ankle swelling and stiffness.  She experiences morning stiffness until about 11:00 AM, waking up around 9:00 AM. Some swelling is present in her feet, which decreases when she is off her feet.  She describes tingling in her left hand, particularly during bad weather, affecting the palm area but not the fingers. No shooting pain or numbness in the fingers is reported.  She mentions a previous visit for a headache and eye issue, which was not diagnosed as pink eye. The headache was treated and resolved, and she suspects it was viral conjunctivitis.  Previous HPI 02/14/2024 Kathy [name] is a 58 y.o. female here for follow up for seropositive RA was on Rinvoq  15 mg daily and prednisone  5 mg daily.  She has been off Rinvoq  for about 3 weeks due to needing a prescription refill and needing to follow-up for medication monitoring appointment.  She has still been on her prednisone  5 mg once daily took the last dose today.  Since being off the Rinvoq  does have increased joint pain and stiffness in a couple areas worst at the right shoulder and in her legs and knees.  The  right shoulder pain has been persistent even while on her treatment and she rates the severity as 8 out of 10 on many days and usually gets worse at nighttime.   Previous HPI 11/02/2023 Kathy [name] is a 58 y.o. female here for follow up for seropositive RA was on Rinvoq  15 mg daily and prednisone  5 mg daily. She reports symptoms are doing fair. They report intermittent adherence to Rinvoq  in the past, which led to flares of their arthritis. Recently, they have been taking Rinvoq  consistently and report no issues with the medication.   The patient describes their joint health as variable, with good and bad days. The most significant discomfort is reported in the shoulders. They deny any recent swelling in the hands. They also report a recent minor cold.   The patient also experiences intermittent numbness in their hands, which they describe as a cramping sensation affecting the entire hand. This numbness is sporadic and can wake them up at night. They also report some tenderness in the right shoulder and occasional soreness in the breast.       07/22/2023 Kathy [name] is a 58 y.o. female here for follow up for seropositive RA was on Rinvoq  15 mg daily and prednisone  5 mg daily but ran out of her medications due to delay  in her clinic follow-up.  She is off the prednisone  since about 4 days ago has noticed increased pain in her left hand but it is inconsistent worse in the mornings and coming and going during the day.  Before that feels symptoms are doing pretty well still had intermittent numbness and cramping again worse in the left hand.  Did not have very prolonged morning stiffness while she had both her medications.   04/21/2023 Kathy [name] is a 58 y.o. female here for follow up for seropositive RA was on Rinvoq  15 mg daily and prednisone  5 mg daily but ran out of her medications due to delay in her clinic follow-up.  Originally was scheduled to be seen in March.  So she has been  off her medicine in the past month.  Symptoms were doing well while on this regimen.  But now having a flare up with increased joint pain and swelling for the past week and a half. Worst in her shoulders and hands also swelling in right ankle. She also reports new complaint about left eye today with some pressure and new gray and purple spots in her vision.   10/14/22 Kathy [name] is a 58 y.o. female here for follow up for seropositive RA on rinvoq  15 mg daily and prednisone  5 mg daily. She feels symptoms are pretty stable does still have considerable pain often in her feet and and shoulders most frequently. She was in [place]  for 3 months over the interval so off her rinvoq  for a while resumed this before thanksgiving. She also feels some increased aches with the colder weather change. Taking the tizanidine  4 mg with her other medications once daily as well.   03/12/22 Kathy [name] is a 58 y.o. female here for follow up for seropositive RA on rinvoq  15 mg PO daily and prednisone  5 mg daily.  She has increased pain and stiffness in her left shoulder she notices this more with weather changes and sometimes takes prednisone  up to twice daily for this. She is off rinvoq  again about 2 weeks due to lack of refills.   12/08/2021 Kathy [name] is a 58 y.o. female here for follow up for seropositive RA on rinvoq  15 mg PO daily and prednisone  trying to taper off from 5 mg daily. She has felt improvement with resuming rinvoq  symptoms are mostly good. She is off the medicine again since about 1 week ago from running out. She takes the 5 mg prednisone  one in the morning on days where she has noticeably increased pain most often with cold and weather changes. She has shoulder pain at night also bothering her but not severely limiting any activities.    09/02/21 Kathy [name] is a 58 y.o. female here for follow up for seropositive RA on Rinvoq  15 mg p.o. daily. Recommended trying to taper off the  remaining low-dose prednisone  if symptoms were well controlled since the last visit. She missed her last appointment due to bronchitis and so is off the rinvoq  for at least 2 months. This did not cause a severe flare but she definitely feels a difference and increased prednisone  use, She has been taking prednisone  5 mg daily whenever she notices more joint pain than usual often associated with weather changes. She takes 10 mg rarely sometimes in raining weather.    05/09/21 Kathy [name] is a 58 y.o. female here for follow up for seropositive rheumatoid arthritis on Rinvoq  15 mg daily Plaquenil  200 mg daily and  prednisone  5 mg daily.  After her last visit she restarted the Rinvoq  due to lapse in treatment for a period of time.  She feels her symptoms are currently well controlled on this regimen and has no complaints.   10/11/20 Kathy [name] is a 57 y.o. female here to establish care of rheumatoid arthritis for which she previous saw Dr. Ziolkowski in [facility] but is looking for an office closer to her residence due to difficulty with transportation. She has a history of highly active RA for years with pain that is worst in her shoulders, hands, knees, and feet with deforming arthritis of hands and feet and has had bilateral knee replacements for arthritis. Previous rheumatologists Dr. Levitin and Dr. Ishmael. Her most recent visit with [place] office was in October with disease in exacerbation at that time and she took a prednisone  taper but has remained active taking 15mg  of prednisone  currently 10mg  AM and 5mg  PM.    Review of Systems  Constitutional:  Negative for fatigue.  HENT:  Negative for mouth sores and mouth dryness.   Eyes:  Negative for dryness.  Respiratory:  Negative for shortness of breath.   Cardiovascular:  Negative for chest pain and palpitations.  Gastrointestinal:  Negative for blood in stool, constipation and diarrhea.  Endocrine: Negative for increased urination.   Genitourinary:  Negative for involuntary urination.  Musculoskeletal:  Positive for joint pain, joint pain, joint swelling, myalgias, morning stiffness and myalgias. Negative for gait problem, muscle weakness and muscle tenderness.  Skin:  Negative for color change, rash, hair loss and sensitivity to sunlight.  Allergic/Immunologic: Negative for susceptible to infections.  Neurological:  Negative for dizziness and headaches.  Hematological:  Negative for swollen glands.  Psychiatric/Behavioral:  Negative for depressed mood and sleep disturbance. The patient is not nervous/anxious.     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   Anemia 09/18/2014   Cough 02/04/2010   Rheumatoid arthritis (HCC) 09/15/2009   Generalized osteoarthritis of multiple sites 07/28/2007    Past Medical History:  Diagnosis Date   Asthma    seasonal   GERD (gastroesophageal reflux disease)    Rheumatoid arteritis (HCC)     Family History  Problem Relation Age of Onset   Asthma Mother    Diabetes Mother    Hypertension Mother    Heart disease Mother    Heart disease Father    Asthma Father    Cancer Father        lung   Lung cancer Sister    Cancer Sister        breast at 44   Lupus Sister    Cancer Brother        unknown   Cancer Nephew    Diabetes [employer] Son    Past Surgical History:  Procedure Laterality Date   ABDOMINAL  HYSTERECTOMY     TOTAL KNEE ARTHROPLASTY Right 06/18/2015   Procedure: RIGHT TOTAL  KNEE ARTHROPLASTY;  Surgeon: Lonni CINDERELLA Poli, MD;  Location: [facility] OR;  Service: Orthopedics;  Laterality: Right;   TOTAL KNEE ARTHROPLASTY Left 05/11/2017   Procedure: LEFT TOTAL KNEE ARTHROPLASTY;  Surgeon: Poli Lonni CINDERELLA, MD;  Location: [facility] OR;  Service: Orthopedics;  Laterality: Left;   Social History   Social History Narrative   Not on file   Immunization History  Administered Date(s) Administered   Influenza,inj,Quad PF,6+ Mos 09/19/2014, 07/23/2015, 08/04/2018, 07/08/2020, 10/30/2021   Influenza,inj,Quad PF,6-35 Mos 11/16/2017   PFIZER Comirnaty(Gray Top)Covid-19 Tri-Sucrose Vaccine 11/22/2020   PFIZER(Purple Top)SARS-COV-2 Vaccination 02/29/2020, 03/26/2020   Pneumococcal Polysaccharide-23 10/26/2002   Tdap 07/03/2016, 08/04/2018     Objective: Vital Signs: BP 106/71 (BP Location: Left Arm, Patient Position: Sitting, Cuff Size: Large)   Pulse 84   Resp 14   Ht 5' 5 (1.651 m)   Wt 217 lb (98.4 kg)   BMI 36.11 kg/m    Physical Exam Constitutional:      Appearance: She is obese.  Eyes:     Conjunctiva/sclera: Conjunctivae normal.  Cardiovascular:     Rate and Rhythm: Normal rate and regular rhythm.  Pulmonary:     Effort: Pulmonary effort is normal.     Breath sounds: Normal breath sounds.  Musculoskeletal:     Right lower leg: No edema.     Left lower leg: No edema.  Lymphadenopathy:     Cervical: No cervical adenopathy.  Skin:    General: Skin is warm and dry.  Neurological:     Mental Status: She is alert.  Psychiatric:        Mood and Affect: Mood normal.      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  Imaging: No results found.  Recent Labs: Lab Results  Component Value Date   WBC 5.4 02/14/2024   HGB 11.4 (L) 02/14/2024   PLT 317 02/14/2024   NA 140 02/14/2024   K 3.5 02/14/2024   CL 102 02/14/2024   CO2 29 02/14/2024   GLUCOSE 109 (H) 02/14/2024   BUN 6 (L) 02/14/2024   CREATININE 0.61 02/14/2024   BILITOT 0.5 02/14/2024   ALKPHOS 75 01/08/2021   AST 21 02/14/2024   ALT 18 02/14/2024   PROT 7.7 02/14/2024   ALBUMIN 4.0 01/08/2021   CALCIUM  9.0 02/14/2024   GFRAA 123 10/11/2020   QFTBGOLDPLUS NEGATIVE 04/21/2023    Speciality Comments: No specialty comments available.  Procedures:  No procedures performed Allergies: Methotrexate  derivatives, Peanut-containing drug products, Acyclovir and related, Aspirin , Egg-derived products, and Tomato   Assessment / Plan:     Visit Diagnoses: Rheumatoid arthritis involving multiple sites, unspecified whether rheumatoid factor present (HCC) - Plan: predniSONE  (DELTASONE ) 5 MG tablet, Upadacitinib  ER (RINVOQ ) 15 MG TB24, diclofenac  Sodium (VOLTAREN ) 1 % GEL, C-reactive protein Inflammation appears mostly under control with some chronic changes and soft tissue swelling.  Right ankle more painful  than usual.  She describes some change in the left third toe I do not appreciate any current inflammation but appears to now be an 8 swan-neck deformity.  Describes considerable morning stiffness but not limiting her functionally. - Checking CRP for disease activity monitoring - Plan to recheck hand and foot x-rays at next  follow-up for assessment of radiographic progression - Continue Rinvoq  15 mg p.o. daily - Continue prednisone  5 mg daily -Sending prescription refill for Voltaren  last prescription was through family medicine provider but agree with treatment plan - Continue tizanidine  4 mg as needed usually at night muscle spasticity and myofascial pain  High risk medication use - Rinvoq  15 mg p.o. daily - Plan: CBC with Differential/Platelet, Comprehensive metabolic panel with GFR, QuantiFERON-TB Gold Plus Tolerating medications okay with no adverse effect reported.  No serious interval infection. - Checking CBC CMP and QuantiFERON for medication monitoring on continued long-term use of Rinvoq   Long term (current) use of systemic steroids - prednisone  5 mg daily  Gastroesophageal reflux disease without esophagitis - omeprazole  20 mg  Tingling in left hand Intermittent tingling suggests nerve-related issue without significant discomfort or functional impairment.  Right ankle pain Tightness in right calf muscle Tightness in lateral gastrocnemius affects gait, causing difficulty in relaxing the foot. - Provided stretching exercises for the right calf muscle.        Orders: Orders Placed This Encounter  Procedures   C-reactive protein   CBC with Differential/Platelet   Comprehensive metabolic panel with GFR   QuantiFERON-TB Gold Plus   Meds ordered this encounter  Medications   predniSONE  (DELTASONE ) 5 MG tablet    Sig: Take 1 tablet (5 mg total) by mouth daily with breakfast.    Dispense:  90 tablet    Refill:  0   Upadacitinib  ER (RINVOQ ) 15 MG TB24    Sig: Take 1 tablet (15 mg total) by mouth daily.    Dispense:  90 tablet    Refill:  0    Prescription Type::   Renewal   diclofenac  Sodium (VOLTAREN ) 1 % GEL    Sig: Apply 2g 4 times daily to affected area as needed for pain.    Dispense:  500 g    Refill:  2     Follow-Up Instructions: Return in about 3 months (around 08/15/2024)  for RA on UPA/GC f/u 3mos.   Lonni LELON Ester, MD  Note - This record has been created using [employer].  Chart creation errors have been sought, but may not always  have been located. Such creation errors do not reflect on  the standard of medical care.

## 2024-05-01 NOTE — Progress Notes (Signed)
  SUBJECTIVE:   CHIEF COMPLAINT / HPI:   Headaches They have been experiencing headaches and discomfort in their left eye. Initially, they sought care at an urgent care facility where erythromycin  eye ointment was prescribed, leading to improvement in their eye symptoms. They currently deny any left eye pain.  The headaches have been occurring intermittently for the past three to four weeks, primarily at night, every other night. The pain is located around the eyes and is associated with light sensitivity. Each episode lasts approximately 20 minutes and is relieved by Tylenol . No associated dizziness, balance changes, lightheadedness, fevers, body aches, or chills. They have a history of migraines and note that the current headaches feel similar to their migraines.  They report drinking a lot of water, typically eating two meals a day, and state that their sleep is not affected by the headaches.  PERTINENT  PMH / PSH:   OBJECTIVE:  BP 136/82   Pulse 90   Ht 5' 4 (1.626 m)   Wt 216 lb (98 kg)   SpO2 100%   BMI 37.08 kg/m  Physical Exam Neurological:     Mental Status: She is alert.     Cranial Nerves: Cranial nerves 2-12 are intact. No cranial nerve deficit, dysarthria or facial asymmetry.     Sensory: Sensation is intact. No sensory deficit.     Motor: Motor function is intact. No weakness or tremor.     Coordination: Coordination normal. Finger-Nose-Finger Test and Heel to Hayden Test normal.     Gait: Gait is intact.      ASSESSMENT/PLAN:   Assessment & Plan Chronic migraine without aura without status migrainosus, not intractable Patient comes in for concern of headaches that been an issue for the last 2 to 4 weeks.  Patient appreciates headaches every other day, worse at night/only occurring at night.  Patient rates pain 6 out of 10, located behind her eyes, that responds well to Tylenol  650 mg x 1.  Patient reports this feels like her normal migraines, with photosensitivity,  no aura.  Will start Topamax  prevent migraines, with close follow-up. Patient w/o HA today and normal neuro exam.  Patient also continuing eyedrops, that have helped with headaches/eye pain as well.  Patient did not follow-up with ophthalmology as they did not take her insurance, but reports eye is feeling better. - Start Topamax  - Tylenol  for abortive No follow-ups on file. Penne Rhein, MD 05/01/2024, 11:19 AM PGY-3, [facility]

## 2024-05-01 NOTE — Patient Instructions (Addendum)
 It was great to see you! Thank you for allowing me to participate in your care!  I recommend that you always bring your medications to each appointment as this makes it easy to ensure we are on the correct medications and helps us  not miss when refills are needed.  Our plans for today:  - Headaches It sounds like you are having migraines. We will start a medication to help prevent them from coming back.   Take Topomax daily   Start 25 mg daily x 1 week   Then 50 mg daily x 1 week   Then 50 mg twice a day  - Health Maintenance  You are due for a    Colonoscopy   Mammogram   Shingles Vaccine (availble in pharmacies)   Hep B Vaccine   Covid 19  We are checking some labs today, I will call you if they are abnormal will send you a MyChart message or a letter if they are normal.  If you do not hear about your labs in the next 2 weeks please let us  know.  Take care and seek immediate care sooner if you develop any concerns.   Dr. Penne Rhein, MD [facility]

## 2024-05-15 ENCOUNTER — Encounter: Payer: Self-pay | Admitting: Internal Medicine

## 2024-05-15 ENCOUNTER — Ambulatory Visit: Attending: Internal Medicine | Admitting: Internal Medicine

## 2024-05-15 VITALS — BP 106/71 | HR 84 | Resp 14 | Ht 65.0 in | Wt 217.0 lb

## 2024-05-15 DIAGNOSIS — Z79899 Other long term (current) drug therapy: Secondary | ICD-10-CM | POA: Insufficient documentation

## 2024-05-15 DIAGNOSIS — M069 Rheumatoid arthritis, unspecified: Secondary | ICD-10-CM | POA: Insufficient documentation

## 2024-05-15 DIAGNOSIS — K219 Gastro-esophageal reflux disease without esophagitis: Secondary | ICD-10-CM | POA: Insufficient documentation

## 2024-05-15 DIAGNOSIS — Z7952 Long term (current) use of systemic steroids: Secondary | ICD-10-CM | POA: Insufficient documentation

## 2024-05-15 MED ORDER — DICLOFENAC SODIUM 1 % EX GEL: CUTANEOUS | 2 refills | Status: AC

## 2024-05-15 MED ORDER — PREDNISONE 5 MG PO TABS: 5.0000 mg | ORAL_TABLET | Freq: Every day | ORAL | 0 refills | Status: DC

## 2024-05-15 MED ORDER — RINVOQ 15 MG PO TB24: 15.0000 mg | ORAL_TABLET | Freq: Every day | ORAL | 0 refills | Status: DC

## 2024-05-15 NOTE — Patient Instructions (Signed)
 Ankle Exercises Ask your health care provider which exercises are safe for you. Do exercises exactly as told by your health care provider and adjust them as directed. It is normal to feel mild stretching, pulling, tightness, or discomfort as you do these exercises. Stop right away if you feel sudden pain or your pain gets worse. Do not begin these exercises until told by your health care provider. Stretching and range-of-motion exercises These exercises warm up your muscles and joints. They can help improve the movement and flexibility of your ankle. They may also help to relieve pain. Dorsiflexion/plantar flexion  Sit with your left / right knee straight or bent. Do not rest your foot on anything. Flex your left / right ankle to tilt the top of your foot toward your shin. This is called dorsiflexion. Hold this position for __________ seconds. Point your toes downward to tilt the top of your foot away from your shin. This is called plantar flexion. Hold this position for __________ seconds. Repeat __________ times. Complete this exercise __________ times a day. Ankle alphabet  Sit with your left / right foot supported at your lower leg. Do not rest your foot on anything. Make sure your foot has room to move freely. Think of your left / right foot as a paintbrush: Move your foot to trace each letter of the alphabet in the air. Keep your hip and knee still while you trace the letters. Make the letters as large as you can without causing or increasing any discomfort. Repeat __________ times. Complete this exercise __________ times a day. Passive ankle dorsiflexion This is an exercise in which something or someone moves your ankle for you. Sit in a chair on a non-carpeted surface. Place your left / right foot on the floor, directly under your left / right knee. Extend your left / right leg for support. Keeping your heel down, slide your left / right foot back toward the chair until you feel a  stretch at your ankle or calf. If you do not feel a stretch, slide your buttocks forward to the edge of the chair while keeping your heel down. Hold this stretch for __________ seconds. Repeat __________ times. Complete this exercise __________ times a day. Strengthening exercises These exercises build strength and endurance in your ankle. Endurance is the ability to use your muscles for a long time, even after they get tired. Towel curls  Sit in a chair on a non-carpeted surface. Put your feet on the floor. Place a towel in front of your feet. If told by your health care provider, add a __________ lb / kg weight to the end of the towel. Keeping your heel on the floor, put your left / right foot on the towel. Pull the towel toward you by grabbing the towel with your toes and curling them under. Keep your heel on the floor. Let your toes relax. Grab the towel again. Keep pulling the towel until it is completely underneath your foot. Repeat __________ times. Complete this exercise __________ times a day.

## 2024-05-17 LAB — COMPREHENSIVE METABOLIC PANEL WITH GFR
AG Ratio: 1.3 (calc) (ref 1.0–2.5)
ALT: 18 U/L (ref 6–29)
AST: 22 U/L (ref 10–35)
Albumin: 4.2 g/dL (ref 3.6–5.1)
Alkaline phosphatase (APISO): 58 U/L (ref 37–153)
BUN: 12 mg/dL (ref 7–25)
CO2: 28 mmol/L (ref 20–32)
Calcium: 9.1 mg/dL (ref 8.6–10.4)
Chloride: 107 mmol/L (ref 98–110)
Creat: 0.66 mg/dL (ref 0.50–1.03)
Globulin: 3.3 g/dL (ref 1.9–3.7)
Glucose, Bld: 81 mg/dL (ref 65–99)
Potassium: 4 mmol/L (ref 3.5–5.3)
Sodium: 141 mmol/L (ref 135–146)
Total Bilirubin: 0.5 mg/dL (ref 0.2–1.2)
Total Protein: 7.5 g/dL (ref 6.1–8.1)
eGFR: 102 mL/min/1.73m2 (ref >=60)

## 2024-05-17 LAB — CBC WITH DIFFERENTIAL/PLATELET
Absolute Lymphocytes: 1568 {cells}/uL (ref 850–3900)
Absolute Monocytes: 221 {cells}/uL (ref 200–950)
Basophils Absolute: 11 {cells}/uL (ref 0–200)
Basophils Relative: 0.3 %
Eosinophils Absolute: 49 {cells}/uL (ref 15–500)
Eosinophils Relative: 1.4 %
HCT: 33.4 % — ABNORMAL LOW (ref 35.0–45.0)
Hemoglobin: 10.9 g/dL — ABNORMAL LOW (ref 11.7–15.5)
MCH: 31.6 pg (ref 27.0–33.0)
MCHC: 32.6 g/dL (ref 32.0–36.0)
MCV: 96.8 fL (ref 80.0–100.0)
MPV: 9.6 fL (ref 7.5–12.5)
Monocytes Relative: 6.3 %
Neutro Abs: 1652 {cells}/uL (ref 1500–7800)
Neutrophils Relative %: 47.2 %
Platelets: 342 Thousand/uL (ref 140–400)
RBC: 3.45 Million/uL — ABNORMAL LOW (ref 3.80–5.10)
RDW: 14.4 % (ref 11.0–15.0)
Total Lymphocyte: 44.8 %
WBC: 3.5 Thousand/uL — ABNORMAL LOW (ref 3.8–10.8)

## 2024-05-17 LAB — QUANTIFERON-TB GOLD PLUS
Mitogen-NIL: >10 [IU]/mL
NIL: 0.05 [IU]/mL
QuantiFERON-TB Gold Plus: NEGATIVE
TB1-NIL: 0 [IU]/mL
TB2-NIL: 0 [IU]/mL

## 2024-05-17 LAB — C-REACTIVE PROTEIN: CRP: 5.7 mg/L (ref <8.0)

## 2024-05-25 ENCOUNTER — Other Ambulatory Visit: Payer: Self-pay | Admitting: Internal Medicine

## 2024-05-25 ENCOUNTER — Other Ambulatory Visit: Payer: Self-pay | Admitting: Student

## 2024-05-25 DIAGNOSIS — M069 Rheumatoid arthritis, unspecified: Secondary | ICD-10-CM

## 2024-05-25 DIAGNOSIS — K219 Gastro-esophageal reflux disease without esophagitis: Secondary | ICD-10-CM

## 2024-05-26 NOTE — Telephone Encounter (Signed)
 Last Fill: 04/22/2024  Next Visit: 08/22/2024  Last Visit: 05/15/2024  Dx: Rheumatoid arthritis involving multiple sites, unspecified whether rheumatoid factor present (HCC)   Current Dose per office note on 05/15/2024: tizanidine  4 mg as needed   Okay to refill Tizanidine ?

## 2024-05-29 NOTE — Telephone Encounter (Signed)
 Chart reviewed. Rx refilled.

## 2024-06-09 ENCOUNTER — Other Ambulatory Visit: Payer: Self-pay

## 2024-06-09 MED ORDER — TOPIRAMATE 25 MG PO TABS: 50.0000 mg | ORAL_TABLET | Freq: Two times a day (BID) | ORAL | 1 refills | Status: DC

## 2024-06-09 NOTE — Telephone Encounter (Signed)
 Chart reviewed. Rx refilled. Requesting patient to be seen in clinic for migraine fu.

## 2024-06-28 ENCOUNTER — Ambulatory Visit (INDEPENDENT_AMBULATORY_CARE_PROVIDER_SITE_OTHER): Admitting: Family Medicine

## 2024-06-28 ENCOUNTER — Other Ambulatory Visit: Payer: Self-pay | Admitting: Family Medicine

## 2024-06-28 DIAGNOSIS — J4520 Mild intermittent asthma, uncomplicated: Secondary | ICD-10-CM

## 2024-06-28 DIAGNOSIS — E782 Mixed hyperlipidemia: Secondary | ICD-10-CM

## 2024-06-28 DIAGNOSIS — K219 Gastro-esophageal reflux disease without esophagitis: Secondary | ICD-10-CM

## 2024-06-28 DIAGNOSIS — R519 Headache, unspecified: Secondary | ICD-10-CM | POA: Diagnosis not present

## 2024-06-28 DIAGNOSIS — G8929 Other chronic pain: Secondary | ICD-10-CM

## 2024-06-28 DIAGNOSIS — Z23 Encounter for immunization: Secondary | ICD-10-CM

## 2024-06-28 DIAGNOSIS — H9312 Tinnitus, left ear: Secondary | ICD-10-CM

## 2024-06-28 MED ORDER — OMEPRAZOLE 20 MG PO CPDR: 20.0000 mg | DELAYED_RELEASE_CAPSULE | Freq: Every day | ORAL | 3 refills | Status: AC

## 2024-06-28 MED ORDER — ATORVASTATIN CALCIUM 40 MG PO TABS: 40.0000 mg | ORAL_TABLET | Freq: Every day | ORAL | 3 refills | Status: AC

## 2024-06-28 MED ORDER — TOPIRAMATE 25 MG PO TABS: 50.0000 mg | ORAL_TABLET | Freq: Two times a day (BID) | ORAL | 1 refills | Status: DC

## 2024-06-28 MED ORDER — ALBUTEROL SULFATE HFA 108 (90 BASE) MCG/ACT IN AERS: INHALATION_SPRAY | RESPIRATORY_TRACT | 2 refills | Status: DC

## 2024-06-28 MED ORDER — FLUTICASONE-SALMETEROL 250-50 MCG/ACT IN AEPB: 1.0000 | INHALATION_SPRAY | Freq: Two times a day (BID) | RESPIRATORY_TRACT | 12 refills | Status: AC

## 2024-06-28 NOTE — Patient Instructions (Signed)
 Thank you for visiting clinic today and allowing us  to participate in your care!  For your headaches, please try weaning off of tylenol . For the next 3 days, please use once a day. Then stop tylenol .   Please continue taking Topamax  as prescribed.   We placed a referral to Audiology to assess your hearing further. Someone will contact you over the next few weeks to schedule an appointment.   Please schedule an appointment with us  in 1 month for follow up.   Reach out any time with any questions or concerns you may have - we are here for you!  Damien Cassis, MD [facility] [phone]

## 2024-06-28 NOTE — Progress Notes (Signed)
    SUBJECTIVE:   CHIEF COMPLAINT / HPI:   Migraines - Reports ongoing migraines every other day  - Describes global HA for about 20 minutes, a couple HA per day  - L eye is where it started - no vision changes, saw optometry in 2025  - Has been taking Topomax 50 twice a day - hasn't helped much  - Using extra strength tylenol  twice a day every day  - More sensitive to light - No nausea  - L ear ringing comes and goes - over the last month   PERTINENT  PMH / PSH: HTN, migraine, RA  OBJECTIVE:   There were no vitals taken for this visit.  General: Well-appearing. Resting comfortably in room. CV: Normal S1/S2. No extra heart sounds. Warm and well-perfused. Pulm: Breathing comfortably on room air. CTAB. No increased WOB. Abd: Soft, non-tender, non-distended. Skin:  Warm, dry. Psych: Pleasant and appropriate.  Neuro: Alert and oriented. 5/5 strength of upper and lower extremities.  Sensation intact of extremities. CN2: no vision changes CN3,4,6: PERRLA.  CN5: Sensation intact BL CN7: Facial expressions symmetric CN8: Hearing intact BL, reports tinnitus on L  CN10: regular speech CN11: turns head against resistance CN12: tongue midline  ASSESSMENT/PLAN:   Assessment & Plan Chronic nonintractable headache, unspecified headache type Given amount of Tylenol  use, concern for rebound headache.  Overall clinical picture less aligns with migraine.  Neuroexam today reassuring. - Discussed nature of rebound headaches - Discussed tapering off of Tylenol  use - Continue Topamax  as currently prescribed Tinnitus of left ear Unclear etiology at this time.  Will refer to audiology before considering further imaging. Encounter for immunization Received annual flu shot today.   Return to clinic in 1 month for follow-up.  Damien Cassis, MD [facility]

## 2024-07-07 ENCOUNTER — Other Ambulatory Visit: Payer: Self-pay | Admitting: Internal Medicine

## 2024-07-07 DIAGNOSIS — M069 Rheumatoid arthritis, unspecified: Secondary | ICD-10-CM

## 2024-07-07 NOTE — Telephone Encounter (Signed)
 Last Fill: 05/26/2024  Next Visit: 08/22/2024  Last Visit: 05/15/2024  Dx: Rheumatoid arthritis involving multiple sites, unspecified whether rheumatoid factor present (HCC)   Current Dose per office note on 05/15/2024: tizanidine  4 mg as needed   Okay to refill Tizanidine ?

## 2024-07-16 ENCOUNTER — Other Ambulatory Visit: Payer: Self-pay | Admitting: Internal Medicine

## 2024-07-16 DIAGNOSIS — M069 Rheumatoid arthritis, unspecified: Secondary | ICD-10-CM

## 2024-07-18 ENCOUNTER — Other Ambulatory Visit: Payer: Self-pay

## 2024-07-18 MED ORDER — TOPIRAMATE 25 MG PO TABS: 50.0000 mg | ORAL_TABLET | Freq: Two times a day (BID) | ORAL | 0 refills | Status: DC

## 2024-07-18 MED ORDER — CETIRIZINE HCL 10 MG PO TABS: 10.0000 mg | ORAL_TABLET | Freq: Every day | ORAL | 9 refills | Status: AC

## 2024-07-18 NOTE — Telephone Encounter (Signed)
 Chart reviewed. Rx refilled.

## 2024-08-07 ENCOUNTER — Other Ambulatory Visit: Payer: Self-pay | Admitting: Internal Medicine

## 2024-08-07 DIAGNOSIS — M069 Rheumatoid arthritis, unspecified: Secondary | ICD-10-CM

## 2024-08-07 NOTE — Telephone Encounter (Signed)
 Last Fill: 05/15/2024  Labs: 05/15/2024 WBC 3.5, RBC  3.45, Hgb 10.9, Hct 33.4  TB Gold: 05/15/2024 Neg    Next Visit: 08/22/2024  Last Visit: 05/15/2024  DX: Rheumatoid arthritis involving multiple sites, unspecified whether rheumatoid factor present   Current Dose per office note 05/15/2024: Rinvoq  15 mg p.o. daily   Patient to update labs at upcoming appointment on 08/22/2024.   Okay to refill Rinvoq ?

## 2024-08-09 NOTE — Progress Notes (Deleted)
 Office Visit Note  Patient: Kathy [name]             Date of Birth: 03-16-1966           MRN: [id number]             PCP: Diona Perkins, MD Referring: Diona Perkins, MD Visit Date: 08/22/2024   Subjective:  No chief complaint on file.   History of Present Illness: Kathy [name] is a 58 y.o. female here for follow up for seropositive RA was on Rinvoq  15 mg daily and prednisone  5 mg daily.     Previous HPI 05/15/2024 Kathy [name] is a 58 y.o. female here for follow up for seropositive RA was on Rinvoq  15 mg daily and prednisone  5 mg daily.     She is currently out of prednisone  and voltaren  gel, having last taken prednisone  on Saturday. She continues to take Rinvoq  uninterrupted. No major flare-ups of her arthritis have occurred, and she has been doing fine. She has some increase in right ankle swelling and stiffness.   She experiences morning stiffness until about 11:00 AM, waking up around 9:00 AM. Some swelling is present in her feet, which decreases when she is off her feet.   She describes tingling in her left hand, particularly during bad weather, affecting the palm area but not the fingers. No shooting pain or numbness in the fingers is reported.   She mentions a previous visit for a headache and eye issue, which was not diagnosed as pink eye. The headache was treated and resolved, and she suspects it was viral conjunctivitis.   Previous HPI 02/14/2024 Kathy [name] is a 58 y.o. female here for follow up for seropositive RA was on Rinvoq  15 mg daily and prednisone  5 mg daily.  She has been off Rinvoq  for about 3 weeks due to needing a prescription refill and needing to follow-up for medication monitoring appointment.  She has still been on her prednisone  5 mg once daily took the last dose today.  Since being off the Rinvoq  does have increased joint pain and stiffness in a couple areas worst at the right shoulder and in her legs and knees.  The right shoulder pain has  been persistent even while on her treatment and she rates the severity as 8 out of 10 on many days and usually gets worse at nighttime.   Previous HPI 11/02/2023 Kathy [name] is a 58 y.o. female here for follow up for seropositive RA was on Rinvoq  15 mg daily and prednisone  5 mg daily. She reports symptoms are doing fair. They report intermittent adherence to Rinvoq  in the past, which led to flares of their arthritis. Recently, they have been taking Rinvoq  consistently and report no issues with the medication.   The patient describes their joint health as variable, with good and bad days. The most significant discomfort is reported in the shoulders. They deny any recent swelling in the hands. They also report a recent minor cold.   The patient also experiences intermittent numbness in their hands, which they describe as a cramping sensation affecting the entire hand. This numbness is sporadic and can wake them up at night. They also report some tenderness in the right shoulder and occasional soreness in the breast.       07/22/2023 Kathy [name] is a 58 y.o. female here for follow up for seropositive RA was on Rinvoq  15 mg daily and prednisone  5 mg daily but ran out  of her medications due to delay in her clinic follow-up.  She is off the prednisone  since about 4 days ago has noticed increased pain in her left hand but it is inconsistent worse in the mornings and coming and going during the day.  Before that feels symptoms are doing pretty well still had intermittent numbness and cramping again worse in the left hand.  Did not have very prolonged morning stiffness while she had both her medications.   04/21/2023 Kathy [name] is a 58 y.o. female here for follow up for seropositive RA was on Rinvoq  15 mg daily and prednisone  5 mg daily but ran out of her medications due to delay in her clinic follow-up.  Originally was scheduled to be seen in March.  So she has been off her medicine in the  past month.  Symptoms were doing well while on this regimen.  But now having a flare up with increased joint pain and swelling for the past week and a half. Worst in her shoulders and hands also swelling in right ankle. She also reports new complaint about left eye today with some pressure and new gray and purple spots in her vision.   10/14/22 Kathy [name] is a 58 y.o. female here for follow up for seropositive RA on rinvoq  15 mg daily and prednisone  5 mg daily. She feels symptoms are pretty stable does still have considerable pain often in her feet and and shoulders most frequently. She was in [place]  for 3 months over the interval so off her rinvoq  for a while resumed this before thanksgiving. She also feels some increased aches with the colder weather change. Taking the tizanidine  4 mg with her other medications once daily as well.   03/12/22 Kathy [name] is a 58 y.o. female here for follow up for seropositive RA on rinvoq  15 mg PO daily and prednisone  5 mg daily.  She has increased pain and stiffness in her left shoulder she notices this more with weather changes and sometimes takes prednisone  up to twice daily for this. She is off rinvoq  again about 2 weeks due to lack of refills.   12/08/2021 Kathy [name] is a 58 y.o. female here for follow up for seropositive RA on rinvoq  15 mg PO daily and prednisone  trying to taper off from 5 mg daily. She has felt improvement with resuming rinvoq  symptoms are mostly good. She is off the medicine again since about 1 week ago from running out. She takes the 5 mg prednisone  one in the morning on days where she has noticeably increased pain most often with cold and weather changes. She has shoulder pain at night also bothering her but not severely limiting any activities.    09/02/21 Kathy [name] is a 58 y.o. female here for follow up for seropositive RA on Rinvoq  15 mg p.o. daily. Recommended trying to taper off the remaining low-dose prednisone   if symptoms were well controlled since the last visit. She missed her last appointment due to bronchitis and so is off the rinvoq  for at least 2 months. This did not cause a severe flare but she definitely feels a difference and increased prednisone  use, She has been taking prednisone  5 mg daily whenever she notices more joint pain than usual often associated with weather changes. She takes 10 mg rarely sometimes in raining weather.    05/09/21 DANDREA MEDDERS is a 58 y.o. female here for follow up for seropositive rheumatoid arthritis on Rinvoq  15 mg  daily Plaquenil  200 mg daily and prednisone  5 mg daily.  After her last visit she restarted the Rinvoq  due to lapse in treatment for a period of time.  She feels her symptoms are currently well controlled on this regimen and has no complaints.   10/11/20 DENA ESPERANZA is a 58 y.o. female here to establish care of rheumatoid arthritis for which she previous saw Dr. Ziolkowski in [facility] but is looking for an office closer to her residence due to difficulty with transportation. She has a history of highly active RA for years with pain that is worst in her shoulders, hands, knees, and feet with deforming arthritis of hands and feet and has had bilateral knee replacements for arthritis. Previous rheumatologists Dr. Levitin and Dr. Ishmael. Her most recent visit with [place] office was in October with disease in exacerbation at that time and she took a prednisone  taper but has remained active taking 15mg  of prednisone  currently 10mg  AM and 5mg  PM.    No Rheumatology ROS completed.   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   Anemia 09/18/2014   Cough 02/04/2010   Rheumatoid arthritis (HCC) 09/15/2009   Generalized osteoarthritis of multiple sites 07/28/2007    Past Medical History:  Diagnosis Date   Asthma    seasonal   GERD (gastroesophageal reflux disease)    Rheumatoid arteritis (HCC)     Family History  Problem Relation Age of Onset   Asthma Mother    Diabetes Mother    Hypertension Mother    Heart disease Mother    Heart disease Father    Asthma Father    Cancer Father        lung   Lung cancer Sister    Cancer Sister        breast at 73   Lupus Sister    Cancer Brother        unknown   Cancer Nephew    Diabetes Nephew    Healthy Son    Past Surgical History:  Procedure Laterality Date   ABDOMINAL HYSTERECTOMY     TOTAL KNEE ARTHROPLASTY Right 06/18/2015   Procedure: RIGHT TOTAL KNEE ARTHROPLASTY;  Surgeon: Lonni CINDERELLA Poli, MD;  Location: [facility] OR;  Service: Orthopedics;  Laterality: Right;   TOTAL KNEE ARTHROPLASTY Left 05/11/2017   Procedure: LEFT TOTAL KNEE ARTHROPLASTY;  Surgeon: Poli Lonni CINDERELLA, MD;  Location: [facility] OR;  Service: Orthopedics;  Laterality: Left;   Social History   Social History Narrative   Not on file   Immunization History  Administered Date(s) Administered   Influenza, Seasonal, Injecte, Preservative Fre 06/28/2024   Influenza,inj,Quad PF,6+ Mos 09/19/2014, 07/23/2015, 08/04/2018, 07/08/2020, 10/30/2021   Influenza,inj,Quad PF,6-35 Mos 11/16/2017   PFIZER Comirnaty(Gray Top)Covid-19 Tri-Sucrose Vaccine 11/22/2020   PFIZER(Purple Top)SARS-COV-2 Vaccination 02/29/2020, 03/26/2020   Pneumococcal Polysaccharide-23 10/26/2002   Tdap 07/03/2016, 08/04/2018     Objective: Vital Signs: There were no vitals taken for  this visit.  Physical Exam   Musculoskeletal Exam: ***  CDAI Exam: CDAI Score: -- Patient Global: --; Provider Global: -- Swollen: --; Tender: -- Joint Exam 08/22/2024   No joint exam has been documented for this visit   There is currently no information documented on the homunculus. Go to the Rheumatology activity and complete the homunculus joint exam.  Investigation: No additional findings.  Imaging: No results found.  Recent Labs: Lab Results  Component Value Date   WBC 3.5 (L) 05/15/2024   HGB 10.9 (L) 05/15/2024   PLT 342 05/15/2024   NA 141 05/15/2024   K 4.0 05/15/2024   CL 107 05/15/2024   CO2 28 05/15/2024   GLUCOSE 81 05/15/2024   BUN 12 05/15/2024   CREATININE 0.66 05/15/2024   BILITOT 0.5 05/15/2024   ALKPHOS 75 01/08/2021   AST 22 05/15/2024   ALT 18 05/15/2024   PROT 7.5 05/15/2024   ALBUMIN 4.0 01/08/2021   CALCIUM  9.1 05/15/2024   GFRAA 123 10/11/2020   QFTBGOLDPLUS NEGATIVE 05/15/2024    Speciality Comments: No specialty comments available.  Procedures:  No procedures performed Allergies: Methotrexate  and trimetrexate, Peanut-containing drug products, Acyclovir and related, Aspirin , Egg protein-containing drug products, and Tomato   Assessment / Plan:     Visit Diagnoses: No diagnosis found.  ***  Orders: No orders of the defined types were placed in this encounter.  No orders of the defined types were placed in this encounter.    Follow-Up Instructions: No follow-ups on file.   Torell Minder M Elyas Villamor, CMA  Note - This record has been created using Animal nutritionist.  Chart creation errors have been sought, but may not always  have been located. Such creation errors do not reflect on  the standard of medical care.

## 2024-08-10 ENCOUNTER — Ambulatory Visit: Payer: Self-pay | Admitting: Internal Medicine

## 2024-08-21 ENCOUNTER — Other Ambulatory Visit: Payer: Self-pay

## 2024-08-22 ENCOUNTER — Ambulatory Visit: Admitting: Internal Medicine

## 2024-08-22 DIAGNOSIS — M069 Rheumatoid arthritis, unspecified: Secondary | ICD-10-CM

## 2024-08-22 DIAGNOSIS — K219 Gastro-esophageal reflux disease without esophagitis: Secondary | ICD-10-CM

## 2024-08-22 DIAGNOSIS — Z7952 Long term (current) use of systemic steroids: Secondary | ICD-10-CM

## 2024-08-22 DIAGNOSIS — Z79899 Other long term (current) drug therapy: Secondary | ICD-10-CM

## 2024-08-23 ENCOUNTER — Other Ambulatory Visit: Payer: Self-pay

## 2024-08-23 DIAGNOSIS — M069 Rheumatoid arthritis, unspecified: Secondary | ICD-10-CM

## 2024-08-23 MED ORDER — TIZANIDINE HCL 4 MG PO TABS: 4.0000 mg | ORAL_TABLET | Freq: Three times a day (TID) | ORAL | 0 refills | Status: DC | PRN

## 2024-08-23 NOTE — Telephone Encounter (Signed)
 Patient states she is not able to schedule at this time, due to a death in the family.  Patient will call back to reschedule.

## 2024-08-23 NOTE — Telephone Encounter (Signed)
 Please schedule patient a follow up visit. Patient due 08/15/2024. Thanks!

## 2024-08-23 NOTE — Telephone Encounter (Signed)
 Last Fill: 07/07/2024  Next Visit: Due 08/15/2024. Message sent to the front to schedule.   Last Visit: 05/15/2024  Dx: Rheumatoid arthritis involving multiple sites, unspecified whether rheumatoid factor present   Current Dose per office note on 05/15/2024: tizanidine  4 mg as needed   Okay to refill Tizanidine ?

## 2024-09-04 ENCOUNTER — Other Ambulatory Visit: Payer: Self-pay | Admitting: Internal Medicine

## 2024-09-04 DIAGNOSIS — M069 Rheumatoid arthritis, unspecified: Secondary | ICD-10-CM

## 2024-09-10 ENCOUNTER — Other Ambulatory Visit: Payer: Self-pay | Admitting: Internal Medicine

## 2024-09-10 DIAGNOSIS — M069 Rheumatoid arthritis, unspecified: Secondary | ICD-10-CM

## 2024-09-18 ENCOUNTER — Other Ambulatory Visit: Payer: Self-pay | Admitting: Internal Medicine

## 2024-09-18 DIAGNOSIS — M069 Rheumatoid arthritis, unspecified: Secondary | ICD-10-CM

## 2024-09-27 ENCOUNTER — Other Ambulatory Visit: Payer: Self-pay | Admitting: Internal Medicine

## 2024-09-27 ENCOUNTER — Telehealth: Payer: Self-pay | Admitting: Internal Medicine

## 2024-09-27 DIAGNOSIS — M069 Rheumatoid arthritis, unspecified: Secondary | ICD-10-CM

## 2024-09-27 NOTE — Telephone Encounter (Signed)
 Please schedule patient a follow up visit. Patient due 08/15/2024. Thanks!

## 2024-09-27 NOTE — Telephone Encounter (Signed)
 Pt is out of her medications and scheduled for February. Pt would like to know if she could still get a refill for them since Dr. Jeannetta is booked out.

## 2024-09-27 NOTE — Telephone Encounter (Signed)
 Last Fill: 07/07/2024  Next Visit: Due 08/15/2024. Message sent to the front to schedule.   Last Visit: 05/15/2024  Dx: Rheumatoid arthritis involving multiple sites, unspecified whether rheumatoid factor present   Current Dose per office note on 05/15/2024: tizanidine  4 mg as needed   Okay to refill Tizanidine ?

## 2024-09-27 NOTE — Telephone Encounter (Signed)
 Patient advised the Tizanidine  prescription has been worked up and sent to Dr. Jeannetta for a refill. Patient advised she will need updated lab prior to the refill on Rinvoq . Patient advised her labs were due October 2025 as last labs were July 2025. Patient expressed understanding.

## 2024-10-11 ENCOUNTER — Encounter: Payer: Self-pay | Admitting: Pharmacist

## 2024-10-11 NOTE — Telephone Encounter (Signed)
 error

## 2024-10-13 ENCOUNTER — Telehealth: Payer: Self-pay | Admitting: *Deleted

## 2024-10-13 NOTE — Telephone Encounter (Signed)
 Submitted a Prior Authorization request to [facility] MEDICAID for RINVOQ  via CoverMyMeds. Will update once we receive a response.  Key: [id number]

## 2024-10-13 NOTE — Telephone Encounter (Signed)
 Submitted a Prior Authorization request to [facility] for RINVOQ  via CoverMyMeds. Will update once we receive a response.  Key: [id number]    Per automated response: Prior Authorization Not Required  Sherry Pennant, PharmD, MPH, BCPS, CPP Clinical Pharmacist

## 2024-10-13 NOTE — Telephone Encounter (Signed)
 Received a call from [facility] PA for Rinvoq  is expiring. Requesting a new one to be submitted.

## 2024-10-13 NOTE — Telephone Encounter (Signed)
 Received fax from [facility]. Rinvoq  is approved through 12/06/2024  Auth # [id number]  Sherry Pennant, PharmD, MPH, BCPS, CPP Clinical Pharmacist

## 2024-10-16 ENCOUNTER — Other Ambulatory Visit: Payer: Self-pay

## 2024-10-16 ENCOUNTER — Other Ambulatory Visit: Payer: Self-pay | Admitting: *Deleted

## 2024-10-16 DIAGNOSIS — Z79899 Other long term (current) drug therapy: Secondary | ICD-10-CM

## 2024-10-16 DIAGNOSIS — M069 Rheumatoid arthritis, unspecified: Secondary | ICD-10-CM

## 2024-10-16 LAB — COMPREHENSIVE METABOLIC PANEL WITH GFR
AG Ratio: 1.1 (calc) (ref 1.0–2.5)
ALT: 23 U/L (ref 6–29)
AST: 33 U/L (ref 10–35)
Albumin: 4.1 g/dL (ref 3.6–5.1)
Alkaline phosphatase (APISO): 72 U/L (ref 37–153)
BUN: 12 mg/dL (ref 7–25)
CO2: 24 mmol/L (ref 20–32)
Calcium: 9.2 mg/dL (ref 8.6–10.4)
Chloride: 108 mmol/L (ref 98–110)
Creat: 0.68 mg/dL (ref 0.50–1.03)
Globulin: 3.6 g/dL (ref 1.9–3.7)
Glucose, Bld: 98 mg/dL (ref 65–99)
Potassium: 3.8 mmol/L (ref 3.5–5.3)
Sodium: 140 mmol/L (ref 135–146)
Total Bilirubin: 0.7 mg/dL (ref 0.2–1.2)
Total Protein: 7.7 g/dL (ref 6.1–8.1)
eGFR: 101 mL/min/1.73m2

## 2024-10-16 LAB — CBC WITH DIFFERENTIAL/PLATELET
Absolute Lymphocytes: 893 {cells}/uL (ref 850–3900)
Absolute Monocytes: 292 {cells}/uL (ref 200–950)
Basophils Absolute: 11 {cells}/uL (ref 0–200)
Basophils Relative: 0.3 %
Eosinophils Absolute: 40 {cells}/uL (ref 15–500)
Eosinophils Relative: 1.1 %
HCT: 32 % — ABNORMAL LOW (ref 35.9–46.0)
Hemoglobin: 10.6 g/dL — ABNORMAL LOW (ref 11.7–15.5)
MCH: 31.9 pg (ref 27.0–33.0)
MCHC: 33.1 g/dL (ref 31.6–35.4)
MCV: 96.4 fL (ref 81.4–101.7)
MPV: 10.2 fL (ref 7.5–12.5)
Monocytes Relative: 8.1 %
Neutro Abs: 2365 {cells}/uL (ref 1500–7800)
Neutrophils Relative %: 65.7 %
Platelets: 314 Thousand/uL (ref 140–400)
RBC: 3.32 Million/uL — ABNORMAL LOW (ref 3.80–5.10)
RDW: 12.4 % (ref 11.0–15.0)
Total Lymphocyte: 24.8 %
WBC: 3.6 Thousand/uL — ABNORMAL LOW (ref 3.8–10.8)

## 2024-10-16 NOTE — Telephone Encounter (Signed)
 Refill request received via fax from [facility] for Prednisone   Last Fill: 05/15/2024  Next Visit: 12/06/2024  Last Visit: 05/15/2024  Dx: Rheumatoid arthritis involving multiple sites, unspecified whether rheumatoid factor present   Current Dose per office note on 05/15/2024: prednisone  5 mg daily   Okay to refill Prednisone ?

## 2024-10-17 ENCOUNTER — Ambulatory Visit: Payer: Self-pay | Admitting: Rheumatology

## 2024-10-17 ENCOUNTER — Other Ambulatory Visit: Payer: Self-pay

## 2024-10-17 ENCOUNTER — Other Ambulatory Visit: Payer: Self-pay | Admitting: *Deleted

## 2024-10-17 DIAGNOSIS — I10 Essential (primary) hypertension: Secondary | ICD-10-CM

## 2024-10-17 DIAGNOSIS — M069 Rheumatoid arthritis, unspecified: Secondary | ICD-10-CM

## 2024-10-17 MED ORDER — BUSPIRONE HCL 10 MG PO TABS: 10.0000 mg | ORAL_TABLET | Freq: Two times a day (BID) | ORAL | 6 refills | Status: DC

## 2024-10-17 MED ORDER — AMLODIPINE BESYLATE 10 MG PO TABS: 10.0000 mg | ORAL_TABLET | Freq: Every day | ORAL | 3 refills | Status: AC

## 2024-10-17 NOTE — Telephone Encounter (Signed)
 Patient requested refills of prednisone  and rinvoq .   Rinvoq  goes to [facility] and prednisone  to [facility] [address].   Last Fill: 05/15/2024 prednisone , 08/10/2024 rinvoq   Labs: 10/16/2024 White cell count is low and stable. Hemoglobin is low and stable. CMP is normal.   TB Gold: 05/15/2024 negative    Next Visit: 12/06/2024  Last Visit: 05/15/2024  IK:Myzlfjunpi arthritis involving multiple sites, unspecified whether rheumatoid factor present   Current Dose per office note on 05/15/2024: Rinvoq  15 mg p.o. daily,  prednisone  5 mg daily   Okay to refill Rinvoq  and prednisone ?

## 2024-10-17 NOTE — Progress Notes (Signed)
 White cell count is low and stable.  Hemoglobin is low and stable.  CMP is normal.

## 2024-10-18 MED ORDER — PREDNISONE 5 MG PO TABS: 5.0000 mg | ORAL_TABLET | Freq: Every day | ORAL | 0 refills | Status: AC

## 2024-10-18 MED ORDER — RINVOQ 15 MG PO TB24: ORAL_TABLET | ORAL | 0 refills | Status: DC

## 2024-10-23 ENCOUNTER — Other Ambulatory Visit: Payer: Self-pay | Admitting: Internal Medicine

## 2024-10-23 DIAGNOSIS — M069 Rheumatoid arthritis, unspecified: Secondary | ICD-10-CM

## 2024-10-23 NOTE — Telephone Encounter (Signed)
 Last Fill: 09/28/2024  Next Visit: 12/06/2024  Last Visit: 05/15/2024  Dx:  Rheumatoid arthritis involving multiple sites, unspecified whether rheumatoid factor present   Current Dose per office note on 05/15/2024: tizanidine  4 mg as needed   Okay to refill Tizanidine ?

## 2024-10-25 ENCOUNTER — Telehealth: Payer: Self-pay

## 2024-10-25 NOTE — Telephone Encounter (Signed)
 Received fax from [facility] stating that pt's current PA is set to expire on 10/26/2024.  Attempted to submit another PA to [facility], however it was once again cancelled out stating that a PA is still not required.  Key: [id number]

## 2024-10-26 ENCOUNTER — Other Ambulatory Visit: Payer: Self-pay | Admitting: Family Medicine

## 2024-10-27 ENCOUNTER — Other Ambulatory Visit: Payer: Self-pay | Admitting: Internal Medicine

## 2024-10-27 DIAGNOSIS — M069 Rheumatoid arthritis, unspecified: Secondary | ICD-10-CM

## 2024-10-30 ENCOUNTER — Other Ambulatory Visit: Payer: Self-pay | Admitting: Family Medicine

## 2024-10-30 MED ORDER — TOPIRAMATE 50 MG PO TABS: 50.0000 mg | ORAL_TABLET | Freq: Two times a day (BID) | ORAL | 0 refills | Status: AC

## 2024-10-30 NOTE — Progress Notes (Signed)
 Corrected topomax dosing on rx refill to 50mg  BID.

## 2024-11-10 ENCOUNTER — Other Ambulatory Visit: Payer: Self-pay | Admitting: Internal Medicine

## 2024-11-10 DIAGNOSIS — M069 Rheumatoid arthritis, unspecified: Secondary | ICD-10-CM

## 2024-11-10 MED ORDER — RINVOQ 15 MG PO TB24: ORAL_TABLET | ORAL | 0 refills | Status: AC

## 2024-11-10 NOTE — Telephone Encounter (Signed)
 Last Fill: 10/18/2024  Labs: 10/16/2024 White cell count is low and stable. Hemoglobin is low and stable. CMP is normal.   TB Gold: 05/15/2024 negative    Next Visit: 12/06/2024  Last Visit: 05/15/2024  DX: Rheumatoid arthritis involving multiple sites, unspecified whether rheumatoid factor present   Current Dose per office note on 05/15/2024: Rinvoq  15 mg p.o. daily   90 day supply was sent to the pharmacy on 10/18/2024, but patient states that [facility] advised her that the prescription is expired so a new one is needed.   Okay to refill Rinvoq ?

## 2024-11-10 NOTE — Telephone Encounter (Signed)
 Patient contacted the office to request a medication refill.   1. Name of Medication: Rinvoq   2. How are you currently taking this medication (dosage and times per day)? 1 tablet daily   3. What pharmacy would you like for that to be sent to?  [facility]

## 2024-11-12 ENCOUNTER — Other Ambulatory Visit: Payer: Self-pay | Admitting: Family Medicine

## 2024-11-13 ENCOUNTER — Telehealth: Payer: Self-pay | Admitting: Internal Medicine

## 2024-11-13 NOTE — Telephone Encounter (Signed)
 Submitted an URGENT Prior Authorization request to [facility] MEDICAID for RINVOQ  via CoverMyMeds. Will update once we receive a response.  Key: [id number]

## 2024-11-13 NOTE — Telephone Encounter (Signed)
 A prescription of Rinvoq  was sent to the pharmacy on 11/10/2024. Contacted [facility] and spoke with Joen. Joen states they have received the prescription for the patient and that is would take 5-8 days to process. Joen then states that it appears they are receiving a rejection claim from the patient's insurance. Joen states the patient Rinvoq  will need a PA. Myles Joen I would send a message to our pharmacy team. Please advise.

## 2024-11-13 NOTE — Telephone Encounter (Signed)
 Patient called checking the status of her Rinvoq  prescription.  Patient states she just got off the phone with [facility] and they are still waiting on the prescription to be sent in.

## 2024-11-14 NOTE — Telephone Encounter (Signed)
 Chart reviewed. Rx refilled.

## 2024-11-14 NOTE — Telephone Encounter (Signed)
 Received notification from [facility] MEDICAID regarding a prior authorization for RINVOQ . Authorization has been APPROVED from 11/13/2024 to 11/13/2025. Approval letter sent to scan center.  Forwarded approval to [facility]  Authorization # [id number]  Sherry Pennant, PharmD, MPH, BCPS, CPP Clinical Pharmacist

## 2024-11-17 ENCOUNTER — Other Ambulatory Visit: Payer: Self-pay

## 2024-11-17 DIAGNOSIS — R059 Cough, unspecified: Secondary | ICD-10-CM

## 2024-11-17 MED ORDER — FLUTICASONE PROPIONATE 50 MCG/ACT NA SUSP: 1.0000 | Freq: Every day | NASAL | 6 refills | Status: AC | PRN

## 2024-11-28 NOTE — Assessment & Plan Note (Signed)
 SABRA

## 2024-11-28 NOTE — Assessment & Plan Note (Signed)
 Kathy [name]

## 2024-11-29 ENCOUNTER — Other Ambulatory Visit: Payer: Self-pay | Admitting: Internal Medicine

## 2024-11-29 DIAGNOSIS — M069 Rheumatoid arthritis, unspecified: Secondary | ICD-10-CM

## 2024-12-01 ENCOUNTER — Telehealth: Payer: Self-pay | Admitting: Internal Medicine

## 2024-12-01 NOTE — Telephone Encounter (Signed)
 Pt called requesting to speak with pharmacy about her medication.

## 2024-12-01 NOTE — Telephone Encounter (Signed)
 Pt contacted clinic stating that [facility] is telling her that they are waiting for approval from our office before they can refill her medication. Per med list, a 90-day supply of Rinvoq  was sent in to [facility] on 11/10/24 so (assuming that she had it filled in January) she should still have at least two fills on file. We also already obtained a PA for her medication from 11/13/24-11/13/25, so this should also not be the approval that they are waiting on. Will contact [facility] to find out what is really going on.   Per [facility] rep the medication is ready to be scheduled for shipement, the pt just never returned the call to [facility] to complete the process. Attempted to call pt to discuss, was sent to VM after two rings. Left message providing update, nothing further should be required at this time.

## 2024-12-06 ENCOUNTER — Ambulatory Visit: Admitting: Internal Medicine

## 2024-12-06 DIAGNOSIS — Z79899 Other long term (current) drug therapy: Secondary | ICD-10-CM

## 2024-12-06 DIAGNOSIS — Z7952 Long term (current) use of systemic steroids: Secondary | ICD-10-CM

## 2024-12-06 DIAGNOSIS — K219 Gastro-esophageal reflux disease without esophagitis: Secondary | ICD-10-CM

## 2024-12-06 DIAGNOSIS — M069 Rheumatoid arthritis, unspecified: Secondary | ICD-10-CM
